# Patient Record
Sex: Female | Born: 1995 | Race: Black or African American | Hispanic: No | Marital: Single
Health system: Southern US, Community
[De-identification: ages and names within clinical notes are randomized; demographics above are authoritative.]

## PROBLEM LIST (undated history)

## (undated) ENCOUNTER — Inpatient Hospital Stay (HOSPITAL_COMMUNITY): Payer: Self-pay

## (undated) ENCOUNTER — Emergency Department (HOSPITAL_COMMUNITY): Discharge status: Absent without leave | Payer: Medicaid Other

## (undated) DIAGNOSIS — F329 Major depressive disorder, single episode, unspecified: Secondary | ICD-10-CM

## (undated) DIAGNOSIS — F32A Depression, unspecified: Secondary | ICD-10-CM

## (undated) DIAGNOSIS — D649 Anemia, unspecified: Secondary | ICD-10-CM

## (undated) DIAGNOSIS — Z789 Other specified health status: Secondary | ICD-10-CM

## (undated) HISTORY — PX: FRACTURE SURGERY: SHX138

---
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
## 2014-07-25 ENCOUNTER — Encounter (HOSPITAL_COMMUNITY): Payer: Self-pay | Admitting: *Deleted

## 2014-08-22 ENCOUNTER — Encounter (HOSPITAL_COMMUNITY): Payer: Self-pay | Admitting: *Deleted

## 2015-08-07 ENCOUNTER — Inpatient Hospital Stay (HOSPITAL_COMMUNITY)
Admission: AD | Admit: 2015-08-07 | Discharge: 2015-08-07 | Disposition: A | Discharge status: Home / Self Care | Payer: Self-pay | Source: Ambulatory Visit | Attending: Family Medicine | Admitting: Family Medicine

## 2015-08-07 ENCOUNTER — Encounter (HOSPITAL_COMMUNITY): Payer: Self-pay | Admitting: *Deleted

## 2015-08-07 DIAGNOSIS — Z5320 Procedure and treatment not carried out because of patient's decision for unspecified reasons: Secondary | ICD-10-CM | POA: Insufficient documentation

## 2015-08-07 LAB — URINALYSIS, ROUTINE W REFLEX MICROSCOPIC
Bilirubin Urine: NEGATIVE
GLUCOSE, UA: NEGATIVE mg/dL
Ketones, ur: 15 mg/dL — AB
NITRITE: NEGATIVE
PH: 6 (ref 5.0–8.0)
Protein, ur: NEGATIVE mg/dL
Specific Gravity, Urine: >1.03 — ABNORMAL HIGH (ref 1.005–1.030)
Urobilinogen, UA: 0.2 mg/dL (ref 0.0–1.0)

## 2015-08-07 LAB — URINE MICROSCOPIC-ADD ON

## 2015-08-07 LAB — POCT PREGNANCY, URINE: Preg Test, Ur: POSITIVE — AB

## 2015-08-07 NOTE — MAU Note (Signed)
Pos HPT yesterday, had been spotting & vomiting before taking test.  Spotting stopped yesterday, denies pain today.  Vomiting continues.

## 2015-08-07 NOTE — MAU Note (Signed)
Pt not in lobby, did not inform staff she was leaving. 

## 2015-08-07 NOTE — MAU Note (Signed)
Pt called, not in lobby 

## 2015-08-07 NOTE — MAU Note (Signed)
Pt not in lobby.  

## 2015-08-15 ENCOUNTER — Encounter (HOSPITAL_COMMUNITY): Payer: Self-pay | Admitting: Emergency Medicine

## 2015-08-15 ENCOUNTER — Emergency Department (HOSPITAL_COMMUNITY): Payer: Self-pay

## 2015-08-15 ENCOUNTER — Emergency Department (HOSPITAL_COMMUNITY)
Admission: EM | Admit: 2015-08-15 | Discharge: 2015-08-15 | Disposition: A | Discharge status: Home / Self Care | Payer: Self-pay | Attending: Emergency Medicine | Admitting: Emergency Medicine

## 2015-08-15 DIAGNOSIS — O9989 Other specified diseases and conditions complicating pregnancy, childbirth and the puerperium: Secondary | ICD-10-CM | POA: Insufficient documentation

## 2015-08-15 DIAGNOSIS — Z3A01 Less than 8 weeks gestation of pregnancy: Secondary | ICD-10-CM | POA: Insufficient documentation

## 2015-08-15 DIAGNOSIS — O210 Mild hyperemesis gravidarum: Secondary | ICD-10-CM | POA: Insufficient documentation

## 2015-08-15 DIAGNOSIS — R197 Diarrhea, unspecified: Secondary | ICD-10-CM | POA: Insufficient documentation

## 2015-08-15 DIAGNOSIS — O26899 Other specified pregnancy related conditions, unspecified trimester: Secondary | ICD-10-CM

## 2015-08-15 DIAGNOSIS — R109 Unspecified abdominal pain: Secondary | ICD-10-CM

## 2015-08-15 DIAGNOSIS — Z3490 Encounter for supervision of normal pregnancy, unspecified, unspecified trimester: Secondary | ICD-10-CM

## 2015-08-15 LAB — COMPREHENSIVE METABOLIC PANEL
ALT: 12 U/L — AB (ref 14–54)
AST: 20 U/L (ref 15–41)
Albumin: 4 g/dL (ref 3.5–5.0)
Alkaline Phosphatase: 56 U/L (ref 38–126)
Anion gap: 11 (ref 5–15)
BUN: <5 mg/dL — ABNORMAL LOW (ref 6–20)
CALCIUM: 9.8 mg/dL (ref 8.9–10.3)
CO2: 22 mmol/L (ref 22–32)
Chloride: 104 mmol/L (ref 101–111)
Creatinine, Ser: 0.57 mg/dL (ref 0.44–1.00)
GFR calc Af Amer: >60 mL/min (ref >60)
GLUCOSE: 111 mg/dL — AB (ref 65–99)
Potassium: 3.9 mmol/L (ref 3.5–5.1)
Sodium: 137 mmol/L (ref 135–145)
Total Bilirubin: 0.3 mg/dL (ref 0.3–1.2)
Total Protein: 7.5 g/dL (ref 6.5–8.1)

## 2015-08-15 LAB — CBC
HEMATOCRIT: 37.6 % (ref 36.0–46.0)
Hemoglobin: 12.7 g/dL (ref 12.0–15.0)
MCH: 29.3 pg (ref 26.0–34.0)
MCHC: 33.8 g/dL (ref 30.0–36.0)
MCV: 86.6 fL (ref 78.0–100.0)
Platelets: 413 10*3/uL — ABNORMAL HIGH (ref 150–400)
RBC: 4.34 MIL/uL (ref 3.87–5.11)
RDW: 13 % (ref 11.5–15.5)
WBC: 14.8 10*3/uL — ABNORMAL HIGH (ref 4.0–10.5)

## 2015-08-15 LAB — URINALYSIS, ROUTINE W REFLEX MICROSCOPIC
Bilirubin Urine: NEGATIVE
GLUCOSE, UA: NEGATIVE mg/dL
HGB URINE DIPSTICK: NEGATIVE
Ketones, ur: >80 mg/dL — AB
Leukocytes, UA: NEGATIVE
Nitrite: NEGATIVE
PH: 6 (ref 5.0–8.0)
Protein, ur: 30 mg/dL — AB
SPECIFIC GRAVITY, URINE: 1.031 — AB (ref 1.005–1.030)
Urobilinogen, UA: 0.2 mg/dL (ref 0.0–1.0)

## 2015-08-15 LAB — ABO/RH: ABO/RH(D): O POS

## 2015-08-15 LAB — I-STAT BETA HCG BLOOD, ED (MC, WL, AP ONLY): I-stat hCG, quantitative: >2000 m[IU]/mL — ABNORMAL HIGH (ref <5)

## 2015-08-15 LAB — URINE MICROSCOPIC-ADD ON

## 2015-08-15 LAB — LIPASE, BLOOD: Lipase: 22 U/L (ref 11–51)

## 2015-08-15 MED ORDER — SODIUM CHLORIDE 0.9 % IV BOLUS (SEPSIS)
1000.0000 mL | Freq: Once | INTRAVENOUS | Status: AC
  Administered 2015-08-15: 1000 mL via INTRAVENOUS

## 2015-08-15 MED ORDER — PROCHLORPERAZINE EDISYLATE 5 MG/ML IJ SOLN
10.0000 mg | Freq: Once | INTRAMUSCULAR | Status: AC
Start: 2015-08-15 — End: 2015-08-15
  Administered 2015-08-15: 10 mg via INTRAVENOUS
  Filled 2015-08-15: qty 2

## 2015-08-15 MED ORDER — PRENATAL PLUS 27-1 MG PO TABS: 1.0000 | ORAL_TABLET | Freq: Every day | ORAL | Status: DC

## 2015-08-15 MED ORDER — DIPHENHYDRAMINE HCL 50 MG/ML IJ SOLN
50.0000 mg | Freq: Once | INTRAMUSCULAR | Status: AC
  Administered 2015-08-15: 50 mg via INTRAVENOUS
  Filled 2015-08-15: qty 1

## 2015-08-15 NOTE — ED Notes (Signed)
Pt sts abd pain cramping with N/V/D starting today

## 2015-08-15 NOTE — ED Provider Notes (Signed)
CSN: [id number]     Arrival date & time 08/15/15  1507 History   First MD Initiated Contact with Patient 08/15/15 1620     Chief Complaint  Patient presents with  . Emesis  . Diarrhea   HPI  Patient process emergency department for 1 day of emesis and nausea with associated loose stool. Patient states she has been pregnant 2 other times with one preterm delivery that is currently living.  Patient states she has no other pertinent medical history no cancers this time no abdominal pain no back pain or flank pain no chest pain no shortness of breath patient denies fever and chills. Patient sees Dr. Clearance CootsHarper and Dr. Gaynell FaceMarshall for 2 prior pregnancies. Patient states she has tried by mouth fluids however this has been unable to stop emesis. Emesis is described as without blood or bile. And contains stomach contents. Patient had no elevated blood pressure or severe emesis episodes during prior pregnancies.  Past Medical History  Diagnosis Date  . No pertinent past medical history   . Medical history non-contributory    Past Surgical History  Procedure Laterality Date  . Fracture surgery     History reviewed. No pertinent family history. Social History  Substance Use Topics  . Smoking status: Never Smoker   . Smokeless tobacco: Never Used  . Alcohol Use: No   OB History    Gravida Para Term Preterm AB TAB SAB Ectopic Multiple Living   3 1  1      1      Review of Systems Endorses only nausea and emesis. Complete Review of Systems obtained and all other systems negative.   Allergies  Review of patient's allergies indicates no known allergies.  Home Medications   Prior to Admission medications   Medication Sig Start Date End Date Taking? Authorizing Provider  prenatal vitamin w/FE, FA (PRENATAL 1 + 1) 27-1 MG TABS tablet Take 1 tablet by mouth daily. 08/15/15   Jonette EvaBrad Eavan Gonterman, MD  promethazine (PHENERGAN) 12.5 MG tablet Take 1 tablet (12.5 mg total) by mouth every 6 (six) hours as needed  for nausea or vomiting. 09/19/13   Deirdre C Poe, CNM   BP 110/59 mmHg  Pulse 76  Temp(Src) 98.2 F (36.8 C) (Oral)  Resp 16  SpO2 98%  LMP 07/01/2015 Physical Exam  Constitutional: She is oriented to person, place, and time. She appears well-developed and well-nourished.  Non-toxic appearance. She does not appear ill. No distress.  HENT:  Head: Normocephalic and atraumatic.  Right Ear: External ear normal.  Left Ear: External ear normal.  Mouth/Throat: Oropharynx is clear and moist.  Eyes: Pupils are equal, round, and reactive to light. No scleral icterus.  Neck: Normal range of motion. Neck supple. No tracheal deviation present.  Cardiovascular: Normal heart sounds and intact distal pulses.   No murmur heard. Pulmonary/Chest: Effort normal and breath sounds normal. No stridor. No respiratory distress. She has no wheezes. She has no rales.  Abdominal: Soft. Bowel sounds are normal. She exhibits no distension. There is no tenderness. There is no rebound and no guarding.  Musculoskeletal: Normal range of motion.  Neurological: She is alert and oriented to person, place, and time. She has normal strength and normal reflexes. No cranial nerve deficit or sensory deficit.  Skin: Skin is warm and dry. No pallor.  Psychiatric: She has a normal mood and affect. Her behavior is normal.   ED Course  Procedures (including critical care time) Labs Review Labs Reviewed  COMPREHENSIVE METABOLIC  PANEL - Abnormal; Notable for the following:    Glucose, Bld 111 (*)    BUN <5 (*)    ALT 12 (*)    All other components within normal limits  CBC - Abnormal; Notable for the following:    WBC 14.8 (*)    Platelets 413 (*)    All other components within normal limits  URINALYSIS, ROUTINE W REFLEX MICROSCOPIC (NOT AT [facility]) - Abnormal; Notable for the following:    APPearance CLOUDY (*)    Specific Gravity, Urine 1.031 (*)    Ketones, ur >80 (*)    Protein, ur 30 (*)    All other components within  normal limits  I-STAT BETA HCG BLOOD, ED ([facility], [facility], [facility] ONLY) - Abnormal; Notable for the following:    I-stat hCG, quantitative >2000.0 (*)    All other components within normal limits  LIPASE, BLOOD  URINE MICROSCOPIC-ADD ON  ABO/RH    Imaging Review US Ob Comp Less 14 Wks  08/15/2015  CLINICAL DATA:  Abdominal cramping affecting pregnancy. Patient is 5 weeks and 3 days pregnant based on her last menstrual period. EXAM: OBSTETRIC <14 WK [place] AND TRANSVAGINAL OB US TECHNIQUE: Both transabdominal and transvaginal ultrasound examinations were performed for complete evaluation of the gestation as well as the maternal uterus, adnexal regions, and pelvic cul-de-sac. Transvaginal technique was performed to assess early pregnancy. COMPARISON:  None. FINDINGS: Intrauterine gestational sac: Visualized/normal in shape. Yolk sac:  Yes Embryo:  Yes Cardiac Activity: Yes Heart Rate: 144  bpm CRL:  9.9 mm  mm   7 w   0 d                  [place] EDC: 04/02/2016 Maternal uterus/adnexae: Small subchronic hemorrhage measuring 11 x 5 x 9 mm. No uterine masses. Cervix is unremarkable. Normal ovaries. Trace pelvic free fluid. IMPRESSION: 1. Single live anterior pregnancy with a measured gestational age of [redacted] weeks. 2. Small subchronic hemorrhage.  No other pregnancy complication. 3. Normal ovaries and adnexa. Electronically Signed   By: Amie Portland M.D.   On: 08/15/2015 18:50   US Ob Transvaginal  08/15/2015  CLINICAL DATA:  Abdominal cramping affecting pregnancy. Patient is 5 weeks and 3 days pregnant based on her last menstrual period. EXAM: OBSTETRIC <14 WK [place] AND TRANSVAGINAL OB US TECHNIQUE: Both transabdominal and transvaginal ultrasound examinations were performed for complete evaluation of the gestation as well as the maternal uterus, adnexal regions, and pelvic cul-de-sac. Transvaginal technique was performed to assess early pregnancy. COMPARISON:  None. FINDINGS: Intrauterine gestational sac: Visualized/normal in  shape. Yolk sac:  Yes Embryo:  Yes Cardiac Activity: Yes Heart Rate: 144  bpm CRL:  9.9 mm  mm   7 w   0 d                  [place] EDC: 04/02/2016 Maternal uterus/adnexae: Small subchronic hemorrhage measuring 11 x 5 x 9 mm. No uterine masses. Cervix is unremarkable. Normal ovaries. Trace pelvic free fluid. IMPRESSION: 1. Single live anterior pregnancy with a measured gestational age of [redacted] weeks. 2. Small subchronic hemorrhage.  No other pregnancy complication. 3. Normal ovaries and adnexa. Electronically Signed   By: Amie Portland M.D.   On: 08/15/2015 18:50   I have personally reviewed and evaluated these images and lab results as part of my medical decision-making.   EKG Interpretation None      MDM   Sandra Matthews is a 19 y.o. female  with H&P as above. ED clinical course as follows:  patient is afebrile and non-toxic appearing. VS HDS. No DM hx therefore DKA not likely leukocytosis likely stress response to repeated emesis.   Exam without guarding/rebound therefore do not suspect appendicitis or diverticulitis.  No vaginal d/c.  Unlikely PID or TOA given reassuring H&P. Emesis visualized which has no bloody or bilious components.  Urine pregnancy positive. Patient's last menstrual period was 07/01/2015.   Pelvic US performed for location of pregnancy and dating. It confirms intrauterine pregnancy therefore do not suspect ectopic given no stimulation therapy.  Shows single live pregnancy with a measured gestational age of [redacted] weeks. Small subchronic hemorrhage. No other pregnancy complication. Normal ovaries and adnexa.  UA without evidence of infection therefore do not suspect pyelonephritis.  Symptomatic treatment provided with IV compazine and benadryl which resulted in complete resolution of symptoms. Patient well appearing without further endorsements.  Clinical Impression:  1. Pregnancy   2. Cramping affecting pregnancy, antepartum    Disposition:  I explained the  differential diagnoses along with likely diagnosis and have given explicit precautions to return to the ER including any other new or worsening symptoms. The patient understands and I both nursing and I confirmed there are no further concerns or questions. Discharge instructions concerning symptomatic care and follow up have been given. Patient told she must seek immediate prenatal care and to avoid all toxins that could prove harmful to pregnancy especially alcohol or illicit drugs. Told to discuss all home medications with their prescribing providers. Given Rx for prenatal vitamins. The patient is STABLE and is discharged to home in good condition.  Patient care discussed with Dr. Denton Lank, who oversaw their evaluation & treatment & voiced agreement. House Officer: Jonette Eva, MD, Emergency Medicine.   Jonette Eva, MD 08/16/15 1610  Cathren Laine, MD 08/16/15 [phone]

## 2015-09-21 ENCOUNTER — Encounter: Payer: Self-pay | Admitting: Obstetrics

## 2015-09-22 ENCOUNTER — Other Ambulatory Visit: Payer: Self-pay | Admitting: Certified Nurse Midwife

## 2015-09-28 ENCOUNTER — Encounter (HOSPITAL_COMMUNITY): Payer: Self-pay

## 2015-09-28 ENCOUNTER — Inpatient Hospital Stay (HOSPITAL_COMMUNITY)
Admission: AD | Admit: 2015-09-28 | Discharge: 2015-09-28 | Disposition: A | Discharge status: Home / Self Care | Payer: Medicaid Other | Source: Ambulatory Visit | Attending: Obstetrics & Gynecology | Admitting: Obstetrics & Gynecology

## 2015-09-28 DIAGNOSIS — O219 Vomiting of pregnancy, unspecified: Secondary | ICD-10-CM

## 2015-09-28 DIAGNOSIS — Z3A12 12 weeks gestation of pregnancy: Secondary | ICD-10-CM | POA: Diagnosis not present

## 2015-09-28 DIAGNOSIS — O210 Mild hyperemesis gravidarum: Secondary | ICD-10-CM | POA: Insufficient documentation

## 2015-09-28 LAB — URINALYSIS, ROUTINE W REFLEX MICROSCOPIC
Glucose, UA: NEGATIVE mg/dL
Hgb urine dipstick: NEGATIVE
Ketones, ur: >80 mg/dL — AB
LEUKOCYTES UA: NEGATIVE
NITRITE: NEGATIVE
Protein, ur: 30 mg/dL — AB
pH: 6 (ref 5.0–8.0)

## 2015-09-28 LAB — URINE MICROSCOPIC-ADD ON

## 2015-09-28 MED ORDER — PROMETHAZINE HCL 25 MG PO TABS: 12.5000 mg | ORAL_TABLET | Freq: Four times a day (QID) | ORAL | Status: DC | PRN

## 2015-09-28 MED ORDER — LACTATED RINGERS IV BOLUS (SEPSIS)
1000.0000 mL | Freq: Once | INTRAVENOUS | Status: AC
  Administered 2015-09-28: 1000 mL via INTRAVENOUS

## 2015-09-28 MED ORDER — GLYCOPYRROLATE 1 MG PO TABS: 1.0000 mg | ORAL_TABLET | Freq: Three times a day (TID) | ORAL | Status: DC

## 2015-09-28 MED ORDER — PROMETHAZINE HCL 25 MG/ML IJ SOLN
25.0000 mg | Freq: Once | INTRAVENOUS | Status: AC
  Administered 2015-09-28: 25 mg via INTRAVENOUS
  Filled 2015-09-28: qty 1

## 2015-09-28 MED ORDER — GLYCOPYRROLATE 1 MG PO TABS
1.0000 mg | ORAL_TABLET | Freq: Once | ORAL | Status: AC
  Administered 2015-09-28: 1 mg via ORAL
  Filled 2015-09-28: qty 1

## 2015-09-28 NOTE — MAU Note (Signed)
Pt reports she has been vomiting for the last 4-5 days, unable to keep anything down. Also having lower abd pain off/on.

## 2015-09-28 NOTE — MAU Note (Signed)
PO fluids tolerated well, pt asking to be discharged. Discharge instructions given to patient-pt verbalizes understanding

## 2015-09-28 NOTE — MAU Note (Signed)
Pt states nausea much improved after IVF's with phenergan. Ginger ale given.

## 2015-09-28 NOTE — MAU Provider Note (Signed)
History     CSN: [id number]  Arrival date and time: 09/28/15 0125   First Provider Initiated Contact with Patient 09/28/15 0154      Chief Complaint  Patient presents with  . Emesis During Pregnancy   HPI Comments: Sandra Matthews is a 19 y.o. G2P0101 at 670w5d who presents today with nausea and vomiting. She states that for the last week she has been vomiting "all day every day". She states that she has had increased spitting. She has not tried any medications at this time.   Emesis  This is a new problem. The problem occurs more than 10 times per day. The problem has been unchanged. The emesis has an appearance of stomach contents. There has been no fever. Associated symptoms include diarrhea (2x day. Soft stools. ). Pertinent negatives include no abdominal pain, chills or fever. Risk factors: pregnancy  She has tried nothing for the symptoms.     Past Medical History  Diagnosis Date  . No pertinent past medical history   . Medical history non-contributory     Past Surgical History  Procedure Laterality Date  . Fracture surgery      History reviewed. No pertinent family history.  Social History  Substance Use Topics  . Smoking status: Never Smoker   . Smokeless tobacco: Never Used  . Alcohol Use: No    Allergies: No Known Allergies  Prescriptions prior to admission  Medication Sig Dispense Refill Last Dose  . prenatal vitamin w/FE, FA (PRENATAL 1 + 1) 27-1 MG TABS tablet Take 1 tablet by mouth daily. 30 each 0 Past Month at Unknown time  . promethazine (PHENERGAN) 12.5 MG tablet Take 1 tablet (12.5 mg total) by mouth every 6 (six) hours as needed for nausea or vomiting. 30 tablet 0     Review of Systems  Constitutional: Negative for fever and chills.  Gastrointestinal: Positive for nausea, vomiting and diarrhea (2x day. Soft stools. ). Negative for abdominal pain and constipation.  Genitourinary: Negative for dysuria, urgency and frequency.   Physical Exam    Blood pressure 126/72, pulse 98, temperature 98.8 F (37.1 C), temperature source Oral, resp. rate 16, height 5\' 1"  (1.549 m), weight 55.339 kg (122 lb), last menstrual period 07/01/2015, SpO2 97 %, unknown if currently breastfeeding.  Physical Exam  Nursing note and vitals reviewed. Constitutional: She is oriented to person, place, and time. She appears well-developed and well-nourished. No distress.  HENT:  Head: Normocephalic.  Cardiovascular: Normal rate.   Respiratory: Effort normal.  GI: Soft. There is no tenderness.  Neurological: She is oriented to person, place, and time.  Skin: Skin is warm.  Psychiatric: She has a normal mood and affect.   FHT: 154 with doppler    Results for orders placed or performed during the hospital encounter of 09/28/15 (from the past 24 hour(s))  Urinalysis, Routine w reflex microscopic (not at [facility])     Status: Abnormal   Collection Time: 09/28/15  1:25 AM  Result Value Ref Range   Color, Urine YELLOW YELLOW   APPearance CLEAR CLEAR   Specific Gravity, Urine >1.030 (H) 1.005 - 1.030   pH 6.0 5.0 - 8.0   Glucose, UA NEGATIVE NEGATIVE mg/dL   Hgb urine dipstick NEGATIVE NEGATIVE   Bilirubin Urine SMALL (A) NEGATIVE   Ketones, ur >80 (A) NEGATIVE mg/dL   Protein, ur 30 (A) NEGATIVE mg/dL   Nitrite NEGATIVE NEGATIVE   Leukocytes, UA NEGATIVE NEGATIVE  Urine microscopic-add on  Status: Abnormal   Collection Time: 09/28/15  1:25 AM  Result Value Ref Range   Squamous Epithelial / LPF 0-5 (A) NONE SEEN   WBC, UA 0-5 0 - 5 WBC/hpf   RBC / HPF 0-5 0 - 5 RBC/hpf   Bacteria, UA FEW (A) NONE SEEN   Urine-Other MUCOUS PRESENT     MAU Course  Procedures  MDM Patient had 1L of D5LR with phenergan, and 1L of LR. She has also had a dose or robinul. She reports feeling better, and is tolerating PO foods and fluids at this time.   Assessment and Plan   1. Nausea/vomiting in pregnancy   2. [redacted] weeks gestation of pregnancy    DC home Comfort  measures reviewed  1st Trimester precautions  RX: phenergan PRN #[id number] with 1 RF, Robinul TID #[id number]  Return to MAU as needed FU with OB as planned  Follow-up Information    Schedule an appointment as soon as possible for a visit with HARPER,CHARLES A, MD.   Specialty:  Obstetrics and Gynecology   Contact information:   [address] [place] [address] [phone]        Tawnya Crook 09/28/2015, 1:56 AM

## 2015-09-28 NOTE — Discharge Instructions (Signed)

## 2015-10-22 NOTE — L&D Delivery Note (Signed)
Delivery Note At 2:51 PM a viable female was delivered via Vaginal, Spontaneous Delivery (Presentation: LOA ; Occiput Anterior).  APGAR: 9, 9; weight  .   Placenta status: Intact, Spontaneous.  Cord: 3 vessels with the following complications: None.  Cord pH: none  Anesthesia: None  Episiotomy: None Lacerations: None Suture Repair: None Est. Blood Loss (mL): 350  Mom to postpartum floor.  Baby to Couplet care / Skin to Skin.  Ariela Mochizuki A 03/24/2016, 3:49 PM

## 2015-11-16 ENCOUNTER — Ambulatory Visit (INDEPENDENT_AMBULATORY_CARE_PROVIDER_SITE_OTHER): Payer: Medicaid Other | Admitting: Certified Nurse Midwife

## 2015-11-16 ENCOUNTER — Encounter: Payer: Self-pay | Admitting: Certified Nurse Midwife

## 2015-11-16 VITALS — BP 113/69 | HR 94 | Temp 98.5°F | Wt 128.0 lb

## 2015-11-16 DIAGNOSIS — O0992 Supervision of high risk pregnancy, unspecified, second trimester: Secondary | ICD-10-CM

## 2015-11-16 DIAGNOSIS — O09892 Supervision of other high risk pregnancies, second trimester: Secondary | ICD-10-CM

## 2015-11-16 DIAGNOSIS — O09212 Supervision of pregnancy with history of pre-term labor, second trimester: Secondary | ICD-10-CM | POA: Diagnosis not present

## 2015-11-16 DIAGNOSIS — O0932 Supervision of pregnancy with insufficient antenatal care, second trimester: Secondary | ICD-10-CM | POA: Diagnosis not present

## 2015-11-16 LAB — POCT URINALYSIS DIPSTICK
BILIRUBIN UA: NEGATIVE
GLUCOSE UA: NEGATIVE
Ketones, UA: NEGATIVE
Leukocytes, UA: NEGATIVE
NITRITE UA: NEGATIVE
PH UA: 6
RBC UA: NEGATIVE
SPEC GRAV UA: 1.02
Urobilinogen, UA: NEGATIVE

## 2015-11-16 MED ORDER — ONDANSETRON HCL 4 MG PO TABS: 4.0000 mg | ORAL_TABLET | Freq: Every day | ORAL | Status: DC | PRN

## 2015-11-16 MED ORDER — VITAFOL GUMMIES 3.33-0.333-34.8 MG PO CHEW: 3.0000 | CHEWABLE_TABLET | Freq: Every day | ORAL | Status: DC

## 2015-11-16 MED ORDER — BUTALBITAL-APAP-CAFFEINE 50-325-40 MG PO TABS: 1.0000 | ORAL_TABLET | Freq: Four times a day (QID) | ORAL | Status: DC | PRN

## 2015-11-16 MED ORDER — DIPHENHYDRAMINE HCL 25 MG PO TABS: 50.0000 mg | ORAL_TABLET | Freq: Every day | ORAL | Status: DC

## 2015-11-16 NOTE — Progress Notes (Signed)
Subjective:    Sandra Matthews is being seen today for her first obstetrical visit.  This is not a planned pregnancy. She is at [redacted]w[redacted]d gestation. Her obstetrical history is significant for preterm delivery with around a 1 month admission to NICU. Relationship with FOB: significant other, living together. Patient does intend to breast feed. Pregnancy history fully reviewed.  Smokes mariajuana about two times a week.  Reports daily HA, has not tried Tylenol.    The information documented in the HPI was reviewed and verified.  Menstrual History: OB History    Gravida Para Term Preterm AB TAB SAB Ectopic Multiple Living        Menarche age: 38-28 years of age.   Patient's last menstrual period was 07/01/2015.    Past Medical History  Diagnosis Date  . No pertinent past medical history   . Medical history non-contributory     Past Surgical History  Procedure Laterality Date  . Fracture surgery       (Not in a hospital admission) No Known Allergies  Social History  Substance Use Topics  . Smoking status: Never Smoker   . Smokeless tobacco: Never Used  . Alcohol Use: No    Family History  Problem Relation Age of Onset  . Bipolar disorder Father   . Depression Father   . Anxiety disorder Father      Review of Systems Constitutional: negative for weight loss Gastrointestinal: + for nausea & vomiting Genitourinary:negative for genital lesions and vaginal discharge and dysuria Musculoskeletal:negative for back pain Behavioral/Psych: negative for abusive relationship, depression, illegal drug usage and tobacco use    Objective:    BP 113/69 mmHg  Pulse 94  Temp(Src) 98.5 F (36.9 C)  Wt 128 lb (58.06 kg)  LMP 07/01/2015 General Appearance:    Alert, cooperative, no distress, appears stated age  Head:    Normocephalic, without obvious abnormality, atraumatic  Eyes:    PERRL, conjunctiva/corneas clear, EOM's intact, fundi    benign, both eyes   Ears:    Normal TM's and external ear canals, both ears  Nose:   Nares normal, septum midline, mucosa normal, no drainage    or sinus tenderness  Throat:   Lips, mucosa, and tongue normal; teeth and gums normal  Neck:   Supple, symmetrical, trachea midline, no adenopathy;    thyroid:  no enlargement/tenderness/nodules; no carotid   bruit or JVD  Back:     Symmetric, no curvature, ROM normal, no CVA tenderness  Lungs:     Clear to auscultation bilaterally, respirations unlabored  Chest Wall:    No tenderness or deformity   Heart:    Regular rate and rhythm, S1 and S2 normal, no murmur, rub   or gallop  Breast Exam:    No tenderness, masses, or nipple abnormality  Abdomen:     Soft, non-tender, bowel sounds active all four quadrants,    no masses, no organomegaly  Genitalia:    Normal female without lesion, discharge or tenderness  Extremities:   Extremities normal, atraumatic, no cyanosis or edema  Pulses:   2+ and symmetric all extremities  Skin:   Skin color, texture, turgor normal, no rashes or lesions  Lymph nodes:   Cervical, supraclavicular, and axillary nodes normal  Neurologic:   CNII-XII intact, normal strength, sensation and reflexes    throughout     Cervix:  Short, thin, about 0.5cm dilated & posterior   FHR:  145    Lab Review Urine pregnancy test Labs reviewed yes Radiologic studies reviewed yes Assessment:    Pregnancy at [redacted]w[redacted]d weeks   H/O PTD  weeks  Late to prenatal care  HA in pregnancy  Plan:      Prenatal vitamins.  Counseling provided regarding continued use of seat belts, cessation of alcohol consumption, smoking or use of illicit drugs; infection precautions i.e., influenza/TDAP immunizations, toxoplasmosis,CMV, parvovirus, listeria and varicella; workplace safety, exercise during pregnancy; routine dental care, safe medications, sexual activity, hot tubs, saunas, pools, travel, caffeine use, fish and methlymercury, potential toxins, hair treatments,  varicose veins Weight gain recommendations per IOM guidelines reviewed: underweight/BMI< 18.5--> gain 28 - 40 lbs; normal weight/BMI 18.5 - 24.9--> gain 25 - 35 lbs; overweight/BMI 25 - 29.9--> gain 15 - 25 lbs; obese/BMI >30->gain  11 - 20 lbs Problem list reviewed and updated. FIRST/CF mutation testing/NIPT/QUAD SCREEN/fragile X/Ashkenazi Jewish population testing/Spinal muscular atrophy discussed: requested. Role of ultrasound in pregnancy discussed; fetal survey: requested. Amniocentesis discussed: not indicated. VBAC calculator score: VBAC consent form provided Meds ordered this encounter  Medications  . ondansetron (ZOFRAN) 4 MG tablet    Sig: Take 1 tablet (4 mg total) by mouth daily as needed.    Dispense:  30 tablet    Refill:  1  . Prenatal Vit-Fe Phos-FA-Omega (VITAFOL GUMMIES) 3.33-0.333-34.8 MG CHEW    Sig: Chew 3 tablets by mouth daily.    Dispense:  90 tablet    Refill:  12  . diphenhydrAMINE (BENADRYL) 25 MG tablet    Sig: Take 2 tablets (50 mg total) by mouth at bedtime.    Dispense:  90 tablet    Refill:  4  . butalbital-acetaminophen-caffeine (FIORICET) 50-325-40 MG tablet    Sig: Take 1-2 tablets by mouth every 6 (six) hours as needed for headache.    Dispense:  45 tablet    Refill:  4   Orders Placed This Encounter  Procedures  . Culture, OB Urine  . SureSwab, Vaginosis/Vaginitis Plus  . [place] MFM OB DETAIL +14 WK    Standing Status: Future     Number of Occurrences:      Standing Expiration Date: 01/13/2017    Order Specific Question:  Reason for Exam (SYMPTOM  OR DIAGNOSIS REQUIRED)    Answer:  fetal anatomy scan    Order Specific Question:  Preferred imaging location?    Answer:  [facility]  . Obstetric panel  . HIV antibody  . Hemoglobinopathy evaluation  . Varicella zoster antibody, IgG  . VITAMIN D 25 Hydroxy (Vit-D Deficiency, Fractures)  . AFP, Quad Screen    Order Specific Question:  Repeat Sample    Answer:  No    Order Specific Question:   Maternal Race    Answer:  african american    Order Specific Question:  EDD    Answer:  04/06/2016    Order Specific Question:  Brief History NTD    Answer:  no    Order Specific Question:  Pregnancy Donor Egg (Y/N)    Answer:  No    Order Specific Question:  Gest Age at U/S (Wk.Dy)    Answer:  7.0    Order Specific Question:  LMP:    Answer:  07/01/2015    Order Specific Question:  Number of Fetuses    Answer:  1    Order Specific Question:  Ultrasound Date    Answer:  08/15/2015    Order Specific Question:  Hx of OSB/NTD?  Answer:  No    Order Specific Question:  History of Down Syndrome?    Answer:  No    Order Specific Question:  Maternal IDDM (insulin-dependent diabetes mellitus)    Answer:  No    Order Specific Question:  Maternal Weight (lbs)    Answer:  128  . AMB referral to maternal fetal medicine    Referral Priority:  Routine    Referral Type:  Consultation    Referral Reason:  Specialty Services Required    Number of Visits Requested:  1  . POCT urinalysis dipstick    Follow up in 2 weeks. 50% of 30 min visit spent on counseling and coordination of care.

## 2015-11-17 LAB — OBSTETRIC PANEL
ANTIBODY SCREEN: NEGATIVE
BASOS ABS: 0 10*3/uL (ref 0.0–0.1)
BASOS PCT: 0 % (ref 0–1)
EOS PCT: 1 % (ref 0–5)
Eosinophils Absolute: 0.2 10*3/uL (ref 0.0–0.7)
HEMATOCRIT: 34.8 % — AB (ref 36.0–46.0)
HEP B S AG: NEGATIVE
Hemoglobin: 11.4 g/dL — ABNORMAL LOW (ref 12.0–15.0)
Lymphocytes Relative: 18 % (ref 12–46)
Lymphs Abs: 2.8 10*3/uL (ref 0.7–4.0)
MCH: 29.9 pg (ref 26.0–34.0)
MCHC: 32.8 g/dL (ref 30.0–36.0)
MCV: 91.3 fL (ref 78.0–100.0)
MONO ABS: 1.3 10*3/uL — AB (ref 0.1–1.0)
MPV: 9.7 fL (ref 8.6–12.4)
Monocytes Relative: 8 % (ref 3–12)
Neutro Abs: 11.5 10*3/uL — ABNORMAL HIGH (ref 1.7–7.7)
Neutrophils Relative %: 73 % (ref 43–77)
Platelets: 418 10*3/uL — ABNORMAL HIGH (ref 150–400)
RBC: 3.81 MIL/uL — AB (ref 3.87–5.11)
RDW: 13.6 % (ref 11.5–15.5)
RH TYPE: POSITIVE
Rubella: 3.06 Index — ABNORMAL HIGH (ref <0.90)
WBC: 15.8 10*3/uL — AB (ref 4.0–10.5)

## 2015-11-17 LAB — VITAMIN D 25 HYDROXY (VIT D DEFICIENCY, FRACTURES): Vit D, 25-Hydroxy: 16 ng/mL — ABNORMAL LOW (ref 30–100)

## 2015-11-17 LAB — HIV ANTIBODY (ROUTINE TESTING W REFLEX): HIV 1&2 Ab, 4th Generation: NONREACTIVE

## 2015-11-17 LAB — VARICELLA ZOSTER ANTIBODY, IGG: VARICELLA IGG: 2021 {index} — AB (ref <135.00)

## 2015-11-18 DIAGNOSIS — O09892 Supervision of other high risk pregnancies, second trimester: Secondary | ICD-10-CM | POA: Insufficient documentation

## 2015-11-18 DIAGNOSIS — O09212 Supervision of pregnancy with history of pre-term labor, second trimester: Secondary | ICD-10-CM

## 2015-11-18 DIAGNOSIS — O0930 Supervision of pregnancy with insufficient antenatal care, unspecified trimester: Secondary | ICD-10-CM | POA: Insufficient documentation

## 2015-11-18 LAB — CULTURE, OB URINE
COLONY COUNT: NO GROWTH
ORGANISM ID, BACTERIA: NO GROWTH

## 2015-11-20 LAB — AFP, QUAD SCREEN
AFP: 87.5 ng/mL
Age Alone: 1:1170 {titer}
Curr Gest Age: 20.2 wks.days
Down Syndrome Scr Risk Est: 1:12600 {titer}
HCG, Total: 16.4 IU/mL
INH: 358 pg/mL
Interpretation-AFP: NEGATIVE
MOM FOR HCG: 0.67
MOM FOR INH: 1.86
MoM for AFP: 1.21
OPEN SPINA BIFIDA: NEGATIVE
Osb Risk: 1:13300 {titer}
TRI 18 SCR RISK EST: NEGATIVE
Trisomy 18 (Edward) Syndrome Interp.: 1:40600 {titer}
uE3 Mom: 0.91
uE3 Value: 1.81 ng/mL

## 2015-11-20 LAB — HEMOGLOBINOPATHY EVALUATION
HEMOGLOBIN OTHER: 0 %
HGB A2 QUANT: 2.8 % (ref 2.2–3.2)
HGB F QUANT: 0.7 % (ref 0.0–2.0)
Hgb A: 96.5 % — ABNORMAL LOW (ref 96.8–97.8)
Hgb S Quant: 0 %

## 2015-11-21 ENCOUNTER — Other Ambulatory Visit: Payer: Self-pay | Admitting: Certified Nurse Midwife

## 2015-11-21 DIAGNOSIS — B9689 Other specified bacterial agents as the cause of diseases classified elsewhere: Secondary | ICD-10-CM

## 2015-11-21 DIAGNOSIS — O23592 Infection of other part of genital tract in pregnancy, second trimester: Secondary | ICD-10-CM

## 2015-11-21 DIAGNOSIS — O98812 Other maternal infectious and parasitic diseases complicating pregnancy, second trimester: Principal | ICD-10-CM

## 2015-11-21 DIAGNOSIS — A749 Chlamydial infection, unspecified: Secondary | ICD-10-CM

## 2015-11-21 DIAGNOSIS — N760 Acute vaginitis: Secondary | ICD-10-CM

## 2015-11-21 LAB — SURESWAB, VAGINOSIS/VAGINITIS PLUS
Atopobium vaginae: 6.6 Log (cells/mL)
C. ALBICANS, DNA: DETECTED — AB
C. GLABRATA, DNA: NOT DETECTED
C. PARAPSILOSIS, DNA: NOT DETECTED
C. TRACHOMATIS RNA, TMA: DETECTED — AB
C. TROPICALIS, DNA: NOT DETECTED
LACTOBACILLUS SPECIES: NOT DETECTED Log (cells/mL)
MEGASPHAERA SPECIES: >8 Log (cells/mL)
N. gonorrhoeae RNA, TMA: NOT DETECTED
T. VAGINALIS RNA, QL TMA: NOT DETECTED

## 2015-11-21 MED ORDER — TERCONAZOLE 0.4 % VA CREA: 1.0000 | TOPICAL_CREAM | Freq: Every day | VAGINAL | Status: DC

## 2015-11-21 MED ORDER — AZITHROMYCIN 250 MG PO TABS: ORAL_TABLET | ORAL | Status: DC

## 2015-11-21 MED ORDER — FLUCONAZOLE 100 MG PO TABS: 100.0000 mg | ORAL_TABLET | Freq: Once | ORAL | Status: DC

## 2015-11-21 MED ORDER — TINIDAZOLE 500 MG PO TABS: 2.0000 g | ORAL_TABLET | Freq: Every day | ORAL | Status: AC

## 2015-11-28 ENCOUNTER — Other Ambulatory Visit: Payer: Self-pay | Admitting: Certified Nurse Midwife

## 2015-11-28 ENCOUNTER — Ambulatory Visit (INDEPENDENT_AMBULATORY_CARE_PROVIDER_SITE_OTHER): Payer: Medicaid Other | Admitting: Certified Nurse Midwife

## 2015-11-28 ENCOUNTER — Ambulatory Visit (HOSPITAL_COMMUNITY)
Admission: RE | Admit: 2015-11-28 | Discharge: 2015-11-28 | Disposition: A | Discharge status: Home / Self Care | Payer: Medicaid Other | Source: Ambulatory Visit | Attending: Certified Nurse Midwife | Admitting: Certified Nurse Midwife

## 2015-11-28 VITALS — BP 111/69 | HR 103 | Temp 98.5°F | Wt 131.8 lb

## 2015-11-28 DIAGNOSIS — O09212 Supervision of pregnancy with history of pre-term labor, second trimester: Secondary | ICD-10-CM

## 2015-11-28 DIAGNOSIS — O0992 Supervision of high risk pregnancy, unspecified, second trimester: Secondary | ICD-10-CM

## 2015-11-28 DIAGNOSIS — Z3A21 21 weeks gestation of pregnancy: Secondary | ICD-10-CM

## 2015-11-28 DIAGNOSIS — O09892 Supervision of other high risk pregnancies, second trimester: Secondary | ICD-10-CM

## 2015-11-28 DIAGNOSIS — Z3689 Encounter for other specified antenatal screening: Secondary | ICD-10-CM

## 2015-11-28 DIAGNOSIS — Z36 Encounter for antenatal screening of mother: Secondary | ICD-10-CM | POA: Diagnosis not present

## 2015-11-28 LAB — POCT URINALYSIS DIPSTICK
Bilirubin, UA: NEGATIVE
Blood, UA: NEGATIVE
GLUCOSE UA: NEGATIVE
Ketones, UA: NEGATIVE
Leukocytes, UA: NEGATIVE
NITRITE UA: NEGATIVE
Protein, UA: NEGATIVE
Spec Grav, UA: 1.01
UROBILINOGEN UA: NEGATIVE
pH, UA: 6

## 2015-11-28 MED ORDER — PROGESTERONE MICRONIZED 200 MG PO CAPS: ORAL_CAPSULE | ORAL | Status: DC

## 2015-11-28 NOTE — Progress Notes (Signed)
Subjective:    Sandra Matthews is a 20 y.o. female being seen today for her obstetrical visit. She is at [redacted]w[redacted]d gestation. Patient reports: no bleeding, no contractions, no cramping and no leaking . Fetal movement: normal.  Discussed ultrasound results, patient encouraged nothing vaginally besides medications, patient verbalized understanding.  Discussed vaginal progesterone.    Problem List Items Addressed This Visit    None    Visit Diagnoses    Supervision of high risk pregnancy, antepartum, second trimester    -  Primary    Relevant Orders    POCT urinalysis dipstick (Completed)      Patient Active Problem List   Diagnosis Date Noted  . History of preterm delivery, currently pregnant in second trimester 11/18/2015  . Late prenatal care 11/18/2015   Objective:    BP 111/69 mmHg  Pulse 103  Temp(Src) 98.5 F (36.9 C)  Wt 131 lb 12.8 oz (59.784 kg)  LMP 07/01/2015 FHT: 150 BPM  Uterine Size: size equals dates     Assessment:    Pregnancy @ [redacted]w[redacted]d    H/O preterm labor/delivery  Late prenatal care Plan:    OBGCT: discussed. Signs and symptoms of preterm labor: discussed.  Labs, problem list reviewed and updated 2 hr GTT planned Follow up in 2 weeks.

## 2015-11-28 NOTE — Progress Notes (Signed)
MATERNAL FETAL MEDICINE CONSULT  Patient Name: Sandra Matthews Medical Record Number:  [id number] Date of Birth: [birth year] Requesting Physician Name:  Roe Coombs, CNM Date of Service: 11/28/2015  Chief Complaint History of preterm delivery at 30 weeks  History of Present Illness Sandra Matthews was seen today secondary to history of preterm delivery at the request of Roe Coombs, CNM.  The patient is a 20 y.o. [id number],AO [redacted]w[redacted]d with an EDD of 04/06/2016, by Last Menstrual Period dating method.  Sandra Matthews had a spontaneous preterm delivery at 30 weeks in her last pregnancy.  She has had not other problems this pregnancy, and has no acute complaints.  Review of Systems Pertinent items are noted in HPI.  Patient History OB History  Gravida Para Term Preterm AB SAB TAB Ectopic Multiple Living     # Outcome Date GA Lbr Len/2nd Weight Sex Delivery Anes PTL Lv  2 Current           1 Preterm 03/09/11 [redacted]w[redacted]d  4 lb 2 oz (1.871 kg) M Vag-Spont None Y Y      Past Medical History  Diagnosis Date  . No pertinent past medical history   . Medical history non-contributory     Past Surgical History  Procedure Laterality Date  . Fracture surgery      Social History   Social History  . Marital Status: Single    Spouse Name: N/A  . Number of Children: N/A  . Years of Education: N/A   Social History Main Topics  . Smoking status: Never Smoker   . Smokeless tobacco: Never Used  . Alcohol Use: No  . Drug Use: 2.00 per week    Special: Marijuana     Comment: Occassionally   . Sexual Activity: Yes    Birth Control/ Protection: None   Other Topics Concern  . Not on file   Social History Narrative    Family History  Problem Relation Age of Onset  . Bipolar disorder Father   . Depression Father   . Anxiety disorder Father    In addition, the patient has no family history of mental retardation, birth defects, or genetic  diseases.  Physical Examination There were no vitals filed for this visit. General appearance - alert, well appearing, and in no distress  Transvaginal ultrasound:  Cervical length 3.5 cm at rest, 2.5 cm with fundal pressure  Assessment and Recommendations 1.  Prior preterm birth.  Although her cervical length is technically normal at 2.5 cm, the dynamic change observed and her relatively early (30 week) delivery in her last pregnancy does raise concern for a recurrent preterm birth.  Given this risk I recommend  Sandra Matthews start daily vaginal progesterone supplementation with micronized progesterone (Prometrium) 200 mg per vagina qhs.  In addition, she should have serial transvaginal cervical length measurements starting weekly.  If her cervical length should fall below 2.5 cm prior to 23 weeks of gestation cervical cerclage placement should be considered.  Should cervical shortening be found after viability, hospitalization could be considered especially if very remote from term and the patient lives far from a tertiary care center to ensure delivery of a severely preterm infant occurs at an appropriately equipped facility.  However, strict bedrest has not been proven to reduce risk of preterm delivery or prolong gestations and thus should be avoided.  However, activity restriction would be reasonable again especially if cervical shortening is  seen very remote from term.  I spent 30 minutes with Sandra Matthews today of which 50% was face-to-face counseling.  Thank you for referring Sandra Matthews to the [facility].  Please do not hesitate to contact us with questions.   Rema Fendt, MD

## 2015-11-28 NOTE — Progress Notes (Signed)
Pt has questions/concerns about u/s from MFM today.  ? Cervical length, what does that mean.

## 2015-11-29 ENCOUNTER — Ambulatory Visit (INDEPENDENT_AMBULATORY_CARE_PROVIDER_SITE_OTHER): Payer: Medicaid Other | Admitting: *Deleted

## 2015-11-29 VITALS — BP 114/69 | HR 97 | Wt 133.0 lb

## 2015-11-29 DIAGNOSIS — O09212 Supervision of pregnancy with history of pre-term labor, second trimester: Secondary | ICD-10-CM | POA: Diagnosis not present

## 2015-11-29 DIAGNOSIS — O0992 Supervision of high risk pregnancy, unspecified, second trimester: Secondary | ICD-10-CM

## 2015-11-29 MED ORDER — HYDROXYPROGESTERONE CAPROATE 250 MG/ML IM OIL
250.0000 mg | TOPICAL_OIL | INTRAMUSCULAR | Status: AC
  Administered 2015-11-29 – 2016-02-08 (×5): 250 mg via INTRAMUSCULAR

## 2015-11-29 NOTE — Progress Notes (Signed)
Pt is in office today for start of 17p injections.  Injection policy was explained to pt.  Pt was advised that she may schedule weekly appt in advance if she would like.  Pt made aware that she will be seen weekly for injections.  Pt states understanding. Injection was given, pt tolerated well.  Administrations This Visit    hydroxyprogesterone caproate (MAKENA) 250 mg/mL injection 250 mg    Admin Date Action Dose Route Administered By         11/29/2015 Given 250 mg Intramuscular Lanney Gins, CMA

## 2015-11-30 ENCOUNTER — Encounter: Payer: Medicaid Other | Admitting: Certified Nurse Midwife

## 2015-12-05 ENCOUNTER — Ambulatory Visit (HOSPITAL_COMMUNITY)
Admission: RE | Admit: 2015-12-05 | Discharge: 2015-12-05 | Disposition: A | Discharge status: Home / Self Care | Payer: Medicaid Other | Source: Ambulatory Visit | Attending: Certified Nurse Midwife | Admitting: Certified Nurse Midwife

## 2015-12-05 ENCOUNTER — Encounter (HOSPITAL_COMMUNITY): Payer: Self-pay

## 2015-12-05 DIAGNOSIS — O09212 Supervision of pregnancy with history of pre-term labor, second trimester: Secondary | ICD-10-CM | POA: Diagnosis present

## 2015-12-05 DIAGNOSIS — Z3A22 22 weeks gestation of pregnancy: Secondary | ICD-10-CM | POA: Diagnosis not present

## 2015-12-05 DIAGNOSIS — O09892 Supervision of other high risk pregnancies, second trimester: Secondary | ICD-10-CM

## 2015-12-06 ENCOUNTER — Ambulatory Visit (INDEPENDENT_AMBULATORY_CARE_PROVIDER_SITE_OTHER): Payer: Medicaid Other | Admitting: Obstetrics

## 2015-12-06 ENCOUNTER — Ambulatory Visit: Payer: Medicaid Other

## 2015-12-06 VITALS — BP 113/71 | HR 111 | Wt 134.0 lb

## 2015-12-06 DIAGNOSIS — O3432 Maternal care for cervical incompetence, second trimester: Secondary | ICD-10-CM

## 2015-12-06 DIAGNOSIS — Z3482 Encounter for supervision of other normal pregnancy, second trimester: Secondary | ICD-10-CM

## 2015-12-06 DIAGNOSIS — O0992 Supervision of high risk pregnancy, unspecified, second trimester: Secondary | ICD-10-CM

## 2015-12-06 NOTE — Progress Notes (Signed)
Pt advised to discuss last MFM/ultrasound with Dr Clearance Coots.

## 2015-12-06 NOTE — Progress Notes (Signed)
Subjective:    Sandra Matthews is a 20 y.o. female being seen today for her obstetrical visit. She is at [redacted]w[redacted]d gestation. Patient reports: no complaints . Fetal movement: normal.  Problem List Items Addressed This Visit    None    Visit Diagnoses    Incompetent cervix in pregnancy, antepartum, second trimester    -  Primary    Supervision of high risk pregnancy in second trimester          Patient Active Problem List   Diagnosis Date Noted  . History of preterm delivery, currently pregnant in second trimester 11/18/2015  . Late prenatal care 11/18/2015   Objective:    BP 113/71 mmHg  Pulse 111  Wt 134 lb (60.782 kg)  LMP 07/01/2015 FHT: 150 BPM  Uterine Size: size equals dates     Assessment:    Pregnancy @ [redacted]w[redacted]d     Preterm cervical shortening on serial ultrasounds over past few weeks, from ~2.8cm to ~2.1cm.  Discussed management with MFM  Plan:   Cerclage recommended by MFM.  Discussed with patient, and she agrees.  All questions answered.   Signs and symptoms of preterm labor: discussed.  Labs, problem list reviewed and updated 2 hr GTT planned Follow up in 2 weeks.

## 2015-12-06 NOTE — Progress Notes (Signed)
Pt in office for 17p injection. Pt toletated injection well.  Pt also was seen by Dr Clearance Coots today to discuss MFM results.  BP 113/71 mmHg  Pulse 111  Wt 134 lb (60.782 kg)  LMP 07/01/2015  Administrations This Visit    hydroxyprogesterone caproate (MAKENA) 250 mg/mL injection 250 mg    Admin Date Action Dose Route Administered By         12/06/2015 Given 250 mg Intramuscular Lanney Gins, CMA

## 2015-12-07 ENCOUNTER — Encounter: Payer: Self-pay | Admitting: Obstetrics

## 2015-12-07 ENCOUNTER — Other Ambulatory Visit: Payer: Self-pay | Admitting: Obstetrics

## 2015-12-11 ENCOUNTER — Encounter (HOSPITAL_COMMUNITY): Payer: Self-pay | Admitting: Anesthesiology

## 2015-12-11 ENCOUNTER — Ambulatory Visit (HOSPITAL_COMMUNITY): Admission: RE | Admit: 2015-12-11 | Payer: Medicaid Other | Source: Ambulatory Visit | Admitting: Obstetrics

## 2015-12-11 SURGERY — CERCLAGE, CERVIX, VAGINAL APPROACH
Anesthesia: Choice

## 2015-12-12 ENCOUNTER — Encounter: Payer: Medicaid Other | Admitting: Certified Nurse Midwife

## 2015-12-19 ENCOUNTER — Ambulatory Visit (HOSPITAL_COMMUNITY): Admission: RE | Admit: 2015-12-19 | Payer: Medicaid Other | Source: Ambulatory Visit

## 2015-12-19 ENCOUNTER — Encounter: Payer: Medicaid Other | Admitting: Certified Nurse Midwife

## 2015-12-20 ENCOUNTER — Encounter: Payer: Medicaid Other | Admitting: Certified Nurse Midwife

## 2015-12-27 ENCOUNTER — Ambulatory Visit (INDEPENDENT_AMBULATORY_CARE_PROVIDER_SITE_OTHER): Payer: Medicaid Other | Admitting: Certified Nurse Midwife

## 2015-12-27 VITALS — BP 114/74 | HR 104 | Temp 98.5°F | Wt 139.0 lb

## 2015-12-27 DIAGNOSIS — O3432 Maternal care for cervical incompetence, second trimester: Secondary | ICD-10-CM

## 2015-12-27 DIAGNOSIS — O0992 Supervision of high risk pregnancy, unspecified, second trimester: Secondary | ICD-10-CM

## 2015-12-27 LAB — POCT URINALYSIS DIPSTICK
Bilirubin, UA: NEGATIVE
Blood, UA: NEGATIVE
Glucose, UA: NEGATIVE
Ketones, UA: NEGATIVE
LEUKOCYTES UA: NEGATIVE
NITRITE UA: NEGATIVE
PH UA: 6
Spec Grav, UA: 1.015
UROBILINOGEN UA: NEGATIVE

## 2015-12-27 LAB — OB RESULTS CONSOLE GC/CHLAMYDIA
Chlamydia: NEGATIVE
Gonorrhea: NEGATIVE

## 2015-12-28 ENCOUNTER — Ambulatory Visit (HOSPITAL_COMMUNITY): Payer: Medicaid Other

## 2015-12-28 LAB — GC/CHLAMYDIA PROBE AMP
CT PROBE, AMP APTIMA: NOT DETECTED
GC Probe RNA: NOT DETECTED

## 2015-12-28 NOTE — Progress Notes (Signed)
Subjective:    Sandra Matthews is a 20 y.o. female being seen today for her obstetrical visit. She is at 4259w5d gestation. Patient reports: no complaints . Fetal movement: normal.  Problem List Items Addressed This Visit    None    Visit Diagnoses    Supervision of high risk pregnancy in second trimester    -  Primary    Relevant Orders    POCT urinalysis dipstick (Completed)    GC/Chlamydia Probe Amp (Completed)      Patient Active Problem List   Diagnosis Date Noted  . History of preterm delivery, currently pregnant in second trimester 11/18/2015  . Late prenatal care 11/18/2015   Objective:    BP 114/74 mmHg  Pulse 104  Temp(Src) 98.5 F (36.9 C)  Wt 139 lb (63.05 kg)  LMP 07/01/2015 FHT: 155 BPM  Uterine Size: size equals dates     Assessment:    Pregnancy @ 3559w5d    Supervision of high risk pregnancy: on 17-P injections  Plan:    OBGCT: discussed and ordered for next visit. Signs and symptoms of preterm labor: discussed.  Labs, problem list reviewed and updated 2 hr GTT planned Follow up in 3 weeks.

## 2015-12-29 ENCOUNTER — Ambulatory Visit (HOSPITAL_COMMUNITY): Payer: Medicaid Other | Attending: Certified Nurse Midwife

## 2016-01-02 ENCOUNTER — Inpatient Hospital Stay (HOSPITAL_COMMUNITY)
Admission: AD | Admit: 2016-01-02 | Discharge: 2016-01-02 | Disposition: A | Discharge status: Home / Self Care | Payer: Medicaid Other | Source: Ambulatory Visit | Attending: Obstetrics | Admitting: Obstetrics

## 2016-01-02 ENCOUNTER — Encounter (HOSPITAL_COMMUNITY): Payer: Self-pay | Admitting: *Deleted

## 2016-01-02 DIAGNOSIS — O99612 Diseases of the digestive system complicating pregnancy, second trimester: Secondary | ICD-10-CM | POA: Insufficient documentation

## 2016-01-02 DIAGNOSIS — O9989 Other specified diseases and conditions complicating pregnancy, childbirth and the puerperium: Secondary | ICD-10-CM | POA: Diagnosis not present

## 2016-01-02 DIAGNOSIS — Z3A26 26 weeks gestation of pregnancy: Secondary | ICD-10-CM | POA: Diagnosis not present

## 2016-01-02 DIAGNOSIS — O212 Late vomiting of pregnancy: Secondary | ICD-10-CM | POA: Diagnosis present

## 2016-01-02 DIAGNOSIS — A084 Viral intestinal infection, unspecified: Secondary | ICD-10-CM | POA: Insufficient documentation

## 2016-01-02 LAB — URINE MICROSCOPIC-ADD ON: RBC / HPF: NONE SEEN RBC/hpf (ref 0–5)

## 2016-01-02 LAB — URINALYSIS, ROUTINE W REFLEX MICROSCOPIC
BILIRUBIN URINE: NEGATIVE
GLUCOSE, UA: NEGATIVE mg/dL
HGB URINE DIPSTICK: NEGATIVE
Leukocytes, UA: NEGATIVE
NITRITE: NEGATIVE
PH: 6 (ref 5.0–8.0)
Protein, ur: 30 mg/dL — AB

## 2016-01-02 MED ORDER — SODIUM CHLORIDE 0.9 % IV BOLUS (SEPSIS)
2000.0000 mL | Freq: Once | INTRAVENOUS | Status: AC
  Administered 2016-01-02: 1000 mL via INTRAVENOUS

## 2016-01-02 MED ORDER — PROMETHAZINE HCL 25 MG PO TABS: 25.0000 mg | ORAL_TABLET | Freq: Four times a day (QID) | ORAL | Status: DC | PRN

## 2016-01-02 MED ORDER — PROMETHAZINE HCL 25 MG/ML IJ SOLN
12.5000 mg | Freq: Four times a day (QID) | INTRAMUSCULAR | Status: DC | PRN
  Administered 2016-01-02: 25 mg via INTRAVENOUS
  Filled 2016-01-02: qty 1

## 2016-01-02 NOTE — Discharge Instructions (Signed)

## 2016-01-02 NOTE — MAU Provider Note (Signed)
History    CSN: [id number] Arrival date and time: 01/02/16 1002 First Provider Initiated Contact with Patient 01/02/16 1050     Chief Complaint  Patient presents with  . Emesis  . Diarrhea   HPI Patient is 20 y.o. [id number] [redacted]w[redacted]d here with complaints of emesis and diarrhea..Pregnancy complicated by h/o PTD and currently on 17 P.  Reports diarrhea starting yesterday, approximately 6 times in the last 24 hours. Reports Vomiting starting at 2 am this AM, 10 times since that time. Reports minimal urine output. Denies fevers. Reports mild chills since yesterday. Reports abdominal cramping, no contractions. Denies known sick contracts. Denies cough. Denies dysuria, polyuria, flank pain.  +FM, denies LOF, VB, contractions, vaginal discharge.   OB History    Gravida Para Term Preterm AB TAB SAB Ectopic Multiple Living        Past Medical History  Diagnosis Date  . No pertinent past medical history   . Preterm labor     Past Surgical History  Procedure Laterality Date  . Fracture surgery      Family History  Problem Relation Age of Onset  . Bipolar disorder Father   . Depression Father   . Anxiety disorder Father     Social History  Substance Use Topics  . Smoking status: Never Smoker   . Smokeless tobacco: Never Used  . Alcohol Use: No    Allergies: No Known Allergies  Facility-administered medications prior to admission  Medication Dose Route Frequency Provider Last Rate Last Dose  . hydroxyprogesterone caproate (MAKENA) 250 mg/mL injection 250 mg  250 mg Intramuscular Weekly Rachelle A Denney, CNM   250 mg at 12/27/15 1730   Prescriptions prior to admission  Medication Sig Dispense Refill Last Dose  . ondansetron (ZOFRAN) 4 MG tablet Take 1 tablet (4 mg total) by mouth daily as needed. (Patient taking differently: Take 4 mg by mouth daily as needed for nausea or vomiting. ) 30 tablet 1 01/01/2016 at Unknown time  . Prenatal Vit-Fe Phos-FA-Omega  (VITAFOL GUMMIES) 3.33-0.333-34.8 MG CHEW Chew 3 tablets by mouth daily. 90 tablet 12 01/01/2016 at Unknown time  . butalbital-acetaminophen-caffeine (FIORICET) 50-325-40 MG tablet Take 1-2 tablets by mouth every 6 (six) hours as needed for headache. 45 tablet 4 prn  . diphenhydrAMINE (BENADRYL) 25 MG tablet Take 2 tablets (50 mg total) by mouth at bedtime. (Patient not taking: Reported on 01/02/2016) 90 tablet 4 Not Taking at Unknown time  . glycopyrrolate (ROBINUL) 1 MG tablet Take 1 tablet (1 mg total) by mouth 3 (three) times daily. (Patient not taking: Reported on 11/16/2015) 90 tablet 1 Not Taking  . progesterone (PROMETRIUM) 200 MG capsule Place 1 tablet vaginally daily. (Patient not taking: Reported on 12/27/2015) 30 capsule 9 Completed Course at Unknown time    Review of Systems  Constitutional: Negative for fever and chills.  Eyes: Negative for blurred vision and double vision.  Respiratory: Negative for cough and shortness of breath.   Cardiovascular: Negative for chest pain and orthopnea.  Gastrointestinal: Negative for nausea and vomiting.  Genitourinary: Negative for dysuria, frequency and flank pain.  Musculoskeletal: Negative for myalgias.  Skin: Negative for rash.  Neurological: Negative for dizziness, tingling, weakness and headaches.  Endo/Heme/Allergies: Does not bruise/bleed easily.  Psychiatric/Behavioral: Negative for depression and suicidal ideas. The patient is not nervous/anxious.    Physical Exam   Blood pressure 113/62, pulse 86, temperature 98.1 F (36.7 C), temperature source Oral, resp. rate  18, height 5\' 2"  (1.575 m), weight 135 lb 12.8 oz (61.598 kg), last menstrual period 07/01/2015, SpO2 100 %, unknown if currently breastfeeding.  Physical Exam  Nursing note and vitals reviewed. Constitutional: She is oriented to person, place, and time. She appears well-developed and well-nourished. No distress.  Pregnant female  HENT:  Head: Normocephalic and atraumatic.   Eyes: Conjunctivae are normal. No scleral icterus.  Neck: Normal range of motion. Neck supple.  Cardiovascular: Normal rate and intact distal pulses.   Respiratory: Effort normal. She exhibits no tenderness.  GI: Soft. There is no tenderness. There is no rebound and no guarding.  Gravid  Genitourinary: Vagina normal.  Musculoskeletal: Normal range of motion. She exhibits no edema.  Neurological: She is alert and oriented to person, place, and time.  Skin: Skin is warm and dry. No rash noted.  Slow capillary refill  Psychiatric: She has a normal mood and affect.    MAU Course  Procedures  MDM- dehydration evident on UA and exam.   NST 135/mod/+accels, no decels Toco quiet  Results for orders placed or performed during the hospital encounter of 01/02/16 (from the past 48 hour(s))  Urinalysis, Routine w reflex microscopic ([facility])     Status: Abnormal   Collection Time: 01/02/16 10:15 AM  Result Value Ref Range   Color, Urine YELLOW YELLOW   APPearance CLEAR CLEAR   Specific Gravity, Urine >1.030 (H) 1.005 - 1.030   pH 6.0 5.0 - 8.0   Glucose, UA NEGATIVE NEGATIVE mg/dL   Hgb urine dipstick NEGATIVE NEGATIVE   Bilirubin Urine NEGATIVE NEGATIVE   Ketones, ur >80 (A) NEGATIVE mg/dL   Protein, ur 30 (A) NEGATIVE mg/dL   Nitrite NEGATIVE NEGATIVE   Leukocytes, UA NEGATIVE NEGATIVE  Urine microscopic-add on     Status: Abnormal   Collection Time: 01/02/16 10:15 AM  Result Value Ref Range   Squamous Epithelial / LPF 0-5 (A) NONE SEEN   WBC, UA 0-5 0 - 5 WBC/hpf   RBC / HPF NONE SEEN 0 - 5 RBC/hpf   Bacteria, UA RARE (A) NONE SEEN     Assessment and Plan  Sandra Matthews 19 y.o. is a 20 y.o. G2P0101 at 2865w3d presenting with likely viral gastroenteritis  Viral gastro - 2 L IVF, phenergan - reviewed hand washing - discussed return to precautions and importance of hydration - home with rx for phenergan  Federico FlakeKimberly Niles Helix Lafontaine 01/02/2016, 10:50 AM

## 2016-01-02 NOTE — MAU Note (Signed)
Pt C/O vomiting since 0200, diarrhea for the past 2 days, has pain & "funny feeling" in abdomen.  Has chest pain during & after vomiting.  Denies bleeding or LOF.  4-5 diarrhea stools in last 24 hours.

## 2016-01-03 ENCOUNTER — Encounter (HOSPITAL_COMMUNITY): Payer: Self-pay | Admitting: *Deleted

## 2016-01-03 ENCOUNTER — Inpatient Hospital Stay (HOSPITAL_COMMUNITY)
Admission: AD | Admit: 2016-01-03 | Discharge: 2016-01-03 | Disposition: A | Discharge status: Home / Self Care | Payer: Medicaid Other | Source: Ambulatory Visit | Attending: Obstetrics | Admitting: Obstetrics

## 2016-01-03 DIAGNOSIS — K529 Noninfective gastroenteritis and colitis, unspecified: Secondary | ICD-10-CM | POA: Insufficient documentation

## 2016-01-03 DIAGNOSIS — O99612 Diseases of the digestive system complicating pregnancy, second trimester: Secondary | ICD-10-CM | POA: Diagnosis not present

## 2016-01-03 DIAGNOSIS — O212 Late vomiting of pregnancy: Secondary | ICD-10-CM | POA: Diagnosis present

## 2016-01-03 DIAGNOSIS — E860 Dehydration: Secondary | ICD-10-CM | POA: Diagnosis not present

## 2016-01-03 DIAGNOSIS — Z3A26 26 weeks gestation of pregnancy: Secondary | ICD-10-CM | POA: Diagnosis not present

## 2016-01-03 LAB — URINALYSIS, ROUTINE W REFLEX MICROSCOPIC
Glucose, UA: NEGATIVE mg/dL
Hgb urine dipstick: NEGATIVE
Ketones, ur: >80 mg/dL — AB
LEUKOCYTES UA: NEGATIVE
NITRITE: NEGATIVE
PH: 6 (ref 5.0–8.0)
Protein, ur: 30 mg/dL — AB

## 2016-01-03 LAB — URINE MICROSCOPIC-ADD ON: RBC / HPF: NONE SEEN RBC/hpf (ref 0–5)

## 2016-01-03 MED ORDER — ONDANSETRON HCL 4 MG/2ML IJ SOLN
4.0000 mg | Freq: Once | INTRAMUSCULAR | Status: AC
  Administered 2016-01-03: 4 mg via INTRAVENOUS
  Filled 2016-01-03: qty 2

## 2016-01-03 MED ORDER — LACTATED RINGERS IV SOLN
INTRAVENOUS | Status: DC
  Administered 2016-01-03: 07:00:00 via INTRAVENOUS

## 2016-01-03 MED ORDER — DEXTROSE 5 % IN LACTATED RINGERS IV BOLUS
1000.0000 mL | Freq: Once | INTRAVENOUS | Status: AC
  Administered 2016-01-03: 1000 mL via INTRAVENOUS

## 2016-01-03 MED ORDER — ONDANSETRON 8 MG PO TBDP: 8.0000 mg | ORAL_TABLET | Freq: Three times a day (TID) | ORAL | Status: DC | PRN

## 2016-01-03 NOTE — Discharge Instructions (Signed)
Food Choices to Help Relieve Diarrhea, Adult °When you have diarrhea, the foods you eat and your eating habits are very important. Choosing the right foods and drinks can help relieve diarrhea. Also, because diarrhea can last up to 7 days, you need to replace lost fluids and electrolytes (such as sodium, potassium, and chloride) in order to help prevent dehydration.  °WHAT GENERAL GUIDELINES DO I NEED TO FOLLOW? °· Slowly drink 1 cup (8 oz) of fluid for each episode of diarrhea. If you are getting enough fluid, your urine will be clear or pale yellow. °· Eat starchy foods. Some good choices include white rice, white toast, pasta, low-fiber cereal, baked potatoes (without the skin), saltine crackers, and bagels. °· Avoid large servings of any cooked vegetables. °· Limit fruit to two servings per day. A serving is ½ cup or 1 small piece. °· Choose foods with less than 2 g of fiber per serving. °· Limit fats to less than 8 tsp (38 g) per day. °· Avoid fried foods. °· Eat foods that have probiotics in them. Probiotics can be found in certain dairy products. °· Avoid foods and beverages that may increase the speed at which food moves through the stomach and intestines (gastrointestinal tract). Things to avoid include: °· High-fiber foods, such as dried fruit, raw fruits and vegetables, nuts, seeds, and whole grain foods. °· Spicy foods and high-fat foods. °· Foods and beverages sweetened with high-fructose corn syrup, honey, or sugar alcohols such as xylitol, sorbitol, and mannitol. °WHAT FOODS ARE RECOMMENDED? °Grains °White rice. White, French, or pita breads (fresh or toasted), including plain rolls, buns, or bagels. White pasta. Saltine, soda, or graham crackers. Pretzels. Low-fiber cereal. Cooked cereals made with water (such as cornmeal, farina, or cream cereals). Plain muffins. Matzo. Melba toast. Zwieback.  °Vegetables °Potatoes (without the skin). Strained tomato and vegetable juices. Most well-cooked and canned  vegetables without seeds. Tender lettuce. °Fruits °Cooked or canned applesauce, apricots, cherries, fruit cocktail, grapefruit, peaches, pears, or plums. Fresh bananas, apples without skin, cherries, grapes, cantaloupe, grapefruit, peaches, oranges, or plums.  °Meat and Other Protein Products °Baked or boiled chicken. Eggs. Tofu. Fish. Seafood. Smooth peanut butter. Ground or well-cooked tender beef, ham, veal, lamb, pork, or poultry.  °Dairy °Plain yogurt, kefir, and unsweetened liquid yogurt. Lactose-free milk, buttermilk, or soy milk. Plain hard cheese. °Beverages °Sport drinks. Clear broths. Diluted fruit juices (except prune). Regular, caffeine-free sodas such as ginger ale. Water. Decaffeinated teas. Oral rehydration solutions. Sugar-free beverages not sweetened with sugar alcohols. °Other °Bouillon, broth, or soups made from recommended foods.  °The items listed above may not be a complete list of recommended foods or beverages. Contact your dietitian for more options. °WHAT FOODS ARE NOT RECOMMENDED? °Grains °Whole grain, whole wheat, bran, or rye breads, rolls, pastas, crackers, and cereals. Wild or brown rice. Cereals that contain more than 2 g of fiber per serving. Corn tortillas or taco shells. Cooked or dry oatmeal. Granola. Popcorn. °Vegetables °Raw vegetables. Cabbage, broccoli, Brussels sprouts, artichokes, baked beans, beet greens, corn, kale, legumes, peas, sweet potatoes, and yams. Potato skins. Cooked spinach and cabbage. °Fruits °Dried fruit, including raisins and dates. Raw fruits. Stewed or dried prunes. Fresh apples with skin, apricots, mangoes, pears, raspberries, and strawberries.  °Meat and Other Protein Products °Chunky peanut butter. Nuts and seeds. Beans and lentils. Bacon.  °Dairy °High-fat cheeses. Milk, chocolate milk, and beverages made with milk, such as milk shakes. Cream. Ice cream. °Sweets and Desserts °Sweet rolls, doughnuts, and sweet breads.   Pancakes and waffles. °Fats and  Oils °Butter. Cream sauces. Margarine. Salad oils. Plain salad dressings. Olives. Avocados.  °Beverages °Caffeinated beverages (such as coffee, tea, soda, or energy drinks). Alcoholic beverages. Fruit juices with pulp. Prune juice. Soft drinks sweetened with high-fructose corn syrup or sugar alcohols. °Other °Coconut. Hot sauce. Chili powder. Mayonnaise. Gravy. Cream-based or milk-based soups.  °The items listed above may not be a complete list of foods and beverages to avoid. Contact your dietitian for more information. °WHAT SHOULD I DO IF I BECOME DEHYDRATED? °Diarrhea can sometimes lead to dehydration. Signs of dehydration include dark urine and dry mouth and skin. If you think you are dehydrated, you should rehydrate with an oral rehydration solution. These solutions can be purchased at pharmacies, retail stores, or online.  °Drink ½-1 cup (120-240 mL) of oral rehydration solution each time you have an episode of diarrhea. If drinking this amount makes your diarrhea worse, try drinking smaller amounts more often. For example, drink 1-3 tsp (5-15 mL) every 5-10 minutes.  °A general rule for staying hydrated is to drink 1½-2 L of fluid per day. Talk to your health care provider about the specific amount you should be drinking each day. Drink enough fluids to keep your urine clear or pale yellow. °  °This information is not intended to replace advice given to you by your health care provider. Make sure you discuss any questions you have with your health care provider. °  °Document Released: 12/28/2003 Document Revised: 10/28/2014 Document Reviewed: 08/30/2013 °Elsevier Interactive Patient Education ©2016 [employer] ° °Viral Gastroenteritis °Viral gastroenteritis is also known as stomach flu. This condition affects the stomach and intestinal tract. It can cause sudden diarrhea and vomiting. The illness typically lasts 3 to 8 days. Most people develop an immune response that eventually gets rid of the virus.  While this natural response develops, the virus can make you quite ill. °CAUSES  °Many different viruses can cause gastroenteritis, such as rotavirus or noroviruses. You can catch one of these viruses by consuming contaminated food or water. You may also catch a virus by sharing utensils or other personal items with an infected person or by touching a contaminated surface. °SYMPTOMS  °The most common symptoms are diarrhea and vomiting. These problems can cause a severe loss of body fluids (dehydration) and a body salt (electrolyte) imbalance. Other symptoms may include: °· Fever. °· Headache. °· Fatigue. °· Abdominal pain. °DIAGNOSIS  °Your caregiver can usually diagnose viral gastroenteritis based on your symptoms and a physical exam. A stool sample may also be taken to test for the presence of viruses or other infections. °TREATMENT  °This illness typically goes away on its own. Treatments are aimed at rehydration. The most serious cases of viral gastroenteritis involve vomiting so severely that you are not able to keep fluids down. In these cases, fluids must be given through an intravenous line (IV). °HOME CARE INSTRUCTIONS  °· Drink enough fluids to keep your urine clear or pale yellow. Drink small amounts of fluids frequently and increase the amounts as tolerated. °· Ask your caregiver for specific rehydration instructions. °· Avoid: °¨ Foods high in sugar. °¨ Alcohol. °¨ Carbonated drinks. °¨ Tobacco. °¨ Juice. °¨ Caffeine drinks. °¨ Extremely hot or cold fluids. °¨ Fatty, greasy foods. °¨ Too much intake of anything at one time. °¨ Dairy products until 24 to 48 hours after diarrhea stops. °· You may consume probiotics. Probiotics are active cultures of beneficial bacteria. They may lessen the amount and   number of diarrheal stools in adults. Probiotics can be found in yogurt with active cultures and in supplements. °· Wash your hands well to avoid spreading the virus. °· Only take over-the-counter or  prescription medicines for pain, discomfort, or fever as directed by your caregiver. Do not give aspirin to children. Antidiarrheal medicines are not recommended. °· Ask your caregiver if you should continue to take your regular prescribed and over-the-counter medicines. °· Keep all follow-up appointments as directed by your caregiver. °SEEK IMMEDIATE MEDICAL CARE IF:  °· You are unable to keep fluids down. °· You do not urinate at least once every 6 to 8 hours. °· You develop shortness of breath. °· You notice blood in your stool or vomit. This may look like coffee grounds. °· You have abdominal pain that increases or is concentrated in one small area (localized). °· You have persistent vomiting or diarrhea. °· You have a fever. °· The patient is a child younger than 3 months, and he or she has a fever. °· The patient is a child older than 3 months, and he or she has a fever and persistent symptoms. °· The patient is a child older than 3 months, and he or she has a fever and symptoms suddenly get worse. °· The patient is a baby, and he or she has no tears when crying. °MAKE SURE YOU:  °· Understand these instructions. °· Will watch your condition. °· Will get help right away if you are not doing well or get worse. °  °This information is not intended to replace advice given to you by your health care provider. Make sure you discuss any questions you have with your health care provider. °  °Document Released: 10/07/2005 Document Revised: 12/30/2011 Document Reviewed: 07/24/2011 °Elsevier Interactive Patient Education ©2016 Elsevier Inc. ° °

## 2016-01-03 NOTE — MAU Provider Note (Signed)
History     CSN: [id number]  Arrival date and time: 01/03/16 [id number]   First Provider Initiated Contact with Patient 01/03/16 0750      Chief Complaint  Patient presents with  . Emesis   HPI  Ms. Sandra Matthews is a 20 y.o. G2P0101 at 265w4d who presents to MAU today with complaint of N/V/D since yesterday. The patient was seen in MAU yesterday when the symptoms started. She was given IV fluids and Phenergan yesterday. She feels the Phenergan helps somewhat, but she has still had ~ 6 episodes of emesis and 3 episodes of diarrhea today. She denies fever, abdominal pain, bleeding, LOF, contractions or sick contacts.   OB History    Gravida Para Term Preterm AB TAB SAB Ectopic Multiple Living   2 1 0 1 0 0 0 0 0 1       Past Medical History  Diagnosis Date  . No pertinent past medical history   . Preterm labor     Past Surgical History  Procedure Laterality Date  . Fracture surgery      Family History  Problem Relation Age of Onset  . Bipolar disorder Father   . Depression Father   . Anxiety disorder Father     Social History  Substance Use Topics  . Smoking status: Never Smoker   . Smokeless tobacco: Never Used  . Alcohol Use: No    Allergies: No Known Allergies  Facility-administered medications prior to admission  Medication Dose Route Frequency Provider Last Rate Last Dose  . hydroxyprogesterone caproate (MAKENA) 250 mg/mL injection 250 mg  250 mg Intramuscular Weekly Rachelle A Denney, CNM   250 mg at 12/27/15 1730   Prescriptions prior to admission  Medication Sig Dispense Refill Last Dose  . progesterone (PROMETRIUM) 200 MG capsule Place 1 tablet vaginally daily. 30 capsule 9 Past Week at Unknown time  . promethazine (PHENERGAN) 25 MG tablet Take 1 tablet (25 mg total) by mouth every 6 (six) hours as needed for nausea or vomiting. 30 tablet 2 01/02/2016 at 2130  . butalbital-acetaminophen-caffeine (FIORICET) 50-325-40 MG tablet Take 1-2 tablets by mouth  every 6 (six) hours as needed for headache. 45 tablet 4 prn  . diphenhydrAMINE (BENADRYL) 25 MG tablet Take 2 tablets (50 mg total) by mouth at bedtime. (Patient not taking: Reported on 01/02/2016) 90 tablet 4 Not Taking at Unknown time  . glycopyrrolate (ROBINUL) 1 MG tablet Take 1 tablet (1 mg total) by mouth 3 (three) times daily. (Patient not taking: Reported on 11/16/2015) 90 tablet 1 Not Taking  . ondansetron (ZOFRAN) 4 MG tablet Take 1 tablet (4 mg total) by mouth daily as needed. (Patient taking differently: Take 4 mg by mouth daily as needed for nausea or vomiting. ) 30 tablet 1 01/01/2016 at Unknown time  . Prenatal Vit-Fe Phos-FA-Omega (VITAFOL GUMMIES) 3.33-0.333-34.8 MG CHEW Chew 3 tablets by mouth daily. 90 tablet 12 01/01/2016 at Unknown time    Review of Systems  Constitutional: Negative for fever and malaise/fatigue.  Gastrointestinal: Positive for nausea, vomiting and diarrhea. Negative for abdominal pain and constipation.  Genitourinary: Negative for dysuria, urgency and frequency.       Neg -vaginal bleeding, discharge, LOF   Physical Exam   Blood pressure 122/66, pulse 109, temperature 98.5 F (36.9 C), temperature source Oral, resp. rate 20, height 5\' 2"  (1.575 m), weight 138 lb (62.596 kg), last menstrual period 07/01/2015, unknown if currently breastfeeding.  Physical Exam  Nursing note and vitals reviewed. Constitutional: She  is oriented to person, place, and time. She appears well-developed and well-nourished. No distress.  HENT:  Head: Normocephalic and atraumatic.  Cardiovascular: Normal rate.   Respiratory: Effort normal.  GI: Soft. She exhibits no distension and no mass. There is no tenderness. There is no rebound and no guarding.  Neurological: She is alert and oriented to person, place, and time.  Skin: Skin is warm and dry. No erythema.  Psychiatric: She has a normal mood and affect.    Results for orders placed or performed during the hospital encounter of  01/03/16 (from the past 24 hour(s))  Urinalysis, Routine w reflex microscopic (not at [facility])     Status: Abnormal   Collection Time: 01/03/16  6:30 AM  Result Value Ref Range   Color, Urine YELLOW YELLOW   APPearance HAZY (A) CLEAR   Specific Gravity, Urine >1.030 (H) 1.005 - 1.030   pH 6.0 5.0 - 8.0   Glucose, UA NEGATIVE NEGATIVE mg/dL   Hgb urine dipstick NEGATIVE NEGATIVE   Bilirubin Urine SMALL (A) NEGATIVE   Ketones, ur >80 (A) NEGATIVE mg/dL   Protein, ur 30 (A) NEGATIVE mg/dL   Nitrite NEGATIVE NEGATIVE   Leukocytes, UA NEGATIVE NEGATIVE  Urine microscopic-add on     Status: Abnormal   Collection Time: 01/03/16  6:30 AM  Result Value Ref Range   Squamous Epithelial / LPF 6-30 (A) NONE SEEN   WBC, UA 0-5 0 - 5 WBC/hpf   RBC / HPF NONE SEEN 0 - 5 RBC/hpf   Bacteria, UA FEW (A) NONE SEEN   Fetal Monitoring: Baseline: 140 bpm Variability: moderate Accelerations: 10 x 10 Decelerations: none Contractions: none  MAU Course  Procedures None  MDM UA today shows dehydration IV LR with 4 mg Zofran given - patient reports significant improvement in nausea with Zofran. Patient offered a second liter of IV fluids due to significant hydration. She may have to leave to take her kids to school.  0800 - care turned over to Judeth Horn, NP. Patient receiving IV fluids.   Sandra Lowenstein, PA-C  01/03/2016, 7:58 AM  Assessment and Plan    Patient reports improvement in symptoms & is able to tolerate PO fluids.  Ready for discharge.   A:  1. Gastroenteritis     P: Discharge home Rx zofran 8 mg ODT Discussed reasons to return to MAU Advance diet as tolerated with bland foods  Judeth Horn, NP

## 2016-01-03 NOTE — MAU Note (Signed)
PT SAYS        SHE WAS HERE YESTERDAY  FOR  V/D -  GAVE  MED - PHENERGAN  .   VOMITED  0600-   .   ALSO DIARRHEA-   X 3  DAYS     FEELS   SOME  CRAMPS     .

## 2016-01-15 ENCOUNTER — Other Ambulatory Visit: Payer: Self-pay | Admitting: Certified Nurse Midwife

## 2016-01-17 ENCOUNTER — Other Ambulatory Visit: Payer: Self-pay

## 2016-01-17 ENCOUNTER — Encounter: Payer: Self-pay | Admitting: Certified Nurse Midwife

## 2016-01-22 ENCOUNTER — Telehealth: Payer: Self-pay | Admitting: *Deleted

## 2016-01-22 ENCOUNTER — Other Ambulatory Visit: Payer: Medicaid Other

## 2016-01-22 NOTE — ED Notes (Signed)
Pt being sent by Alwyn RenHopper MD c/o anorexia and SI x "several days."  Pt is [redacted] weeks pregnant.  Hopper MD reports Pt is "cleared regarding her pregnancy."

## 2016-01-22 NOTE — Telephone Encounter (Signed)
Patient called in Emergent call- she states she is very stressed and wants to be admitted to the hospital for the rest of her pregnancy. She is not eating and she feels she is a danger to herself and her baby. Talked to patient about admission to the hospital. [facility] asked patient if she could get to the hospital and she said she could explained that they would assess her mentally and then decide the care she needs- she is happy with that and states she will go to the hospital as soon as she gets child care. Told patient I would have Dr Clearance Cootsharper call over and let them know she is coming. Attempted to call patient back and got VM- LM that they were expecting her.

## 2016-01-23 ENCOUNTER — Other Ambulatory Visit: Payer: Medicaid Other

## 2016-01-23 ENCOUNTER — Encounter: Payer: Medicaid Other | Admitting: Certified Nurse Midwife

## 2016-01-24 ENCOUNTER — Telehealth: Payer: Self-pay | Admitting: *Deleted

## 2016-01-24 NOTE — Telephone Encounter (Signed)
Attempted to call patient since she did not go to the hospital or come to her appointment yesterday. Got no answer on either contact numbers. Left messages for patient to call the office. Called Arleen at [employer] to see if she can reach out to patient to check on her.

## 2016-01-25 ENCOUNTER — Encounter: Payer: Medicaid Other | Admitting: Certified Nurse Midwife

## 2016-01-26 ENCOUNTER — Ambulatory Visit (INDEPENDENT_AMBULATORY_CARE_PROVIDER_SITE_OTHER): Payer: Medicaid Other | Admitting: Certified Nurse Midwife

## 2016-01-26 VITALS — BP 113/76 | HR 101 | Temp 98.3°F | Wt 136.0 lb

## 2016-01-26 DIAGNOSIS — Z3493 Encounter for supervision of normal pregnancy, unspecified, third trimester: Secondary | ICD-10-CM

## 2016-01-26 DIAGNOSIS — O3433 Maternal care for cervical incompetence, third trimester: Secondary | ICD-10-CM

## 2016-01-26 DIAGNOSIS — O0993 Supervision of high risk pregnancy, unspecified, third trimester: Secondary | ICD-10-CM

## 2016-01-26 DIAGNOSIS — F32A Depression, unspecified: Secondary | ICD-10-CM

## 2016-01-26 DIAGNOSIS — F329 Major depressive disorder, single episode, unspecified: Secondary | ICD-10-CM

## 2016-01-26 LAB — POCT URINALYSIS DIPSTICK
Bilirubin, UA: NEGATIVE
Blood, UA: NEGATIVE
Glucose, UA: NEGATIVE
Leukocytes, UA: NEGATIVE
Nitrite, UA: NEGATIVE
PH UA: 7
SPEC GRAV UA: 1.01
Urobilinogen, UA: NEGATIVE

## 2016-01-26 MED ORDER — VITAFOL GUMMIES 3.33-0.333-34.8 MG PO CHEW: 3.0000 | CHEWABLE_TABLET | Freq: Every day | ORAL | Status: DC

## 2016-01-26 MED ORDER — SERTRALINE HCL 50 MG PO TABS: 50.0000 mg | ORAL_TABLET | Freq: Every day | ORAL | Status: DC

## 2016-01-26 NOTE — Progress Notes (Signed)
Patient states she was having some cramping last night- none today.

## 2016-01-26 NOTE — Progress Notes (Signed)
Subjective:    Sandra Matthews is a [age] y.o. female being seen today for her obstetrical visit. She is at 6334w6d gestation. Patient reports no bleeding, no contractions, no cramping, no leaking and depression, denies any suicidal or homicidal ideations, encouraged patient to keep prenatal appointments. Fetal movement: normal.  Problem List Items Addressed This Visit    None    Visit Diagnoses    Prenatal care, third trimester    -  Primary    Relevant Orders    POCT urinalysis dipstick (Completed)    High-risk pregnancy in third trimester        Relevant Medications    Prenatal Vit-Fe Phos-FA-Omega (VITAFOL GUMMIES) 3.33-0.333-34.8 MG CHEW    Depression        Relevant Medications    sertraline (ZOLOFT) 50 MG tablet      Patient Active Problem List   Diagnosis Date Noted  . History of preterm delivery, currently pregnant in second trimester 11/18/2015  . Late prenatal care 11/18/2015   Objective:    BP 113/76 mmHg  Pulse 101  Temp(Src) 98.3 F (36.8 C)  Wt 136 lb (61.689 kg)  LMP 07/01/2015 FHT:  145 BPM  Uterine Size: size equals dates  Presentation: cephalic     Assessment:    Pregnancy @ 134w6d weeks   Limited prenatal care in pregnancy  Depression  Plan:     labs reviewed, problem list updated Consent signed. GBS planning TDAP offered  Rhogam given for RH negative Pediatrician: discussed. Infant feeding: plans to breastfeed. Maternity leave: discussed. Cigarette smoking: never smoked. Orders Placed This Encounter  Procedures  . POCT urinalysis dipstick   Meds ordered this encounter  Medications  . sertraline (ZOLOFT) 50 MG tablet    Sig: Take 1 tablet (50 mg total) by mouth daily.    Dispense:  30 tablet    Refill:  2  . Prenatal Vit-Fe Phos-FA-Omega (VITAFOL GUMMIES) 3.33-0.333-34.8 MG CHEW    Sig: Chew 3 tablets by mouth daily.    Dispense:  90 tablet    Refill:  12   Follow up in 1 Week with OGTT.

## 2016-01-31 ENCOUNTER — Other Ambulatory Visit: Payer: Medicaid Other

## 2016-01-31 ENCOUNTER — Encounter: Payer: Medicaid Other | Admitting: Certified Nurse Midwife

## 2016-02-08 ENCOUNTER — Other Ambulatory Visit: Payer: Medicaid Other

## 2016-02-08 ENCOUNTER — Ambulatory Visit (INDEPENDENT_AMBULATORY_CARE_PROVIDER_SITE_OTHER): Payer: Medicaid Other | Admitting: Certified Nurse Midwife

## 2016-02-08 VITALS — BP 107/63 | HR 87 | Temp 98.3°F | Wt 133.0 lb

## 2016-02-08 DIAGNOSIS — O0993 Supervision of high risk pregnancy, unspecified, third trimester: Secondary | ICD-10-CM

## 2016-02-08 DIAGNOSIS — O09213 Supervision of pregnancy with history of pre-term labor, third trimester: Secondary | ICD-10-CM | POA: Diagnosis not present

## 2016-02-08 LAB — POCT URINALYSIS DIPSTICK
BILIRUBIN UA: NEGATIVE
Blood, UA: NEGATIVE
GLUCOSE UA: NEGATIVE
Leukocytes, UA: NEGATIVE
NITRITE UA: NEGATIVE
Protein, UA: NEGATIVE
Spec Grav, UA: 1.02
UROBILINOGEN UA: NEGATIVE
pH, UA: 6

## 2016-02-08 NOTE — Progress Notes (Signed)
Patient has no concerns 

## 2016-02-08 NOTE — Addendum Note (Signed)
Addended by: Marya LandryFOSTER, Tywon Niday D on: 02/08/2016 10:48 AM   Modules accepted: Orders

## 2016-02-08 NOTE — Progress Notes (Signed)
Subjective:    Sandra Matthews is a 20 y.o. female being seen today for her obstetrical visit. She is at 4367w5d gestation. Patient reports no complaints. Fetal movement: normal.  Problem List Items Addressed This Visit    None    Visit Diagnoses    Supervision of high risk pregnancy in third trimester    -  Primary    Relevant Orders    US MFM OB FOLLOW UP      Patient Active Problem List   Diagnosis Date Noted  . History of preterm delivery, currently pregnant in second trimester 11/18/2015  . Late prenatal care 11/18/2015   Objective:    BP 107/63 mmHg  Pulse 87  Temp(Src) 98.3 F (36.8 C)  Wt 133 lb (60.328 kg)  LMP 07/01/2015 FHT:  150 BPM  Uterine Size: size equals dates  Presentation: cephalic   Vomiting after 2nd hour of OGTT, blood drawn.    Assessment:    Pregnancy @ 5367w5d weeks   H/O PTD  Depression: on zoloft  On 17-P injections  N&V with OGTT: completed 1st hour  Plan:     labs reviewed, problem list updated Consent signed. GBS planning TDAP offered  Rhogam given for RH negative Pediatrician: discussed. Infant feeding: plans to breastfeed. Maternity leave: N/A. Cigarette smoking: quit at start of pregnancy. Orders Placed This Encounter  Procedures  . US MFM OB FOLLOW UP    Standing Status: Future     Number of Occurrences:      Standing Expiration Date: 04/09/2017    Order Specific Question:  Reason for Exam (SYMPTOM  OR DIAGNOSIS REQUIRED)    Answer:  cervical shortening, hx of PTD    Order Specific Question:  Preferred imaging location?    Answer:  [facility]   No orders of the defined types were placed in this encounter.   Follow up in 2 Weeks.

## 2016-02-12 ENCOUNTER — Encounter: Payer: Self-pay | Admitting: *Deleted

## 2016-02-15 ENCOUNTER — Ambulatory Visit (HOSPITAL_COMMUNITY): Admission: RE | Admit: 2016-02-15 | Payer: Medicaid Other | Source: Ambulatory Visit

## 2016-02-15 LAB — HEMOGLOBIN A1C
ESTIMATED AVERAGE GLUCOSE: 114 mg/dL
HEMOGLOBIN A1C: 5.6 % (ref 4.8–5.6)

## 2016-02-15 LAB — HIV ANTIBODY (ROUTINE TESTING W REFLEX): HIV SCREEN 4TH GENERATION: NONREACTIVE

## 2016-02-15 LAB — CBC
Hematocrit: 31.4 % — ABNORMAL LOW (ref 34.0–46.6)
Hemoglobin: 10.4 g/dL — ABNORMAL LOW (ref 11.1–15.9)
MCH: 29.6 pg (ref 26.6–33.0)
MCHC: 33.1 g/dL (ref 31.5–35.7)
MCV: 90 fL (ref 79–97)
PLATELETS: 433 10*3/uL — AB (ref 150–379)
RBC: 3.51 x10E6/uL — ABNORMAL LOW (ref 3.77–5.28)
RDW: 14 % (ref 12.3–15.4)
WBC: 11.5 10*3/uL — AB (ref 3.4–10.8)

## 2016-02-15 LAB — GLUCOSE TOLERANCE, 2 HOURS W/ 1HR
GLUCOSE, 1 HOUR: 141 mg/dL (ref 65–179)
Glucose, Fasting: 82 mg/dL (ref 65–91)

## 2016-02-15 LAB — RPR: RPR: NONREACTIVE

## 2016-02-22 ENCOUNTER — Encounter: Payer: Medicaid Other | Admitting: Certified Nurse Midwife

## 2016-02-23 ENCOUNTER — Encounter: Payer: Medicaid Other | Admitting: Certified Nurse Midwife

## 2016-03-19 ENCOUNTER — Ambulatory Visit (INDEPENDENT_AMBULATORY_CARE_PROVIDER_SITE_OTHER): Payer: Medicaid Other | Admitting: Certified Nurse Midwife

## 2016-03-19 VITALS — BP 102/60 | HR 95 | Temp 98.2°F | Wt 140.0 lb

## 2016-03-19 DIAGNOSIS — O99613 Diseases of the digestive system complicating pregnancy, third trimester: Secondary | ICD-10-CM

## 2016-03-19 DIAGNOSIS — Z3493 Encounter for supervision of normal pregnancy, unspecified, third trimester: Secondary | ICD-10-CM

## 2016-03-19 DIAGNOSIS — K219 Gastro-esophageal reflux disease without esophagitis: Secondary | ICD-10-CM

## 2016-03-19 LAB — POCT URINALYSIS DIPSTICK
Bilirubin, UA: NEGATIVE
GLUCOSE UA: NEGATIVE
Ketones, UA: NEGATIVE
Nitrite, UA: NEGATIVE
RBC UA: NEGATIVE
Spec Grav, UA: 1.015
UROBILINOGEN UA: NEGATIVE
pH, UA: 7

## 2016-03-19 LAB — OB RESULTS CONSOLE GBS: GBS: POSITIVE

## 2016-03-19 MED ORDER — OMEPRAZOLE 20 MG PO CPDR: 20.0000 mg | DELAYED_RELEASE_CAPSULE | Freq: Two times a day (BID) | ORAL | Status: DC

## 2016-03-19 NOTE — Progress Notes (Signed)
Subjective:    Sandra Matthews is a 20 y.o. female being seen today for her obstetrical visit. She is at 6551w3d gestation. Patient reports heartburn, no bleeding, no contractions, no cramping and no leaking. Fetal movement: normal.  States depression is doing better with Zoloft.    Problem List Items Addressed This Visit    None    Visit Diagnoses    Prenatal care, third trimester    -  Primary    Relevant Orders    POCT urinalysis dipstick (Completed)    Strep Gp B NAA    Gastroesophageal reflux in pregancy in third trimester        Relevant Medications    omeprazole (PRILOSEC) 20 MG capsule      Patient Active Problem List   Diagnosis Date Noted  . History of preterm delivery, currently pregnant in second trimester 11/18/2015  . Late prenatal care 11/18/2015    Objective:    BP 102/60 mmHg  Pulse 95  Temp(Src) 98.2 F (36.8 C)  Wt 140 lb (63.504 kg)  LMP 07/01/2015 FHT: 150 BPM  Uterine Size: size equals dates  Presentations: cephalic  Pelvic Exam:              Dilation: 2cm       Effacement: 50%             Station:  -2    Consistency: soft            Position: middle     Assessment:    Pregnancy @ 7651w3d weeks   Reflux in pregnancy  Plan:   Plans for delivery: Vaginal anticipated; labs reviewed; problem list updated Counseling: Consent signed. Infant feeding: plans to breastfeed. Cigarette smoking: never smoked. L&D discussion: symptoms of labor, discussed when to call, discussed what number to call, anesthetic/analgesic options reviewed and delivering clinician:  plans no preference. Postpartum supports and preparation: circumcision discussed and contraception plans discussed.  Follow up in 1 Week.

## 2016-03-19 NOTE — Addendum Note (Signed)
Addended by: Marya LandryFOSTER, Adalida Garver D on: 03/19/2016 05:04 PM   Modules accepted: Orders

## 2016-03-21 ENCOUNTER — Other Ambulatory Visit: Payer: Self-pay | Admitting: Certified Nurse Midwife

## 2016-03-21 LAB — STREP GP B NAA: Strep Gp B NAA: POSITIVE — AB

## 2016-03-22 ENCOUNTER — Other Ambulatory Visit: Payer: Self-pay | Admitting: Certified Nurse Midwife

## 2016-03-22 DIAGNOSIS — B9689 Other specified bacterial agents as the cause of diseases classified elsewhere: Secondary | ICD-10-CM

## 2016-03-22 DIAGNOSIS — N760 Acute vaginitis: Principal | ICD-10-CM

## 2016-03-22 LAB — NUSWAB VG+, CANDIDA 6SP
ATOPOBIUM VAGINAE: HIGH {score} — AB
CANDIDA ALBICANS, NAA: NEGATIVE
CANDIDA KRUSEI, NAA: NEGATIVE
CANDIDA LUSITANIAE, NAA: NEGATIVE
CANDIDA PARAPSILOSIS, NAA: NEGATIVE
CHLAMYDIA TRACHOMATIS, NAA: NEGATIVE
Candida glabrata, NAA: NEGATIVE
Candida tropicalis, NAA: NEGATIVE
Megasphaera 1: HIGH Score — AB
Neisseria gonorrhoeae, NAA: NEGATIVE
TRICH VAG BY NAA: NEGATIVE

## 2016-03-22 MED ORDER — METRONIDAZOLE 500 MG PO TABS: 500.0000 mg | ORAL_TABLET | Freq: Two times a day (BID) | ORAL | Status: DC

## 2016-03-23 ENCOUNTER — Encounter (HOSPITAL_COMMUNITY): Payer: Self-pay | Admitting: Certified Nurse Midwife

## 2016-03-23 ENCOUNTER — Inpatient Hospital Stay (HOSPITAL_COMMUNITY)
Admission: AD | Admit: 2016-03-23 | Discharge: 2016-03-26 | DRG: 775 | Disposition: A | Discharge status: Home / Self Care | Payer: Medicaid Other | Source: Ambulatory Visit | Attending: Obstetrics | Admitting: Obstetrics

## 2016-03-23 DIAGNOSIS — O99824 Streptococcus B carrier state complicating childbirth: Secondary | ICD-10-CM | POA: Diagnosis present

## 2016-03-23 DIAGNOSIS — O9902 Anemia complicating childbirth: Secondary | ICD-10-CM | POA: Diagnosis present

## 2016-03-23 DIAGNOSIS — Z3A38 38 weeks gestation of pregnancy: Secondary | ICD-10-CM

## 2016-03-23 DIAGNOSIS — Z818 Family history of other mental and behavioral disorders: Secondary | ICD-10-CM | POA: Diagnosis not present

## 2016-03-23 DIAGNOSIS — O4290 Premature rupture of membranes, unspecified as to length of time between rupture and onset of labor, unspecified weeks of gestation: Secondary | ICD-10-CM | POA: Diagnosis present

## 2016-03-23 DIAGNOSIS — D649 Anemia, unspecified: Secondary | ICD-10-CM | POA: Diagnosis present

## 2016-03-23 DIAGNOSIS — O4202 Full-term premature rupture of membranes, onset of labor within 24 hours of rupture: Principal | ICD-10-CM | POA: Diagnosis present

## 2016-03-23 LAB — CBC
HCT: 31.7 % — ABNORMAL LOW (ref 36.0–46.0)
Hemoglobin: 10.8 g/dL — ABNORMAL LOW (ref 12.0–15.0)
MCH: 29.7 pg (ref 26.0–34.0)
MCHC: 34.1 g/dL (ref 30.0–36.0)
MCV: 87.1 fL (ref 78.0–100.0)
PLATELETS: 387 10*3/uL (ref 150–400)
RBC: 3.64 MIL/uL — ABNORMAL LOW (ref 3.87–5.11)
RDW: 13.7 % (ref 11.5–15.5)
WBC: 12.1 10*3/uL — AB (ref 4.0–10.5)

## 2016-03-23 MED ORDER — FLEET ENEMA 7-19 GM/118ML RE ENEM: 1.0000 | ENEMA | RECTAL | Status: DC | PRN

## 2016-03-23 MED ORDER — LIDOCAINE HCL (PF) 1 % IJ SOLN
30.0000 mL | INTRAMUSCULAR | Status: DC | PRN
  Filled 2016-03-23: qty 30

## 2016-03-23 MED ORDER — OXYCODONE-ACETAMINOPHEN 5-325 MG PO TABS: 2.0000 | ORAL_TABLET | ORAL | Status: DC | PRN

## 2016-03-23 MED ORDER — SOD CITRATE-CITRIC ACID 500-334 MG/5ML PO SOLN: 30.0000 mL | ORAL | Status: DC | PRN

## 2016-03-23 MED ORDER — PROMETHAZINE HCL 25 MG/ML IJ SOLN
25.0000 mg | Freq: Four times a day (QID) | INTRAMUSCULAR | Status: DC | PRN
  Administered 2016-03-24: 25 mg via INTRAMUSCULAR
  Filled 2016-03-23: qty 1

## 2016-03-23 MED ORDER — NALBUPHINE HCL 10 MG/ML IJ SOLN
10.0000 mg | Freq: Four times a day (QID) | INTRAMUSCULAR | Status: DC | PRN
  Administered 2016-03-24: 10 mg via INTRAMUSCULAR
  Filled 2016-03-23: qty 1

## 2016-03-23 MED ORDER — OXYTOCIN 40 UNITS IN LACTATED RINGERS INFUSION - SIMPLE MED
2.5000 [IU]/h | INTRAVENOUS | Status: DC
  Administered 2016-03-24: 2.5 [IU]/h via INTRAVENOUS

## 2016-03-23 MED ORDER — OXYTOCIN 40 UNITS IN LACTATED RINGERS INFUSION - SIMPLE MED
1.0000 m[IU]/min | INTRAVENOUS | Status: DC
Start: 2016-03-23 — End: 2016-03-24
  Administered 2016-03-23: 1 m[IU]/min via INTRAVENOUS
  Filled 2016-03-23: qty 1000

## 2016-03-23 MED ORDER — PENICILLIN G POTASSIUM 5000000 UNITS IJ SOLR
5.0000 10*6.[IU] | Freq: Once | INTRAVENOUS | Status: AC
  Administered 2016-03-23: 5 10*6.[IU] via INTRAVENOUS
  Filled 2016-03-23: qty 5

## 2016-03-23 MED ORDER — TERBUTALINE SULFATE 1 MG/ML IJ SOLN
0.2500 mg | Freq: Once | INTRAMUSCULAR | Status: DC | PRN
  Filled 2016-03-23: qty 1

## 2016-03-23 MED ORDER — NALBUPHINE HCL 10 MG/ML IJ SOLN
10.0000 mg | INTRAMUSCULAR | Status: DC | PRN
  Administered 2016-03-24 (×2): 10 mg via INTRAVENOUS
  Filled 2016-03-23 (×2): qty 1

## 2016-03-23 MED ORDER — ACETAMINOPHEN 325 MG PO TABS: 650.0000 mg | ORAL_TABLET | ORAL | Status: DC | PRN

## 2016-03-23 MED ORDER — LACTATED RINGERS IV SOLN
INTRAVENOUS | Status: DC
  Administered 2016-03-23 – 2016-03-24 (×3): via INTRAVENOUS

## 2016-03-23 MED ORDER — ONDANSETRON HCL 4 MG/2ML IJ SOLN
4.0000 mg | Freq: Four times a day (QID) | INTRAMUSCULAR | Status: DC | PRN
  Administered 2016-03-23 – 2016-03-24 (×2): 4 mg via INTRAVENOUS
  Filled 2016-03-23 (×2): qty 2

## 2016-03-23 MED ORDER — LACTATED RINGERS IV SOLN: 500.0000 mL | INTRAVENOUS | Status: DC | PRN

## 2016-03-23 MED ORDER — OXYTOCIN BOLUS FROM INFUSION: 500.0000 mL | INTRAVENOUS | Status: DC

## 2016-03-23 MED ORDER — PENICILLIN G POTASSIUM 5000000 UNITS IJ SOLR
2.5000 10*6.[IU] | INTRAVENOUS | Status: DC
  Administered 2016-03-24 (×4): 2.5 10*6.[IU] via INTRAVENOUS
  Filled 2016-03-23 (×7): qty 2.5

## 2016-03-23 MED ORDER — OXYCODONE-ACETAMINOPHEN 5-325 MG PO TABS: 1.0000 | ORAL_TABLET | ORAL | Status: DC | PRN

## 2016-03-23 NOTE — H&P (Signed)
Sandra Matthews is a 20 y.o. [name] presenting for leaking of fluid since yesterday. Maternal Medical History:  Reason for admission: Rupture of membranes.   Fetal activity: Perceived fetal activity is normal.    Prenatal complications: no prenatal complications Prenatal Complications - Diabetes: none.    OB History    Gravida Para Term Preterm AB TAB SAB Ectopic Multiple Living   2 1 0 1 0 0 0 0 0 1      Past Medical History  Diagnosis Date  . No pertinent past medical history   . Preterm labor    Past Surgical History  Procedure Laterality Date  . Fracture surgery     Family History: family history includes Anxiety disorder in her father; Bipolar disorder in her father; Depression in her father. Social History:  reports that she has never smoked. She has never used smokeless tobacco. She reports that she does not drink alcohol or use illicit drugs.   Prenatal Transfer Tool  Maternal Diabetes: No Genetic Screening: Normal Maternal Ultrasounds/Referrals: Normal Fetal Ultrasounds or other Referrals:  None Maternal Substance Abuse:  No Significant Maternal Medications:  None Significant Maternal Lab Results:  Lab values include: Group B Strep positive Other Comments:  None  Review of Systems  All other systems reviewed and are negative.   Dilation: 5.5 Effacement (%): 80 Station: -1 Exam by:: Craige CottaAmanda Loye, RN Blood pressure 114/74, pulse 88, temperature 98.6 F (37 C), temperature source Oral, resp. rate 16, height 5\' 1"  (1.549 m), weight 137 lb (62.143 kg), last menstrual period 07/01/2015, unknown if currently breastfeeding. Maternal Exam:  Abdomen: Patient reports no abdominal tenderness. Fetal presentation: vertex  Introitus: Normal vulva. Normal vagina.  Cervix: Cervix evaluated by digital exam.     Physical Exam  Nursing note and vitals reviewed. Constitutional: She is oriented to person, place, and time. She appears well-developed and well-nourished.   HENT:  Head: Normocephalic and atraumatic.  Eyes: Conjunctivae are normal. Pupils are equal, round, and reactive to light.  Neck: Normal range of motion. Neck supple.  Cardiovascular: Normal rate and regular rhythm.   Respiratory: Effort normal and breath sounds normal.  GI: Soft.  Genitourinary: Vagina normal and uterus normal.  Musculoskeletal: Normal range of motion.  Neurological: She is alert and oriented to person, place, and time.  Skin: Skin is warm and dry.  Psychiatric: She has a normal mood and affect. Her behavior is normal. Judgment and thought content normal.    Prenatal labs: ABO, Rh: --/--/O POS (06/03 2015) Antibody: PENDING (06/03 2015) Rubella: 3.06 (01/26 1340) RPR: Non Reactive (04/20 0945)  HBsAg: NEGATIVE (01/26 1340)  HIV: Non Reactive (04/20 0945)  GBS: Positive (05/30 1556)   Assessment/Plan: 38 weeks.  PROM.  Admit.  Pitocin prn.   Sandra Matthews A 03/23/2016, 9:21 PM

## 2016-03-23 NOTE — MAU Note (Signed)
Pt states she began leaking clear fluid yesterday afternoon at 1600. Pt denies bleeding or ctxs. Pt states she has pelvic pressure. Fetus active.

## 2016-03-24 ENCOUNTER — Encounter (HOSPITAL_COMMUNITY): Payer: Self-pay | Admitting: *Deleted

## 2016-03-24 LAB — RPR: RPR Ser Ql: NONREACTIVE

## 2016-03-24 MED ORDER — TETANUS-DIPHTH-ACELL PERTUSSIS 5-2.5-18.5 LF-MCG/0.5 IM SUSP
0.5000 mL | Freq: Once | INTRAMUSCULAR | Status: AC
  Administered 2016-03-25: 0.5 mL via INTRAMUSCULAR
  Filled 2016-03-24: qty 0.5

## 2016-03-24 MED ORDER — SIMETHICONE 80 MG PO CHEW: 80.0000 mg | CHEWABLE_TABLET | ORAL | Status: DC | PRN

## 2016-03-24 MED ORDER — OXYTOCIN 40 UNITS IN LACTATED RINGERS INFUSION - SIMPLE MED: 2.5000 [IU]/h | INTRAVENOUS | Status: DC | PRN

## 2016-03-24 MED ORDER — ONDANSETRON HCL 4 MG/2ML IJ SOLN: 4.0000 mg | INTRAMUSCULAR | Status: DC | PRN

## 2016-03-24 MED ORDER — ONDANSETRON HCL 4 MG PO TABS: 4.0000 mg | ORAL_TABLET | ORAL | Status: DC | PRN

## 2016-03-24 MED ORDER — WITCH HAZEL-GLYCERIN EX PADS
1.0000 "application " | MEDICATED_PAD | CUTANEOUS | Status: DC | PRN
  Administered 2016-03-26: 1 via TOPICAL

## 2016-03-24 MED ORDER — DIPHENHYDRAMINE HCL 25 MG PO CAPS: 25.0000 mg | ORAL_CAPSULE | Freq: Four times a day (QID) | ORAL | Status: DC | PRN

## 2016-03-24 MED ORDER — ACETAMINOPHEN 325 MG PO TABS: 650.0000 mg | ORAL_TABLET | ORAL | Status: DC | PRN

## 2016-03-24 MED ORDER — ZOLPIDEM TARTRATE 5 MG PO TABS: 5.0000 mg | ORAL_TABLET | Freq: Every evening | ORAL | Status: DC | PRN

## 2016-03-24 MED ORDER — SENNOSIDES-DOCUSATE SODIUM 8.6-50 MG PO TABS
2.0000 | ORAL_TABLET | ORAL | Status: DC
  Administered 2016-03-25 (×2): 2 via ORAL
  Filled 2016-03-24 (×2): qty 2

## 2016-03-24 MED ORDER — METHYLERGONOVINE MALEATE 0.2 MG PO TABS
0.2000 mg | ORAL_TABLET | Freq: Three times a day (TID) | ORAL | Status: AC
  Administered 2016-03-24 – 2016-03-26 (×6): 0.2 mg via ORAL
  Filled 2016-03-24 (×7): qty 1

## 2016-03-24 MED ORDER — SERTRALINE HCL 50 MG PO TABS
50.0000 mg | ORAL_TABLET | Freq: Every day | ORAL | Status: DC
  Filled 2016-03-24 (×2): qty 1

## 2016-03-24 MED ORDER — MEDROXYPROGESTERONE ACETATE 150 MG/ML IM SUSP: 150.0000 mg | INTRAMUSCULAR | Status: DC | PRN

## 2016-03-24 MED ORDER — IBUPROFEN 600 MG PO TABS
600.0000 mg | ORAL_TABLET | Freq: Four times a day (QID) | ORAL | Status: DC
  Administered 2016-03-24 – 2016-03-26 (×7): 600 mg via ORAL
  Filled 2016-03-24 (×8): qty 1

## 2016-03-24 MED ORDER — COCONUT OIL OIL
1.0000 "application " | TOPICAL_OIL | Status: DC | PRN
  Filled 2016-03-24: qty 120

## 2016-03-24 MED ORDER — PRENATAL MULTIVITAMIN CH
1.0000 | ORAL_TABLET | Freq: Every day | ORAL | Status: DC
  Administered 2016-03-25: 1 via ORAL
  Filled 2016-03-24 (×2): qty 1

## 2016-03-24 MED ORDER — METHYLERGONOVINE MALEATE 0.2 MG/ML IJ SOLN: 0.2000 mg | Freq: Three times a day (TID) | INTRAMUSCULAR | Status: AC

## 2016-03-24 MED ORDER — BENZOCAINE-MENTHOL 20-0.5 % EX AERO: 1.0000 "application " | INHALATION_SPRAY | CUTANEOUS | Status: DC | PRN

## 2016-03-24 MED ORDER — DIBUCAINE 1 % RE OINT
1.0000 "application " | TOPICAL_OINTMENT | RECTAL | Status: DC | PRN
  Administered 2016-03-26: 1 via RECTAL
  Filled 2016-03-24: qty 28

## 2016-03-24 MED ORDER — OXYCODONE-ACETAMINOPHEN 5-325 MG PO TABS
1.0000 | ORAL_TABLET | ORAL | Status: DC | PRN
  Administered 2016-03-25 – 2016-03-26 (×7): 1 via ORAL
  Filled 2016-03-24 (×7): qty 1

## 2016-03-24 MED ORDER — OXYCODONE-ACETAMINOPHEN 5-325 MG PO TABS: 2.0000 | ORAL_TABLET | ORAL | Status: DC | PRN

## 2016-03-24 NOTE — Progress Notes (Addendum)
Sandra Matthews is a 20 y.o. G2P0101 at 5722w1d by LMP admitted for rupture of membranes  Subjective:   Objective: BP 121/76 mmHg  Pulse 79  Temp(Src) 98.1 F (36.7 C) (Axillary)  Resp 18  Ht 5\' 1"  (1.549 m)  Wt 137 lb (62.143 kg)  BMI 25.90 kg/m2  LMP 07/01/2015      FHT:  FHR: 135 bpm, variability: moderate,  accelerations:  Present,  decelerations:  Absent UC:   regular, every 2-3 minutes SVE:   5-6cm / 80% / -1 / Vtx            IUPC inserted  Labs: Lab Results  Component Value Date   WBC 12.1* 03/23/2016   HGB 10.8* 03/23/2016   HCT 31.7* 03/23/2016   MCV 87.1 03/23/2016   PLT 387 03/23/2016    Assessment / Plan: Protracted active phase.  IUPC inserted.  Increase pitocin for 180-240 MV units toco.  Labor: Slow active phase Preeclampsia:  n/a Fetal Wellbeing:  n/a Pain Control:  IV pain meds I/D:  n/a Anticipated MOD:  NSVD  Sandra Matthews A 03/24/2016, 8:38 AM

## 2016-03-24 NOTE — Lactation Note (Signed)
This note was copied from a baby's chart. Lactation Consultation Note  Patient Name: Sandra Matthews'BToday's Date: 03/24/2016 Reason for consult: Initial assessment Baby at 6 hr of life. Mom reports baby is latching well. She bf her older child for about a month because he had trouble latching. She would life to bf this baby for 6 m. She denies breast or nipple pain, voiced no concerns. Discussed baby behavior, feeding frequency, baby belly size, voids, wt loss, breast changes, and nipple care. She stated she can manually express, has seen colostrum bilaterally, and has a spoon in the room. Given lactation handouts. Aware of OP services and support group.    Maternal Data Formula Feeding for Exclusion: No Has patient been taught Hand Expression?: Yes Does the patient have breastfeeding experience prior to this delivery?: Yes  Feeding Feeding Type: Breast Fed Length of feed: 5 min  LATCH Score/Interventions Latch: Repeated attempts needed to sustain latch, nipple held in mouth throughout feeding, stimulation needed to elicit sucking reflex. Intervention(s): Adjust position;Assist with latch  Audible Swallowing: None Intervention(s): Skin to skin  Type of Nipple: Everted at rest and after stimulation  Comfort (Breast/Nipple): Soft / non-tender     Hold (Positioning): Assistance needed to correctly position infant at breast and maintain latch. Intervention(s): Support Pillows  LATCH Score: 6  Lactation Tools Discussed/Used WIC Program: Yes   Consult Status Consult Status: Follow-up Date: 03/25/16 Follow-up type: In-patient    Rulon Eisenmengerlizabeth E Tasharra Nodine 03/24/2016, 8:58 PM

## 2016-03-24 NOTE — Progress Notes (Signed)
Sandra Matthews is a 20 y.o. G2P0101 at 8752w1d by LMP admitted for active labor  Subjective:   Objective: BP 97/58 mmHg  Pulse 81  Temp(Src) 98.1 F (36.7 C) (Oral)  Resp 15  Ht 5\' 1"  (1.549 m)  Wt 137 lb (62.143 kg)  BMI 25.90 kg/m2  LMP 07/01/2015      FHT:  FHR: 135 bpm, variability: moderate,  accelerations:  Present,  decelerations:  Absent UC:   regular, every 2-3 minutes SVE:   Dilation: 6 Effacement (%): 80 Station: -1 Exam by:: Craige CottaAmanda Loye, RN  Labs: Lab Results  Component Value Date   WBC 12.1* 03/23/2016   HGB 10.8* 03/23/2016   HCT 31.7* 03/23/2016   MCV 87.1 03/23/2016   PLT 387 03/23/2016    Assessment / Plan: Active labor.  Increase pitocin prn.  Labor: Progressing normally Preeclampsia:  n/a Fetal Wellbeing:  Category I Pain Control:  IV pain meds I/D:  n/a Anticipated MOD:  NSVD  HARPER,CHARLES A 03/24/2016, 4:01 AM

## 2016-03-24 NOTE — Progress Notes (Signed)
Sandra Matthews is a 20 y.o. G2P0101 at 5564w1d by LMP admitted for rupture of membranes  Subjective:   Objective: BP 123/71 mmHg  Pulse 61  Temp(Src) 98 F (36.7 C) (Axillary)  Resp 20  Ht 5\' 1"  (1.549 m)  Wt 137 lb (62.143 kg)  BMI 25.90 kg/m2  LMP 07/01/2015      FHT:  FHR: 130 bpm, variability: moderate,  accelerations:  Present,  decelerations:  Absent UC:   regular, every 2-3 minutes SVE:   Dilation: 7.5 Effacement (%): 90 Station: 0, +1 Exam by:: [email or web address]  Labs: Lab Results  Component Value Date   WBC 12.1* 03/23/2016   HGB 10.8* 03/23/2016   HCT 31.7* 03/23/2016   MCV 87.1 03/23/2016   PLT 387 03/23/2016    Assessment / Plan: Augmentation of labor, progressing well  Labor: Progressing normally Preeclampsia:  n/a Fetal Wellbeing:  Category I Pain Control:  IV pain meds I/D:  n/a Anticipated MOD:  NSVD  HARPER,CHARLES A 03/24/2016, 11:19 AM

## 2016-03-25 LAB — CBC
HCT: 25.6 % — ABNORMAL LOW (ref 36.0–46.0)
Hemoglobin: 8.7 g/dL — ABNORMAL LOW (ref 12.0–15.0)
MCH: 29.7 pg (ref 26.0–34.0)
MCHC: 34 g/dL (ref 30.0–36.0)
MCV: 87.4 fL (ref 78.0–100.0)
PLATELETS: 301 10*3/uL (ref 150–400)
RBC: 2.93 MIL/uL — AB (ref 3.87–5.11)
RDW: 13.6 % (ref 11.5–15.5)
WBC: 17.8 10*3/uL — ABNORMAL HIGH (ref 4.0–10.5)

## 2016-03-25 NOTE — Progress Notes (Signed)
Post Partum Day 1 Subjective: no complaints  Objective: Blood pressure 108/72, pulse 80, temperature 98.6 F (37 C), temperature source Oral, resp. rate 18, height 5\' 1"  (1.549 m), weight 137 lb (62.143 kg), last menstrual period 07/01/2015, SpO2 100 %, unknown if currently breastfeeding.  Physical Exam:  General: alert and no distress Lochia: appropriate Uterine Fundus: firm Incision: none DVT Evaluation: No evidence of DVT seen on physical exam.   Recent Labs  03/23/16 2015  HGB 10.8*  HCT 31.7*    Assessment/Plan: Doing well. Plan for discharge tomorrow   LOS: 2 days   HARPER,CHARLES A 03/25/2016, 4:51 AM

## 2016-03-25 NOTE — Lactation Note (Signed)
This note was copied from a baby's chart. Lactation Consultation Note: Mother independently latching infant on . Observed good depth with burst of audible swallows. Mother states that this infant is breastfeeding so much better than her last child. Discussed cluster feeding and supply and demand. Mother receptive to all teaching.   Patient Name: Sandra Melene MullerKavondea Gedeon ZOXWR'UToday's Date: 03/25/2016 Reason for consult: Follow-up assessment   Maternal Data    Feeding Feeding Type: Breast Fed Length of feed: 15 min  LATCH Score/Interventions Latch: Grasps breast easily, tongue down, lips flanged, rhythmical sucking. Intervention(s): Breast compression  Audible Swallowing: Spontaneous and intermittent Intervention(s): Skin to skin;Hand expression  Type of Nipple: Everted at rest and after stimulation  Comfort (Breast/Nipple): Filling, red/small blisters or bruises, mild/mod discomfort  Problem noted: Filling  Hold (Positioning): Assistance needed to correctly position infant at breast and maintain latch. Intervention(s): Skin to skin  LATCH Score: 8  Lactation Tools Discussed/Used     Consult Status Consult Status: Follow-up Date: 03/25/16 Follow-up type: In-patient    Stevan BornKendrick, [facility] 03/25/2016, 3:03 PM

## 2016-03-26 ENCOUNTER — Encounter: Payer: Medicaid Other | Admitting: Certified Nurse Midwife

## 2016-03-26 MED ORDER — IBUPROFEN 600 MG PO TABS: 600.0000 mg | ORAL_TABLET | Freq: Four times a day (QID) | ORAL | Status: DC

## 2016-03-26 MED ORDER — MEDROXYPROGESTERONE ACETATE 150 MG/ML IM SUSP: 150.0000 mg | INTRAMUSCULAR | Status: DC

## 2016-03-26 MED ORDER — POLYSACCHARIDE IRON COMPLEX 150 MG PO CAPS: 150.0000 mg | ORAL_CAPSULE | Freq: Every day | ORAL | Status: DC

## 2016-03-26 MED ORDER — POLYSACCHARIDE IRON COMPLEX 150 MG PO CAPS
150.0000 mg | ORAL_CAPSULE | Freq: Every day | ORAL | Status: DC
  Administered 2016-03-26: 150 mg via ORAL
  Filled 2016-03-26: qty 1

## 2016-03-26 MED ORDER — COCONUT OIL OIL: 1.0000 "application " | TOPICAL_OIL | Status: DC | PRN

## 2016-03-26 MED ORDER — SENNOSIDES-DOCUSATE SODIUM 8.6-50 MG PO TABS: 2.0000 | ORAL_TABLET | ORAL | Status: DC

## 2016-03-26 MED ORDER — MEDROXYPROGESTERONE ACETATE 150 MG/ML IM SUSP
150.0000 mg | INTRAMUSCULAR | Status: AC | PRN
  Administered 2016-03-26: 150 mg via INTRAMUSCULAR
  Filled 2016-03-26: qty 1

## 2016-03-26 MED ORDER — OXYCODONE-ACETAMINOPHEN 5-325 MG PO TABS: 1.0000 | ORAL_TABLET | ORAL | Status: DC | PRN

## 2016-03-26 NOTE — Lactation Note (Signed)
This note was copied from a baby's chart. Lactation Consultation Note  Baby < 6 lbs and was latching upon entering in cradle hold. Placed pillows behind mother's back and under baby for increased height and support. Encouraged mother to latch baby deep on breast.  Mother doing a good job of compressing breast during feeding. After 10 min baby fell asleep.  Reviewed waking techniques.  Suggest mother unlatch baby and burp to wake her to continue feeding. Provided mother w/ hand pump and suggest she post pump a few times a day and give volume pumped back to baby if baby is sleepy at feedings. Discussed feeding STS with hat on and breastfeed on both breasts. Reviewed engorgement care and monitoring voids/stools. Mom encouraged to feed baby 8-12 times/24 hours and with feeding cues at least every 3 hours.  Encouraged watching for swallows.  Reviewed milk storage.   Patient Name: Sandra Matthews Reason for consult: Follow-up assessment   Maternal Data    Feeding Feeding Type: Breast Fed Length of feed: 10 min  LATCH Score/Interventions Latch: Grasps breast easily, tongue down, lips flanged, rhythmical sucking. Intervention(s): Breast compression  Audible Swallowing: A few with stimulation Intervention(s): Alternate breast massage  Type of Nipple: Everted at rest and after stimulation  Comfort (Breast/Nipple): Soft / non-tender  Interventions (Filling): Hand pump;Firm support  Hold (Positioning): No assistance needed to correctly position infant at breast.  LATCH Score: 9  Lactation Tools Discussed/Used     Consult Status Consult Status: Complete    Hardie PulleyBerkelhammer, Kajol Crispen Boschen Matthews, 9:38 AM

## 2016-03-26 NOTE — Discharge Summary (Signed)
Obstetric Discharge Summary Reason for Admission: onset of labor Prenatal Procedures: ultrasound Intrapartum Procedures: spontaneous vaginal delivery Postpartum Procedures: none Complications-Operative and Postpartum: none HEMOGLOBIN  Date Value Ref Range Status  03/25/2016 8.7* 12.0 - 15.0 g/dL Final   HCT  Date Value Ref Range Status  03/25/2016 25.6* 36.0 - 46.0 % Final   HEMATOCRIT  Date Value Ref Range Status  02/08/2016 31.4* 34.0 - 46.6 % Final    Physical Exam:  General: alert, cooperative and no distress Lochia: appropriate Uterine Fundus: firm Incision: none DVT Evaluation: No evidence of DVT seen on physical exam. No cords or calf tenderness. No significant calf/ankle edema.  Discharge Diagnoses: Term Pregnancy-delivered  Discharge Information: Date: 03/26/2016 Activity: pelvic rest Diet: routine Medications: PNV, Ibuprofen, Colace, Iron and Percocet Condition: stable Instructions: refer to practice specific booklet Discharge to: home Follow-up Information    [facility] Schedule an appointment as soon as possible for a visit in 2 weeks.   Contact information:   [address] [place] [phone]      Newborn Data: Live born female  Birth Weight: 6 lb 3.7 oz (2825 g) APGAR: 9, 9  Home with mother.  Roe Coombsachelle A Braxston Quinter, CNM 03/26/2016, 8:14 AM

## 2016-03-26 NOTE — Progress Notes (Signed)
Post Partum Day #2 Subjective: no complaints, up ad lib, voiding and tolerating PO  Objective: Blood pressure 120/80, pulse 77, temperature 98.5 F (36.9 C), temperature source Oral, resp. rate 18, height 5\' 1"  (1.549 m), weight 137 lb (62.143 kg), last menstrual period 07/01/2015, SpO2 100 %, unknown if currently breastfeeding.  Physical Exam:  General: alert, cooperative and no distress Lochia: appropriate Uterine Fundus: firm Incision: none DVT Evaluation: No evidence of DVT seen on physical exam. No cords or calf tenderness. No significant calf/ankle edema.   Recent Labs  03/23/16 2015 03/25/16 0523  HGB 10.8* 8.7*  HCT 31.7* 25.6*    Assessment/Plan: Discharge home and Contraception Depo injection before d/c home  Anemia: Iron started   LOS: 3 days , CNM  Krishav Mamone A Catharina Pica 03/26/2016, 8:11 AM

## 2016-03-27 LAB — TYPE AND SCREEN
ABO/RH(D): O POS
ANTIBODY SCREEN: POSITIVE
DAT, IgG: NEGATIVE
Donor AG Type: NEGATIVE
Donor AG Type: NEGATIVE
PT AG Type: NEGATIVE
UNIT DIVISION: 0
UNIT DIVISION: 0

## 2016-04-11 ENCOUNTER — Telehealth: Payer: Self-pay | Admitting: *Deleted

## 2016-04-11 NOTE — Telephone Encounter (Signed)
Patient is requesting a refill of her pain medication. 6:20 Call to patient- she states her pain is intermittent and located in lower abdomen.. Mostly when she moves. She is still having some constipation although her bowels moved today. Encouraged- regular use of Colace, increased fluids and movement. Patient is breastfeeding and she states the uterine contractions with that is much better. Told patient to continue her Ibuprofen and her Tylenol. We do not normally refill narcotic medications after a vaginal birth with no complications. I will forward her call to her provider.

## 2016-04-17 ENCOUNTER — Ambulatory Visit: Payer: Medicaid Other | Admitting: Certified Nurse Midwife

## 2017-09-10 ENCOUNTER — Encounter (HOSPITAL_COMMUNITY): Payer: Self-pay | Admitting: *Deleted

## 2017-09-10 ENCOUNTER — Inpatient Hospital Stay (HOSPITAL_COMMUNITY)
Admission: AD | Admit: 2017-09-10 | Discharge: 2017-09-10 | Disposition: A | Discharge status: Home / Self Care | Payer: Medicaid Other | Source: Ambulatory Visit | Attending: Obstetrics and Gynecology | Admitting: Obstetrics and Gynecology

## 2017-09-10 DIAGNOSIS — Z79899 Other long term (current) drug therapy: Secondary | ICD-10-CM | POA: Diagnosis not present

## 2017-09-10 DIAGNOSIS — Z3A12 12 weeks gestation of pregnancy: Secondary | ICD-10-CM | POA: Diagnosis not present

## 2017-09-10 DIAGNOSIS — R509 Fever, unspecified: Secondary | ICD-10-CM | POA: Diagnosis not present

## 2017-09-10 DIAGNOSIS — Z818 Family history of other mental and behavioral disorders: Secondary | ICD-10-CM | POA: Insufficient documentation

## 2017-09-10 DIAGNOSIS — O219 Vomiting of pregnancy, unspecified: Secondary | ICD-10-CM | POA: Diagnosis not present

## 2017-09-10 DIAGNOSIS — Z9889 Other specified postprocedural states: Secondary | ICD-10-CM | POA: Diagnosis not present

## 2017-09-10 DIAGNOSIS — O26891 Other specified pregnancy related conditions, first trimester: Secondary | ICD-10-CM | POA: Diagnosis not present

## 2017-09-10 LAB — URINALYSIS, ROUTINE W REFLEX MICROSCOPIC
BILIRUBIN URINE: NEGATIVE
Glucose, UA: NEGATIVE mg/dL
HGB URINE DIPSTICK: NEGATIVE
Ketones, ur: 80 mg/dL — AB
Leukocytes, UA: NEGATIVE
NITRITE: NEGATIVE
PH: 5 (ref 5.0–8.0)
Protein, ur: 100 mg/dL — AB
SPECIFIC GRAVITY, URINE: 1.029 (ref 1.005–1.030)

## 2017-09-10 LAB — POCT PREGNANCY, URINE: PREG TEST UR: POSITIVE — AB

## 2017-09-10 MED ORDER — DOXYLAMINE SUCCINATE (SLEEP) 25 MG PO TABS: 25.0000 mg | ORAL_TABLET | Freq: Every evening | ORAL | 0 refills | Status: DC | PRN

## 2017-09-10 MED ORDER — ONDANSETRON HCL 4 MG PO TABS
8.0000 mg | ORAL_TABLET | Freq: Once | ORAL | Status: DC
  Filled 2017-09-10: qty 2

## 2017-09-10 MED ORDER — VITAMIN B-6 25 MG PO TABS: 25.0000 mg | ORAL_TABLET | Freq: Every day | ORAL | 1 refills | Status: DC

## 2017-09-10 MED ORDER — SODIUM CHLORIDE 0.9 % IV SOLN
25.0000 mg | Freq: Once | INTRAVENOUS | Status: AC
  Administered 2017-09-10: 25 mg via INTRAVENOUS
  Filled 2017-09-10: qty 1

## 2017-09-10 NOTE — Discharge Instructions (Signed)

## 2017-09-10 NOTE — MAU Note (Signed)
Pt reports vomiting non-stop and running a fever for the last 2 days.

## 2017-09-10 NOTE — MAU Provider Note (Signed)
  History     CSN: [id number]  Arrival date and time: 09/10/17 1300   None     Chief Complaint  Patient presents with  . Fever  . Emesis   21 yo G3P1102 at 8441w1d here for nausea and vomiting x 3 days. Reports she had a fever at home a couple days ago, has not taken any medication for this. Says she had similar nausea and vomiting with previous pregnancies. Vomited several times, "too many to count."    OB History    Gravida Para Term Preterm AB Living   3 2 1 1  0 2   SAB TAB Ectopic Multiple Live Births   0 0 0 0 2      Past Medical History:  Diagnosis Date  . No pertinent past medical history   . Preterm labor     Past Surgical History:  Procedure Laterality Date  . FRACTURE SURGERY      Family History  Problem Relation Age of Onset  . Bipolar disorder Father   . Depression Father   . Anxiety disorder Father     Social History   Tobacco Use  . Smoking status: Never Smoker  . Smokeless tobacco: Never Used  Substance Use Topics  . Alcohol use: No  . Drug use: No    Comment: Occassionally LAST SMOKED-  FEB    Allergies: No Known Allergies  Medications Prior to Admission  Medication Sig Dispense Refill Last Dose  . Prenatal Vit-Fe Phos-FA-Omega (VITAFOL GUMMIES) 3.33-0.333-34.8 MG CHEW Chew 3 each by mouth daily.   Past Week at Unknown time    Review of Systems  Constitutional: Positive for fever. Negative for fatigue.  HENT: Negative for congestion and ear discharge.   Eyes: Negative for discharge and itching.  Respiratory: Negative for apnea and chest tightness.   Cardiovascular: Negative for chest pain and leg swelling.  Gastrointestinal: Positive for nausea and vomiting. Negative for abdominal pain.  Endocrine: Negative for cold intolerance and heat intolerance.  Genitourinary: Negative for dysuria and vaginal bleeding.  Musculoskeletal: Negative for arthralgias and back pain.  Neurological: Negative for dizziness and headaches.   Hematological: Negative for adenopathy. Does not bruise/bleed easily.  Psychiatric/Behavioral: Negative for agitation and confusion.   Physical Exam   Blood pressure 114/72, pulse 80, temperature 98.4 F (36.9 C), temperature source Oral, resp. rate 16, height 5\' 3"  (1.6 m), weight 120 lb (54.4 kg), last menstrual period 06/17/2017, SpO2 100 %, unknown if currently breastfeeding.  Physical Exam  Constitutional: She is oriented to person, place, and time. She appears well-developed and well-nourished.  HENT:  Head: Normocephalic and atraumatic.  Eyes: Conjunctivae are normal. Pupils are equal, round, and reactive to light.  Neck: Normal range of motion. Neck supple.  Cardiovascular: Normal rate and intact distal pulses.  Respiratory: Effort normal. No respiratory distress.  GI: Soft. There is no tenderness.  Musculoskeletal: Normal range of motion. She exhibits no edema.  Neurological: She is alert and oriented to person, place, and time.  Skin: Skin is warm and dry.  Psychiatric: She has a normal mood and affect. Her behavior is normal.    MAU Course  Procedures  MDM IVF bolus and anti-emetics. Afebrile on presentation so not concerned for flu.  Assessment and Plan  1. Nausea and vomiting in pregnancy- improved with IVF bolus and anti-emetics. Follow up outpatient. 2. Pregnancy at 12 weeks completed gestation- follow up outpatient for routine care.   Bastian Andreoli 09/10/2017, 2:26 PM

## 2017-10-21 NOTE — L&D Delivery Note (Signed)
Delivery Note At 7:47 PM a viable female was delivered via Vaginal, Spontaneous (Presentation:LOT ;LOA  ).  APGAR: 9, 9; weight pending  .   Placenta status:intact, sent to L&D , .  Cord: three vessels  with the following complications:body cord, easily repositioned .  Cord pH: N/A  Anesthesia:  None, IV analgesia in labor Episiotomy: None Lacerations: None Suture Repair: N/A Est. Blood Loss (mL): 150  Mom to postpartum.  Baby to Couplet care / Skin to Skin.  Calvert Cantor, CNM 03/17/2018, 7:57 PM

## 2017-11-17 ENCOUNTER — Inpatient Hospital Stay (HOSPITAL_COMMUNITY)
Admission: AD | Admit: 2017-11-17 | Discharge: 2017-11-17 | Disposition: A | Discharge status: Home / Self Care | Payer: Medicaid Other | Source: Ambulatory Visit | Attending: Obstetrics and Gynecology | Admitting: Obstetrics and Gynecology

## 2017-11-17 ENCOUNTER — Encounter (HOSPITAL_COMMUNITY): Payer: Self-pay | Admitting: *Deleted

## 2017-11-17 DIAGNOSIS — O0932 Supervision of pregnancy with insufficient antenatal care, second trimester: Secondary | ICD-10-CM | POA: Diagnosis not present

## 2017-11-17 DIAGNOSIS — O210 Mild hyperemesis gravidarum: Secondary | ICD-10-CM | POA: Insufficient documentation

## 2017-11-17 DIAGNOSIS — O219 Vomiting of pregnancy, unspecified: Secondary | ICD-10-CM

## 2017-11-17 DIAGNOSIS — Z3A21 21 weeks gestation of pregnancy: Secondary | ICD-10-CM | POA: Insufficient documentation

## 2017-11-17 LAB — CBC
HEMATOCRIT: 31 % — AB (ref 36.0–46.0)
Hemoglobin: 10.6 g/dL — ABNORMAL LOW (ref 12.0–15.0)
MCH: 30.3 pg (ref 26.0–34.0)
MCHC: 34.2 g/dL (ref 30.0–36.0)
MCV: 88.6 fL (ref 78.0–100.0)
Platelets: 367 10*3/uL (ref 150–400)
RBC: 3.5 MIL/uL — AB (ref 3.87–5.11)
RDW: 13.2 % (ref 11.5–15.5)
WBC: 11.5 10*3/uL — AB (ref 4.0–10.5)

## 2017-11-17 LAB — COMPREHENSIVE METABOLIC PANEL
ALT: 10 U/L — ABNORMAL LOW (ref 14–54)
AST: 12 U/L — AB (ref 15–41)
Albumin: 3.1 g/dL — ABNORMAL LOW (ref 3.5–5.0)
Alkaline Phosphatase: 75 U/L (ref 38–126)
Anion gap: 6 (ref 5–15)
BUN: 7 mg/dL (ref 6–20)
CHLORIDE: 107 mmol/L (ref 101–111)
CO2: 21 mmol/L — ABNORMAL LOW (ref 22–32)
Calcium: 9.3 mg/dL (ref 8.9–10.3)
Creatinine, Ser: 0.4 mg/dL — ABNORMAL LOW (ref 0.44–1.00)
Glucose, Bld: 77 mg/dL (ref 65–99)
POTASSIUM: 3.9 mmol/L (ref 3.5–5.1)
Sodium: 134 mmol/L — ABNORMAL LOW (ref 135–145)
Total Bilirubin: 0.8 mg/dL (ref 0.3–1.2)
Total Protein: 6.6 g/dL (ref 6.5–8.1)

## 2017-11-17 LAB — URINALYSIS, ROUTINE W REFLEX MICROSCOPIC
Bilirubin Urine: NEGATIVE
Glucose, UA: NEGATIVE mg/dL
Hgb urine dipstick: NEGATIVE
KETONES UR: 80 mg/dL — AB
Nitrite: NEGATIVE
PH: 5 (ref 5.0–8.0)
Protein, ur: 30 mg/dL — AB
SPECIFIC GRAVITY, URINE: 1.027 (ref 1.005–1.030)

## 2017-11-17 LAB — LIPASE, BLOOD: LIPASE: 25 U/L (ref 11–51)

## 2017-11-17 MED ORDER — DOXYLAMINE SUCCINATE (SLEEP) 25 MG PO TABS: 25.0000 mg | ORAL_TABLET | Freq: Every day | ORAL | 1 refills | Status: DC

## 2017-11-17 MED ORDER — VITAMIN B-6 25 MG PO TABS: 25.0000 mg | ORAL_TABLET | Freq: Three times a day (TID) | ORAL | 1 refills | Status: DC

## 2017-11-17 MED ORDER — LACTATED RINGERS IV BOLUS (SEPSIS)
1000.0000 mL | Freq: Once | INTRAVENOUS | Status: AC
  Administered 2017-11-17: 1000 mL via INTRAVENOUS

## 2017-11-17 MED ORDER — PROMETHAZINE HCL 25 MG PO TABS: 25.0000 mg | ORAL_TABLET | Freq: Four times a day (QID) | ORAL | 0 refills | Status: DC | PRN

## 2017-11-17 MED ORDER — PROMETHAZINE HCL 25 MG/ML IJ SOLN
25.0000 mg | Freq: Once | INTRAMUSCULAR | Status: AC
  Administered 2017-11-17: 25 mg via INTRAVENOUS
  Filled 2017-11-17: qty 1

## 2017-11-17 NOTE — MAU Note (Signed)
Pt states she has not been able to keep anything down for the last 2 weeks. LMP in August , has not started care anywhere.

## 2017-11-17 NOTE — MAU Provider Note (Signed)
History  CSN: [id number] Arrival date and time: 11/17/17 0751  First Provider Initiated Contact with Patient 11/17/17 [phone]      Chief Complaint  Patient presents with  . Emesis    HPI: Sandra Matthews is a 22 y.o. [phone] with IUP at [redacted]w[redacted]d who presents to maternity admissions reporting nausea/vomiting. She reports she had a lot of nausea and vomiting in the first trimester, which initially improved, but about 2 weeks ago, started having nausea, vomiting and poor appetite again. She states she thinks this is due to stress, but does not want to elaborate. When asked, she does state she is safe at home. She reports not having much an appetite, and she is only eating about one meal a day. Reports poor fluid intake as well, as this makes her nauseous.   Denies contractions, leakage of fluid or vaginal bleeding. Also denies any abnormal vaginal discharge, vaginal bleeding, fevers, chills, malaise, dysuria, hematuria, urinary frequency, diarrhea, RUQ/epigastric pain, dizziness/lighreadhess, or headache.   She has not yet established prenatal care. States she is still applying for Medicaid   OB History  Gravida Para Term Preterm AB Living  3 2 1 1  0 2  SAB TAB Ectopic Multiple Live Births  0 0 0 0 2    # Outcome Date GA Lbr Len/2nd Weight Sex Delivery Anes PTL Lv  3 Current           2 Term 03/24/16 [redacted]w[redacted]d 18:33 / 00:04 6 lb 3.7 oz (2.825 kg) F Vag-Spont None  LIV  1 Preterm 03/09/11 [redacted]w[redacted]d  4 lb 2 oz (1.871 kg) M Vag-Spont None Y LIV     Past Medical History:  Diagnosis Date  . No pertinent past medical history   . Preterm labor    Past Surgical History:  Procedure Laterality Date  . FRACTURE SURGERY     Family History  Problem Relation Age of Onset  . Bipolar disorder Father   . Depression Father   . Anxiety disorder Father    Social History   Socioeconomic History  . Marital status: Single    Spouse name: Not on file  . Number of children: Not on file  . Years of  education: Not on file  . Highest education level: Not on file  Social Needs  . Financial resource strain: Not on file  . Food insecurity - worry: Not on file  . Food insecurity - inability: Not on file  . Transportation needs - medical: Not on file  . Transportation needs - non-medical: Not on file  Occupational History  . Not on file  Tobacco Use  . Smoking status: Never Smoker  . Smokeless tobacco: Never Used  Substance and Sexual Activity  . Alcohol use: No  . Drug use: No    Comment: Occassionally LAST SMOKED-  FEB  . Sexual activity: Yes    Birth control/protection: None  Other Topics Concern  . Not on file  Social History Narrative  . Not on file   No Known Allergies  Medications Prior to Admission  Medication Sig Dispense Refill Last Dose  . Prenatal Vit-Fe Phos-FA-Omega (VITAFOL GUMMIES) 3.33-0.333-34.8 MG CHEW Chew 3 each by mouth daily.   Past Month at Unknown time  . doxylamine, Sleep, (UNISOM) 25 MG tablet Take 1 tablet (25 mg total) by mouth at bedtime as needed. 30 tablet 0 11/15/2017  . vitamin B-6 (PYRIDOXINE) 25 MG tablet Take 1 tablet (25 mg total) by mouth daily. 30 tablet 1 11/15/2017  I have reviewed patient's Past Medical Hx, Surgical Hx, Family Hx, Social Hx, medications and allergies.   Review of Systems: Negative except for what is mentioned in HPI.  Physical Exam   Blood pressure 115/77, pulse 95, temperature 98.4 F (36.9 C), temperature source Oral, resp. rate 15, height 5\' 4"  (1.626 m), weight 124 lb (56.2 kg), last menstrual period 06/17/2017, SpO2 99 %, unknown if currently breastfeeding.  Constitutional: Well-developed, well-nourished female in no acute distress.  HENT: [place]/AT, normal oropharynx mucosa. MMM Eyes: normal conjunctivae, no scleral icterus Cardiovascular: normal rate, regular rhythm Respiratory: normal effort, lungs CTAB.  GI: Abd soft, non-tender, gravid appropriate for gestational age with fundus just above umbilicus  GU:  Neg CVAT bilaterally Pelvic: deferred MSK: Extremities nontender, no edema Neurologic: Alert and oriented x 4. Psych: Patient tearful at times Skin: warm and dry   FHT:  154  MAU Course/MDM:   Nursing notes and VS reviewed. Patient seen and examined, as noted above.   IV fluids and IV phenergan given.  Labs ordered.  Results reviewed:  Results for orders placed or performed during the hospital encounter of 11/17/17  Urinalysis, Routine w reflex microscopic  Result Value Ref Range   Color, Urine AMBER (A) YELLOW   APPearance CLOUDY (A) CLEAR   Specific Gravity, Urine 1.027 1.005 - 1.030   pH 5.0 5.0 - 8.0   Glucose, UA NEGATIVE NEGATIVE mg/dL   Hgb urine dipstick NEGATIVE NEGATIVE   Bilirubin Urine NEGATIVE NEGATIVE   Ketones, ur 80 (A) NEGATIVE mg/dL   Protein, ur 30 (A) NEGATIVE mg/dL   Nitrite NEGATIVE NEGATIVE   Leukocytes, UA TRACE (A) NEGATIVE   RBC / HPF 0-5 0 - 5 RBC/hpf   WBC, UA 6-30 0 - 5 WBC/hpf   Bacteria, UA RARE (A) NONE SEEN   Squamous Epithelial / LPF TOO NUMEROUS TO COUNT (A) NONE SEEN   Mucus PRESENT   CBC  Result Value Ref Range   WBC 11.5 (H) 4.0 - 10.5 K/uL   RBC 3.50 (L) 3.87 - 5.11 MIL/uL   Hemoglobin 10.6 (L) 12.0 - 15.0 g/dL   HCT 16.131.0 (L) 09.636.0 - 04.546.0 %   MCV 88.6 78.0 - 100.0 fL   MCH 30.3 26.0 - 34.0 pg   MCHC 34.2 30.0 - 36.0 g/dL   RDW 40.913.2 81.111.5 - 91.415.5 %   Platelets 367 150 - 400 K/uL   Patient's symptoms improved.  Assessment and Plan  Assessment: 1. Nausea/vomiting in pregnancy   2. No prenatal care in current pregnancy in second trimester   3. [redacted] weeks gestation of pregnancy     Plan: --Discharge home in stable condition.  --Start Vitamin B6. Rx for phenergan prn --Outpatient anatomy US ordered, and [facility]   Degele, Kandra NicolasJulie P, MD 11/17/2017 8:25 AM

## 2017-11-17 NOTE — Discharge Instructions (Signed)

## 2017-11-17 NOTE — MAU Note (Signed)
Pt tearful/crying during triage interview. When asked if she was crying due to the nausea or if she was having pain she replied that she just had a lot of stuff going on right now. Did not want to discuss those things but rn offered to have a Child psychotherapistsocial worker some in if needed. Pt was appreciative and states she will let me know.

## 2017-11-19 ENCOUNTER — Telehealth: Payer: Self-pay | Admitting: General Practice

## 2017-11-19 NOTE — Telephone Encounter (Signed)
Called and notified patient of New OB appointment.  Patient voiced understanding. 

## 2017-12-01 ENCOUNTER — Encounter: Payer: Self-pay | Admitting: Advanced Practice Midwife

## 2017-12-10 ENCOUNTER — Other Ambulatory Visit: Payer: Self-pay

## 2017-12-10 ENCOUNTER — Encounter (HOSPITAL_COMMUNITY): Payer: Self-pay | Admitting: *Deleted

## 2017-12-10 ENCOUNTER — Inpatient Hospital Stay (HOSPITAL_COMMUNITY)
Admission: AD | Admit: 2017-12-10 | Discharge: 2017-12-10 | Disposition: A | Discharge status: Home / Self Care | Payer: Medicaid Other | Source: Ambulatory Visit | Attending: Obstetrics & Gynecology | Admitting: Obstetrics & Gynecology

## 2017-12-10 DIAGNOSIS — O26892 Other specified pregnancy related conditions, second trimester: Secondary | ICD-10-CM | POA: Diagnosis not present

## 2017-12-10 DIAGNOSIS — R109 Unspecified abdominal pain: Secondary | ICD-10-CM | POA: Diagnosis not present

## 2017-12-10 DIAGNOSIS — A084 Viral intestinal infection, unspecified: Secondary | ICD-10-CM | POA: Diagnosis not present

## 2017-12-10 DIAGNOSIS — Z3A25 25 weeks gestation of pregnancy: Secondary | ICD-10-CM | POA: Insufficient documentation

## 2017-12-10 DIAGNOSIS — Z818 Family history of other mental and behavioral disorders: Secondary | ICD-10-CM | POA: Insufficient documentation

## 2017-12-10 DIAGNOSIS — R112 Nausea with vomiting, unspecified: Secondary | ICD-10-CM | POA: Diagnosis present

## 2017-12-10 DIAGNOSIS — O99612 Diseases of the digestive system complicating pregnancy, second trimester: Secondary | ICD-10-CM | POA: Insufficient documentation

## 2017-12-10 LAB — COMPREHENSIVE METABOLIC PANEL
ALBUMIN: 3.3 g/dL — AB (ref 3.5–5.0)
ALT: 11 U/L — ABNORMAL LOW (ref 14–54)
ANION GAP: 9 (ref 5–15)
AST: 14 U/L — ABNORMAL LOW (ref 15–41)
Alkaline Phosphatase: 93 U/L (ref 38–126)
BILIRUBIN TOTAL: 0.8 mg/dL (ref 0.3–1.2)
BUN: 5 mg/dL — ABNORMAL LOW (ref 6–20)
CHLORIDE: 102 mmol/L (ref 101–111)
CO2: 18 mmol/L — AB (ref 22–32)
Calcium: 8.8 mg/dL — ABNORMAL LOW (ref 8.9–10.3)
Creatinine, Ser: 0.46 mg/dL (ref 0.44–1.00)
GFR calc Af Amer: >60 mL/min (ref >60)
GFR calc non Af Amer: >60 mL/min (ref >60)
GLUCOSE: 92 mg/dL (ref 65–99)
POTASSIUM: 3 mmol/L — AB (ref 3.5–5.1)
SODIUM: 129 mmol/L — AB (ref 135–145)
TOTAL PROTEIN: 7.4 g/dL (ref 6.5–8.1)

## 2017-12-10 LAB — URINALYSIS, ROUTINE W REFLEX MICROSCOPIC
BILIRUBIN URINE: NEGATIVE
GLUCOSE, UA: 50 mg/dL — AB
Hgb urine dipstick: NEGATIVE
Ketones, ur: 80 mg/dL — AB
Nitrite: NEGATIVE
PH: 5 (ref 5.0–8.0)
PROTEIN: 100 mg/dL — AB
Specific Gravity, Urine: 1.031 — ABNORMAL HIGH (ref 1.005–1.030)

## 2017-12-10 LAB — CBC WITH DIFFERENTIAL/PLATELET
Basophils Absolute: 0 10*3/uL (ref 0.0–0.1)
Basophils Relative: 0 %
EOS ABS: 0 10*3/uL (ref 0.0–0.7)
EOS PCT: 0 %
HCT: 31.9 % — ABNORMAL LOW (ref 36.0–46.0)
Hemoglobin: 11 g/dL — ABNORMAL LOW (ref 12.0–15.0)
Lymphocytes Relative: 12 %
Lymphs Abs: 2.2 10*3/uL (ref 0.7–4.0)
MCH: 30.1 pg (ref 26.0–34.0)
MCHC: 34.5 g/dL (ref 30.0–36.0)
MCV: 87.4 fL (ref 78.0–100.0)
MONO ABS: 0.5 10*3/uL (ref 0.1–1.0)
MONOS PCT: 3 %
Neutro Abs: 15.3 10*3/uL — ABNORMAL HIGH (ref 1.7–7.7)
Neutrophils Relative %: 85 %
PLATELETS: 386 10*3/uL (ref 150–400)
RBC: 3.65 MIL/uL — ABNORMAL LOW (ref 3.87–5.11)
RDW: 12.9 % (ref 11.5–15.5)
WBC: 18.1 10*3/uL — ABNORMAL HIGH (ref 4.0–10.5)

## 2017-12-10 MED ORDER — ONDANSETRON HCL 4 MG/2ML IJ SOLN
4.0000 mg | Freq: Once | INTRAMUSCULAR | Status: AC
  Administered 2017-12-10: 4 mg via INTRAVENOUS
  Filled 2017-12-10: qty 2

## 2017-12-10 MED ORDER — ONDANSETRON HCL 4 MG PO TABS: 4.0000 mg | ORAL_TABLET | Freq: Four times a day (QID) | ORAL | 0 refills | Status: DC

## 2017-12-10 MED ORDER — SODIUM CHLORIDE 0.9 % IV SOLN
Freq: Once | INTRAVENOUS | Status: AC
  Administered 2017-12-10: 10:00:00 via INTRAVENOUS
  Filled 2017-12-10: qty 1000

## 2017-12-10 MED ORDER — LACTATED RINGERS IV BOLUS (SEPSIS)
1000.0000 mL | Freq: Once | INTRAVENOUS | Status: AC
  Administered 2017-12-10: 1000 mL via INTRAVENOUS

## 2017-12-10 NOTE — MAU Provider Note (Signed)
History     CSN: [id number]  Arrival date and time: 12/10/17 [id number]   First Provider Initiated Contact with Patient 12/10/17 0809      Chief Complaint  Patient presents with  . Decreased Fetal Movement  . Abdominal Pain  . Emesis  . Diarrhea   HPI  Ms. Sandra Matthews is a 22 y.o. [phone] at 5139w1d who presents to MAU today with complaint of N/V/D x 2 days. The patient states daughter has similar symptoms. She has had intermittent lower abdominal cramping. She denies contractions, vaginal bleeding, LOF or fever. She has not taken anything at home for her symptoms.   OB History    Gravida Para Term Preterm AB Living   3 2 1 1  0 2   SAB TAB Ectopic Multiple Live Births   0 0 0 0 2      Past Medical History:  Diagnosis Date  . No pertinent past medical history   . Preterm labor     Past Surgical History:  Procedure Laterality Date  . FRACTURE SURGERY     broken arm in childhood, cast only, no surgery    Family History  Problem Relation Age of Onset  . Bipolar disorder Father   . Depression Father   . Anxiety disorder Father     Social History   Tobacco Use  . Smoking status: Never Smoker  . Smokeless tobacco: Never Used  Substance Use Topics  . Alcohol use: No  . Drug use: Yes    Types: Marijuana    Comment: was doing it to increase appetite, but stopped one month ago    Allergies: No Known Allergies  Medications Prior to Admission  Medication Sig Dispense Refill Last Dose  . doxylamine, Sleep, (UNISOM) 25 MG tablet Take 1 tablet (25 mg total) by mouth at bedtime. 30 tablet 1   . Prenatal Vit-Fe Phos-FA-Omega (VITAFOL GUMMIES) 3.33-0.333-34.8 MG CHEW Chew 3 each by mouth daily.   Past Month at Unknown time  . promethazine (PHENERGAN) 25 MG tablet Take 1 tablet (25 mg total) by mouth every 6 (six) hours as needed for nausea or vomiting. 30 tablet 0   . vitamin B-6 (PYRIDOXINE) 25 MG tablet Take 1 tablet (25 mg total) by mouth 3 (three) times daily. 90  tablet 1     Review of Systems  Constitutional: Negative for fever.  Gastrointestinal: Positive for abdominal pain, diarrhea, nausea and vomiting. Negative for constipation.  Genitourinary: Negative for vaginal bleeding and vaginal discharge.   Physical Exam   Blood pressure 112/60, pulse (!) 101, temperature 98.7 F (37.1 C), temperature source Oral, resp. rate 16, height 5\' 4"  (1.626 m), weight 121 lb 12 oz (55.2 kg), last menstrual period 06/17/2017, SpO2 100 %, unknown if currently breastfeeding.  Physical Exam  Nursing note and vitals reviewed. Constitutional: She is oriented to person, place, and time. She appears well-developed and well-nourished. No distress.  HENT:  Head: Normocephalic and atraumatic.  Cardiovascular: Normal rate, regular rhythm and normal heart sounds.  Respiratory: Effort normal and breath sounds normal. No respiratory distress.  GI: Soft. She exhibits no distension and no mass. Bowel sounds are decreased. There is no tenderness. There is no rebound and no guarding.  Neurological: She is alert and oriented to person, place, and time.  Skin: Skin is warm and dry. No erythema.  Psychiatric: She has a normal mood and affect.   Results for orders placed or performed during the hospital encounter of 12/10/17 (from the past  24 hour(s))  Urinalysis, Routine w reflex microscopic     Status: Abnormal   Collection Time: 12/10/17  7:47 AM  Result Value Ref Range   Color, Urine YELLOW YELLOW   APPearance CLOUDY (A) CLEAR   Specific Gravity, Urine 1.031 (H) 1.005 - 1.030   pH 5.0 5.0 - 8.0   Glucose, UA 50 (A) NEGATIVE mg/dL   Hgb urine dipstick NEGATIVE NEGATIVE   Bilirubin Urine NEGATIVE NEGATIVE   Ketones, ur 80 (A) NEGATIVE mg/dL   Protein, ur 161 (A) NEGATIVE mg/dL   Nitrite NEGATIVE NEGATIVE   Leukocytes, UA TRACE (A) NEGATIVE   RBC / HPF 0-5 0 - 5 RBC/hpf   WBC, UA 6-30 0 - 5 WBC/hpf   Bacteria, UA RARE (A) NONE SEEN   Squamous Epithelial / LPF TOO  NUMEROUS TO COUNT (A) NONE SEEN   Mucus PRESENT   CBC with Differential/Platelet     Status: Abnormal   Collection Time: 12/10/17  8:17 AM  Result Value Ref Range   WBC 18.1 (H) 4.0 - 10.5 K/uL   RBC 3.65 (L) 3.87 - 5.11 MIL/uL   Hemoglobin 11.0 (L) 12.0 - 15.0 g/dL   HCT 09.6 (L) 04.5 - 40.9 %   MCV 87.4 78.0 - 100.0 fL   MCH 30.1 26.0 - 34.0 pg   MCHC 34.5 30.0 - 36.0 g/dL   RDW 81.1 91.4 - 78.2 %   Platelets 386 150 - 400 K/uL   Neutrophils Relative % 85 %   Neutro Abs 15.3 (H) 1.7 - 7.7 K/uL   Lymphocytes Relative 12 %   Lymphs Abs 2.2 0.7 - 4.0 K/uL   Monocytes Relative 3 %   Monocytes Absolute 0.5 0.1 - 1.0 K/uL   Eosinophils Relative 0 %   Eosinophils Absolute 0.0 0.0 - 0.7 K/uL   Basophils Relative 0 %   Basophils Absolute 0.0 0.0 - 0.1 K/uL  Comprehensive metabolic panel     Status: Abnormal   Collection Time: 12/10/17  8:17 AM  Result Value Ref Range   Sodium 129 (L) 135 - 145 mmol/L   Potassium 3.0 (L) 3.5 - 5.1 mmol/L   Chloride 102 101 - 111 mmol/L   CO2 18 (L) 22 - 32 mmol/L   Glucose, Bld 92 65 - 99 mg/dL   BUN 5 (L) 6 - 20 mg/dL   Creatinine, Ser 9.56 0.44 - 1.00 mg/dL   Calcium 8.8 (L) 8.9 - 10.3 mg/dL   Total Protein 7.4 6.5 - 8.1 g/dL   Albumin 3.3 (L) 3.5 - 5.0 g/dL   AST 14 (L) 15 - 41 U/L   ALT 11 (L) 14 - 54 U/L   Alkaline Phosphatase 93 38 - 126 U/L   Total Bilirubin 0.8 0.3 - 1.2 mg/dL   GFR calc non Af Amer >60 >60 mL/min   GFR calc Af Amer >60 >60 mL/min   Anion gap 9 5 - 15    Fetal Monitoring: Baseline: 140 bpm Variability: moderate Accelerations: 10 x 10 Decelerations: none Contractions: none   MAU Course  Procedures None  MDM UA today  IV LR with 4 mg IV Zofran CBC, CMP ordered UA shows moderate dehydration. CMP shows hypokalemia and hyponatremia. Will replace with IV NS and MV and 10 mEq K+ Patient reports improvement in symptoms. Able to tolerate PO liquids prior to discharge.  Assessment and Plan  A: SIUP at  [redacted]w[redacted]d Viral gastroenteritis   P: Discharge home Rx for Zofran given to patient  Increased  PO hydration advised and advance diet as tolerated BRAT diet included on AVS Preterm labor precautions discussed Patient advised to follow-up with [facility] or [facility] to start prenatal care as soon as possible Patient may return to MAU as needed or if her condition were to change or worsen  Vonzella Nipple, PA-C 12/10/2017, 9:10 AM

## 2017-12-10 NOTE — MAU Note (Signed)
Vomiting and diarrhea started 2 days ago.  Cramping in lower abd.  Denies fever.  States daughter has had this also, had to take her to the hosp yesterday. Reporting decrease in fetal movement.

## 2017-12-10 NOTE — MAU Note (Signed)
Began having vomiting and diarrhea 2-3 days ago.  Has not been able to eat or drink much since it started.  Noticed a decrease in fetal movement "like yesterday or the day before".  Last episode of vomiting and diarrhea was at 6am today.  23yo daughter had the same GI symptoms first.

## 2017-12-10 NOTE — Discharge Instructions (Signed)
° °Food Choices to Help Relieve Diarrhea, Adult °When you have diarrhea, the foods you eat and your eating habits are very important. Choosing the right foods and drinks can help: °· Relieve diarrhea. °· Replace lost fluids and nutrients. °· Prevent dehydration. ° °What general guidelines should I follow? °Relieving diarrhea °· Choose foods with less than 2 g or .07 oz. of fiber per serving. °· Limit fats to less than 8 tsp (38 g or 1.34 oz.) a day. °· Avoid the following: °? Foods and beverages sweetened with high-fructose corn syrup, honey, or sugar alcohols such as xylitol, sorbitol, and mannitol. °? Foods that contain a lot of fat or sugar. °? Fried, greasy, or spicy foods. °? High-fiber grains, breads, and cereals. °? Raw fruits and vegetables. °· Eat foods that are rich in probiotics. These foods include dairy products such as yogurt and fermented milk products. They help increase healthy bacteria in the stomach and intestines (gastrointestinal tract, or GI tract). °· If you have lactose intolerance, avoid dairy products. These may make your diarrhea worse. °· Take medicine to help stop diarrhea (antidiarrheal medicine) only as told by your health care provider. °Replacing nutrients °· Eat small meals or snacks every 3-4 hours. °· Eat bland foods, such as white rice, toast, or baked potato, until your diarrhea starts to get better. Gradually reintroduce nutrient-rich foods as tolerated or as told by your health care provider. This includes: °? Well-cooked protein foods. °? Peeled, seeded, and soft-cooked fruits and vegetables. °? Low-fat dairy products. °· Take vitamin and mineral supplements as told by your health care provider. °Preventing dehydration ° °· Start by sipping water or a special solution to prevent dehydration (oral rehydration solution, ORS). Urine that is clear or pale yellow means that you are getting enough fluid. °· Try to drink at least 8-10 cups of fluid each day to help replace lost  fluids. °· You may add other liquids in addition to water, such as clear juice or decaffeinated sports drinks, as tolerated or as told by your health care provider. °· Avoid drinks with caffeine, such as coffee, tea, or soft drinks. °· Avoid alcohol. °What foods are recommended? °The items listed may not be a complete list. Talk with your health care provider about what dietary choices are best for you. °Grains °White rice. White, French, or pita breads (fresh or toasted), including plain rolls, buns, or bagels. White pasta. Saltine, soda, or graham crackers. Pretzels. Low-fiber cereal. Cooked cereals made with water (such as cornmeal, farina, or cream cereals). Plain muffins. Matzo. Melba toast. Zwieback. °Vegetables °Potatoes (without the skin). Most well-cooked and canned vegetables without skins or seeds. Tender lettuce. °Fruits °Apple sauce. Fruits canned in juice. Cooked apricots, cherries, grapefruit, peaches, pears, or plums. Fresh bananas and cantaloupe. °Meats and other protein foods °Baked or boiled chicken. Eggs. Tofu. Fish. Seafood. Smooth nut butters. Ground or well-cooked tender beef, ham, veal, lamb, pork, or poultry. °Dairy °Plain yogurt, kefir, and unsweetened liquid yogurt. Lactose-free milk, buttermilk, skim milk, or soy milk. Low-fat or nonfat hard cheese. °Beverages °Water. Low-calorie sports drinks. Fruit juices without pulp. Strained tomato and vegetable juices. Decaffeinated teas. Sugar-free beverages not sweetened with sugar alcohols. Oral rehydration solutions, if approved by your health care provider. °Seasoning and other foods °Bouillon, broth, or soups made from recommended foods. °What foods are not recommended? °The items listed may not be a complete list. Talk with your health care provider about what dietary choices are best for you. °Grains °Whole grain, whole   wheat, bran, or rye breads, rolls, pastas, and crackers. Wild or brown rice. Whole grain or bran cereals. Barley. Oats and  oatmeal. Corn tortillas or taco shells. Granola. Popcorn. °Vegetables °Raw vegetables. Fried vegetables. Cabbage, broccoli, Brussels sprouts, artichokes, baked beans, beet greens, corn, kale, legumes, peas, sweet potatoes, and yams. Potato skins. Cooked spinach and cabbage. °Fruits °Dried fruit, including raisins and dates. Raw fruits. Stewed or dried prunes. Canned fruits with syrup. °Meat and other protein foods °Fried or fatty meats. Deli meats. Chunky nut butters. Nuts and seeds. Beans and lentils. Bacon. Hot dogs. Sausage. °Dairy °High-fat cheeses. Whole milk, chocolate milk, and beverages made with milk, such as milk shakes. Half-and-half. Cream. sour cream. Ice cream. °Beverages °Caffeinated beverages (such as coffee, tea, soda, or energy drinks). Alcoholic beverages. Fruit juices with pulp. Prune juice. Soft drinks sweetened with high-fructose corn syrup or sugar alcohols. High-calorie sports drinks. °Fats and oils °Butter. Cream sauces. Margarine. Salad oils. Plain salad dressings. Olives. Avocados. Mayonnaise. °Sweets and desserts °Sweet rolls, doughnuts, and sweet breads. Sugar-free desserts sweetened with sugar alcohols such as xylitol and sorbitol. °Seasoning and other foods °Honey. Hot sauce. Chili powder. Gravy. Cream-based or milk-based soups. Pancakes and waffles. °Summary °· When you have diarrhea, the foods you eat and your eating habits are very important. °· Make sure you get at least 8-10 cups of fluid each day, or enough to keep your urine clear or pale yellow. °· Eat bland foods and gradually reintroduce healthy, nutrient-rich foods as tolerated, or as told by your health care provider. °· Avoid high-fiber, fried, greasy, or spicy foods. °This information is not intended to replace advice given to you by your health care provider. Make sure you discuss any questions you have with your health care provider. °Document Released: 12/28/2003 Document Revised: 10/04/2016 Document Reviewed:  10/04/2016 °Elsevier Interactive Patient Education © 2018 [employer] °Viral Gastroenteritis, Adult °Viral gastroenteritis is also known as the stomach flu. This condition is caused by certain germs (viruses). These germs can be passed from person to person very easily (are very contagious). This condition can cause sudden watery poop (diarrhea), fever, and throwing up (vomiting). °Having watery poop and throwing up can make you feel weak and cause you to get dehydrated. Dehydration can make you tired and thirsty, make you have a dry mouth, and make it so you pee (urinate) less often. Older adults and people with other diseases or a weak defense system (immune system) are at higher risk for dehydration. It is important to replace the fluids that you lose from having watery poop and throwing up. °Follow these instructions at home: °Follow instructions from your doctor about how to care for yourself at home. °Eating and drinking ° °Follow these instructions as told by your doctor: °· Take an oral rehydration solution (ORS). This is a drink that is sold at pharmacies and stores. °· Drink clear fluids in small amounts as you are able, such as: °? Water. °? Ice chips. °? Diluted fruit juice. °? Low-calorie sports drinks. °· Eat bland, easy-to-digest foods in small amounts as you are able, such as: °? Bananas. °? Applesauce. °? Rice. °? Low-fat (lean) meats. °? Toast. °? Crackers. °· Avoid fluids that have a lot of sugar or caffeine in them. °· Avoid alcohol. °· Avoid spicy or fatty foods. ° °General instructions °· Drink enough fluid to keep your pee (urine) clear or pale yellow. °· Wash your hands often. If you cannot use soap and water, use hand sanitizer. °· Make   sure that all people in your home wash their hands well and often. °· Rest at home while you get better. °· Take over-the-counter and prescription medicines only as told by your doctor. °· Watch your condition for any changes. °· Take a warm bath to help  with any burning or pain from having watery poop. °· Keep all follow-up visits as told by your doctor. This is important. °Contact a doctor if: °· You cannot keep fluids down. °· Your symptoms get worse. °· You have new symptoms. °· You feel light-headed or dizzy. °· You have muscle cramps. °Get help right away if: °· You have chest pain. °· You feel very weak or you pass out (faint). °· You see blood in your throw-up. °· Your throw-up looks like coffee grounds. °· You have bloody or black poop (stools) or poop that look like tar. °· You have a very bad headache, a stiff neck, or both. °· You have a rash. °· You have very bad pain, cramping, or bloating in your belly (abdomen). °· You have trouble breathing. °· You are breathing very quickly. °· Your heart is beating very quickly. °· Your skin feels cold and clammy. °· You feel confused. °· You have pain when you pee. °· You have signs of dehydration, such as: °? Dark pee, hardly any pee, or no pee. °? Cracked lips. °? Dry mouth. °? Sunken eyes. °? Sleepiness. °? Weakness. °This information is not intended to replace advice given to you by your health care provider. Make sure you discuss any questions you have with your health care provider. °Document Released: 03/25/2008 Document Revised: 04/26/2016 Document Reviewed: 06/13/2015 °Elsevier Interactive Patient Education © 2017 [employer]. ° °

## 2017-12-17 ENCOUNTER — Ambulatory Visit (INDEPENDENT_AMBULATORY_CARE_PROVIDER_SITE_OTHER): Payer: Medicaid Other | Admitting: Family Medicine

## 2017-12-17 ENCOUNTER — Encounter: Payer: Self-pay | Admitting: Family Medicine

## 2017-12-17 ENCOUNTER — Other Ambulatory Visit (HOSPITAL_COMMUNITY)
Admission: RE | Admit: 2017-12-17 | Discharge: 2017-12-17 | Disposition: A | Discharge status: Home / Self Care | Payer: Medicaid Other | Source: Ambulatory Visit | Attending: Advanced Practice Midwife | Admitting: Advanced Practice Midwife

## 2017-12-17 VITALS — Wt 129.9 lb

## 2017-12-17 DIAGNOSIS — O09212 Supervision of pregnancy with history of pre-term labor, second trimester: Secondary | ICD-10-CM

## 2017-12-17 DIAGNOSIS — O09892 Supervision of other high risk pregnancies, second trimester: Secondary | ICD-10-CM

## 2017-12-17 DIAGNOSIS — Z3400 Encounter for supervision of normal first pregnancy, unspecified trimester: Secondary | ICD-10-CM

## 2017-12-17 DIAGNOSIS — O0932 Supervision of pregnancy with insufficient antenatal care, second trimester: Secondary | ICD-10-CM

## 2017-12-17 DIAGNOSIS — O0930 Supervision of pregnancy with insufficient antenatal care, unspecified trimester: Secondary | ICD-10-CM

## 2017-12-17 DIAGNOSIS — O09219 Supervision of pregnancy with history of pre-term labor, unspecified trimester: Secondary | ICD-10-CM

## 2017-12-17 DIAGNOSIS — Z8759 Personal history of other complications of pregnancy, childbirth and the puerperium: Secondary | ICD-10-CM | POA: Insufficient documentation

## 2017-12-17 MED ORDER — PROGESTERONE MICRONIZED 100 MG PO CAPS: 100.0000 mg | ORAL_CAPSULE | Freq: Every day | ORAL | 3 refills | Status: DC

## 2017-12-17 NOTE — Patient Instructions (Signed)

## 2017-12-17 NOTE — Progress Notes (Signed)
  Subjective:    Sandra Matthews is a [id number] 1387w1d being seen today for her first obstetrical visit.  Her obstetrical history is significant for history of PPROM and preterm birth at 6726 weeks with first pregnancy, history of short cervix in 2nd pregnancy but delivered at term. Patient does intend to breast feed. Pregnancy history fully reviewed.  Patient reports no complaints.  Vitals:   12/17/17 1344  Weight: 58.9 kg (129 lb 14.4 oz)    HISTORY: OB History  Gravida Para Term Preterm AB Living  3 2 1 1  0 2  SAB TAB Ectopic Multiple Live Births  0 0 0 0 2    # Outcome Date GA Lbr Len/2nd Weight Sex Delivery Anes PTL Lv  3 Current           2 Term 03/24/16 1530w1d 18:33 / 00:04 2.825 kg (6 lb 3.7 oz) F Vag-Spont None  LIV  1 Preterm 03/09/11 549w0d  1.871 kg (4 lb 2 oz) M Vag-Spont None Y LIV     Past Medical History:  Diagnosis Date  . No pertinent past medical history   . Preterm labor    Past Surgical History:  Procedure Laterality Date  . FRACTURE SURGERY     broken arm in childhood, cast only, no surgery   Family History  Problem Relation Age of Onset  . Bipolar disorder Father   . Depression Father   . Anxiety disorder Father      Exam    Uterus:  Fundal Height: 26 cm  Pelvic Exam:    Perineum: No Hemorrhoids   Vulva: normal   Vagina:  normal mucosa   pH: n/a   Cervix: multiparous appearance and no lesions   Adnexa: normal adnexa   Bony Pelvis: average  System: Breast:  normal appearance, no masses or tenderness   Skin: normal coloration and turgor, no rashes    Neurologic: oriented, normal   Extremities: normal strength, tone, and muscle mass   HEENT PERRLA and extra ocular movement intact   Mouth/Teeth mucous membranes moist, pharynx normal without lesions and dental hygiene good   Neck supple and no masses   Cardiovascular: regular rate and rhythm   Respiratory:  appears well, vitals normal, no respiratory distress, acyanotic, normal RR, ear  and throat exam is normal, neck free of mass or lymphadenopathy, chest clear, no wheezing, crepitations, rhonchi, normal symmetric air entry   Abdomen: soft, non-tender; bowel sounds normal; no masses,  no organomegaly   Urinary: urethral meatus normal      Assessment:    Pregnancy: [id number] Patient Active Problem List   Diagnosis Date Noted  . High risk pregnancy due to history of preterm labor, antepartum 12/17/2017  . History of preterm premature rupture of membranes (PPROM) 12/17/2017  . NSVD (normal spontaneous vaginal delivery) 03/24/2016  . Premature rupture of membranes 03/23/2016  . History of preterm delivery, currently pregnant in second trimester 11/18/2015  . Late prenatal care 11/18/2015        Plan:    Discussed use of pregesterone to prevent preterm birth with Dr. Vergie LivingPickens. Will offer to patient but data is insufficient to strongly recommend use at this gestational age. Patient would like to try vaginal prometrium. Will prescribe.  Initial labs drawn. Prenatal vitamins. Problem list reviewed and updated. Genetic Screening discussed: too late  Ultrasound discussed; fetal survey: ordered.  Follow up in 2 weeks. Will get GTT at next visit.   Chubb Corporationmber Beula Joyner 12/17/2017

## 2017-12-19 LAB — CULTURE, OB URINE

## 2017-12-19 LAB — URINE CULTURE, OB REFLEX: Organism ID, Bacteria: NO GROWTH

## 2017-12-22 LAB — CYTOLOGY - PAP
Chlamydia: NEGATIVE
Diagnosis: NEGATIVE
Neisseria Gonorrhea: NEGATIVE

## 2017-12-24 ENCOUNTER — Other Ambulatory Visit (HOSPITAL_COMMUNITY): Payer: Self-pay | Admitting: *Deleted

## 2017-12-24 ENCOUNTER — Other Ambulatory Visit (HOSPITAL_COMMUNITY): Payer: Self-pay | Admitting: Family Medicine

## 2017-12-24 ENCOUNTER — Ambulatory Visit (HOSPITAL_COMMUNITY)
Admission: RE | Admit: 2017-12-24 | Discharge: 2017-12-24 | Disposition: A | Discharge status: Home / Self Care | Payer: Medicaid Other | Source: Ambulatory Visit | Attending: Family Medicine | Admitting: Family Medicine

## 2017-12-24 ENCOUNTER — Encounter (HOSPITAL_COMMUNITY): Payer: Self-pay

## 2017-12-24 DIAGNOSIS — O0932 Supervision of pregnancy with insufficient antenatal care, second trimester: Secondary | ICD-10-CM | POA: Diagnosis not present

## 2017-12-24 DIAGNOSIS — Z3A27 27 weeks gestation of pregnancy: Secondary | ICD-10-CM | POA: Diagnosis not present

## 2017-12-24 DIAGNOSIS — O26842 Uterine size-date discrepancy, second trimester: Secondary | ICD-10-CM | POA: Insufficient documentation

## 2017-12-24 DIAGNOSIS — O30009 Twin pregnancy, unspecified number of placenta and unspecified number of amniotic sacs, unspecified trimester: Secondary | ICD-10-CM

## 2017-12-24 DIAGNOSIS — O365930 Maternal care for other known or suspected poor fetal growth, third trimester, not applicable or unspecified: Secondary | ICD-10-CM

## 2017-12-24 DIAGNOSIS — Z3689 Encounter for other specified antenatal screening: Secondary | ICD-10-CM | POA: Diagnosis not present

## 2017-12-24 DIAGNOSIS — Z3A21 21 weeks gestation of pregnancy: Secondary | ICD-10-CM

## 2017-12-24 DIAGNOSIS — Z363 Encounter for antenatal screening for malformations: Secondary | ICD-10-CM | POA: Insufficient documentation

## 2017-12-24 DIAGNOSIS — O09212 Supervision of pregnancy with history of pre-term labor, second trimester: Secondary | ICD-10-CM | POA: Insufficient documentation

## 2017-12-24 LAB — AB SCR+ANTIBODY ID: Antibody Screen: POSITIVE — AB

## 2017-12-24 LAB — SMN1 COPY NUMBER ANALYSIS (SMA CARRIER SCREENING)

## 2017-12-24 LAB — OBSTETRIC PANEL, INCLUDING HIV
BASOS: 0 %
Basophils Absolute: 0 10*3/uL (ref 0.0–0.2)
EOS (ABSOLUTE): 0.2 10*3/uL (ref 0.0–0.4)
Eos: 1 %
HEMATOCRIT: 29.2 % — AB (ref 34.0–46.6)
HEMOGLOBIN: 10.1 g/dL — AB (ref 11.1–15.9)
HIV SCREEN 4TH GENERATION: NONREACTIVE
Hepatitis B Surface Ag: NEGATIVE
Immature Grans (Abs): 0.1 10*3/uL (ref 0.0–0.1)
Immature Granulocytes: 1 %
Lymphocytes Absolute: 2.9 10*3/uL (ref 0.7–3.1)
Lymphs: 20 %
MCH: 30.2 pg (ref 26.6–33.0)
MCHC: 34.6 g/dL (ref 31.5–35.7)
MCV: 87 fL (ref 79–97)
MONOS ABS: 0.8 10*3/uL (ref 0.1–0.9)
Monocytes: 6 %
NEUTROS ABS: 10.2 10*3/uL — AB (ref 1.4–7.0)
Neutrophils: 72 %
Platelets: 387 10*3/uL — ABNORMAL HIGH (ref 150–379)
RBC: 3.34 x10E6/uL — AB (ref 3.77–5.28)
RDW: 14.2 % (ref 12.3–15.4)
RPR Ser Ql: NONREACTIVE
Rh Factor: POSITIVE
Rubella Antibodies, IGG: 1.76 index (ref >0.99)
WBC: 14.2 10*3/uL — ABNORMAL HIGH (ref 3.4–10.8)

## 2017-12-24 LAB — HEMOGLOBINOPATHY EVALUATION
Ferritin: 24 ng/mL (ref 15–150)
HGB A2 QUANT: 2.3 % (ref 1.8–3.2)
HGB A: 97 % (ref 96.4–98.8)
HGB S: 0 %
HGB SOLUBILITY: NEGATIVE
HGB VARIANT: 0 %
Hgb C: 0 %
Hgb F Quant: 0.7 % (ref 0.0–2.0)

## 2017-12-24 LAB — CYSTIC FIBROSIS GENE TEST

## 2017-12-25 ENCOUNTER — Telehealth: Payer: Self-pay | Admitting: General Practice

## 2017-12-25 DIAGNOSIS — Z3A27 27 weeks gestation of pregnancy: Secondary | ICD-10-CM

## 2017-12-25 MED ORDER — PRENATAL VITAMIN PLUS LOW IRON 27-1 MG PO TABS: 1.0000 | ORAL_TABLET | Freq: Every day | ORAL | 11 refills | Status: DC

## 2017-12-25 NOTE — Telephone Encounter (Signed)
Patient called and left message on nurse line requesting Rx for PNV. PNV sent in per protocol. Called patient, no answer- left message on voicemail stating we are trying to reach you to return your phone call, we have sent in that prescription you requested to your [facility] pharmacy. You may call us back if you have other questions

## 2017-12-26 ENCOUNTER — Other Ambulatory Visit: Payer: Self-pay | Admitting: General Practice

## 2017-12-26 DIAGNOSIS — O09219 Supervision of pregnancy with history of pre-term labor, unspecified trimester: Secondary | ICD-10-CM

## 2017-12-29 ENCOUNTER — Other Ambulatory Visit: Payer: Medicaid Other

## 2017-12-31 ENCOUNTER — Encounter (HOSPITAL_COMMUNITY): Payer: Self-pay

## 2017-12-31 ENCOUNTER — Telehealth: Payer: Self-pay | Admitting: General Practice

## 2017-12-31 ENCOUNTER — Ambulatory Visit (HOSPITAL_COMMUNITY)
Admission: RE | Admit: 2017-12-31 | Discharge: 2017-12-31 | Disposition: A | Discharge status: Home / Self Care | Payer: Medicaid Other | Source: Ambulatory Visit | Attending: Family Medicine | Admitting: Family Medicine

## 2017-12-31 NOTE — Telephone Encounter (Signed)
Patient called and left message stating she is still waiting on her PNV Rx as the ones she has makes her throw up. Per chart review, Rx was sent 3/7. Called patient stating I am returning her phone call and asked if she received the Rx that was sent on 3/7. Patient states she didn't know she had a prescription at the pharmacy but will go pick it up. Patient had no questions

## 2018-01-01 ENCOUNTER — Encounter: Payer: Medicaid Other | Admitting: Obstetrics and Gynecology

## 2018-01-01 ENCOUNTER — Telehealth: Payer: Self-pay

## 2018-01-01 ENCOUNTER — Ambulatory Visit (INDEPENDENT_AMBULATORY_CARE_PROVIDER_SITE_OTHER): Payer: Medicaid Other | Admitting: Clinical

## 2018-01-01 DIAGNOSIS — F4381 Prolonged grief disorder: Secondary | ICD-10-CM

## 2018-01-01 DIAGNOSIS — F4321 Adjustment disorder with depressed mood: Secondary | ICD-10-CM | POA: Diagnosis not present

## 2018-01-01 NOTE — BH Specialist Note (Signed)
Integrated Behavioral Health Initial Visit  MRN: [id number] Name: Sandra Matthews  Number of Integrated Behavioral Health Clinician visits:: 1/6 Session Start time: 3:45  Session End time: 5:00 Total time: 1 hour  Type of Service: Integrated Behavioral Health- Individual/Family Interpretor:No. Interpretor Name and Language: n/a   Warm Hand Off Completed.       SUBJECTIVE: Sandra Matthews is a 22 y.o. female accompanied by n/a Patient was referred by Dr Alysia PennaErvin for Depression. Patient reports the following symptoms/concerns: Pt states her primary concern today is feeling sick/nausea, with no appetite,  that is making her feel depressed, fatigue, worry about her baby, irritable. Pt denies any current SI or HI. Pt says she had "thoughts of harming me and my kids" over two months ago, but no intent,  and the thoughts scared her. She attributes the thoughts to feeling overwhelmed, along with grieving the loss of her brother 5 months ago.  Duration of problem: Increase this pregnancy; Severity of problem: severe  OBJECTIVE: Mood: Anxious, Depressed and Irritable and Affect: Depressed and Tearful Risk of harm to self or others: Suicidal ideation No plan to harm self or others  LIFE CONTEXT: Family and Social: Pt lives with FOB, and children(6,1); her mother is her greatest support(lives local) School/Work: Currently out of work; plans to return to work postpartum Self-Care: No appetite, lack of quality sleep (sleeps 6pm-1am), no substances Life Changes: Lost brother 5 months ago; current pregnancy with no appetite  GOALS ADDRESSED: Patient will: 1. Reduce symptoms of: agitation, anxiety, depression and stress 2. Increase knowledge and/or ability of: self-management skills  3. 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  PLAN: 1. Follow up with behavioral health clinician on : One week 2. Behavioral recommendations:  -Follow safety plan -CALM relaxation breathing every morning and at bedtime; as needed throughout the day -Spend 20 minutes outside every morning -Consider apps for additional self-coping  -Read educational materials regarding coping with symptoms of depression and anxiety  3. Referral(s): Integrated Art gallery managerBehavioral Health Services (In Clinic), Community Mental Health Services (LME/Outside Clinic) and Community Resources:  [facility] 4. "From scale of 1-10, how likely are you to follow plan?": 9  Rae LipsJamie C Inaara Tye, LCSW   Depression screen [facility] 2/9 12/17/2017 12/05/2015 11/28/2015  Decreased Interest 0 0 0  Down, Depressed, Hopeless 0 0 0  PHQ - 2 Score 0 0 0  Altered sleeping 1 - -  Tired, decreased energy 1 - -  Change in appetite 1 - -  Feeling bad or failure about yourself  0 - -  Trouble concentrating 0 - -  Moving slowly or fidgety/restless 0 - -  Suicidal thoughts 0 - -  PHQ-9 Score 3 - -   GAD 7 : Generalized Anxiety Score 12/17/2017  Nervous, Anxious, on Edge 1  Control/stop worrying 0  Worry too much - different things 1  Trouble relaxing 1  Restless 0  Easily annoyed or irritable 1  Afraid - awful might happen 0  Total GAD 7 Score 4

## 2018-01-01 NOTE — Telephone Encounter (Signed)
Pt called the office stating that she is stressed and needs medication because of her poor appetite and stress. Called pt back and got vm. Left message for pt to call the office.

## 2018-01-01 NOTE — Patient Instructions (Signed)
My Safety Plan:   Step 1: Warning signs (thoughts, images, mood, situation, behavior) that a crisis may be developing: If I start getting upset about little things, getting angry really quick  Step 2: Internal coping strategies: Things I can do to take my mind off my problems without contacting another person (music, relaxation technique, physical activity): Clean up and move around  Step 3: People I can ask for help:  Name, relationship, contact: Marchelle Folksmanda, [phone] Name, relationship, contact: Mom, [phone]  Step 5: Agencies I can contact during a crisis:      1. 9-1-1     2. [facility] (24/7 walk-in) [address], [place], [place]     3. [facility]: Intake- [phone]/ [phone]     4. Closest Emergency Room Address: [facility] (see below)  Suicide Prevention Lifeline Phone: [phone]  Step 6: Making the environment safe- Have friend or family remove from home: Mother will do this for me * Weapons in the home * Medication in the home (including Tylenol)  Step 7: The one thing that is most important to me and worth living for is: My kids and my mother  Signature of Patient: _________________________________________________  Signature of Provider: ________________________________________________  [facility] [address], [place], [place] [phone]  [facility] [address], [place], [place] [phone]  [facility] [address], [place], [place] [phone]  [facility] [address] [phone]  [facility] [address], [place], [place] [phone]

## 2018-01-02 ENCOUNTER — Encounter: Payer: Medicaid Other | Admitting: Family Medicine

## 2018-01-07 ENCOUNTER — Encounter: Payer: Self-pay | Admitting: Obstetrics and Gynecology

## 2018-01-07 ENCOUNTER — Ambulatory Visit (HOSPITAL_COMMUNITY)
Admission: RE | Admit: 2018-01-07 | Discharge: 2018-01-07 | Disposition: A | Discharge status: Home / Self Care | Payer: Medicaid Other | Source: Ambulatory Visit | Attending: Family Medicine | Admitting: Family Medicine

## 2018-01-07 ENCOUNTER — Encounter (HOSPITAL_COMMUNITY): Payer: Self-pay

## 2018-01-07 ENCOUNTER — Other Ambulatory Visit (HOSPITAL_COMMUNITY): Payer: Self-pay | Admitting: Obstetrics and Gynecology

## 2018-01-07 DIAGNOSIS — O361990 Maternal care for other isoimmunization, unspecified trimester, not applicable or unspecified: Secondary | ICD-10-CM | POA: Insufficient documentation

## 2018-01-07 DIAGNOSIS — IMO0002 Reserved for concepts with insufficient information to code with codable children: Secondary | ICD-10-CM | POA: Insufficient documentation

## 2018-01-07 DIAGNOSIS — Z3A29 29 weeks gestation of pregnancy: Secondary | ICD-10-CM | POA: Insufficient documentation

## 2018-01-07 DIAGNOSIS — O365930 Maternal care for other known or suspected poor fetal growth, third trimester, not applicable or unspecified: Secondary | ICD-10-CM | POA: Insufficient documentation

## 2018-01-07 DIAGNOSIS — O0933 Supervision of pregnancy with insufficient antenatal care, third trimester: Secondary | ICD-10-CM | POA: Insufficient documentation

## 2018-01-12 ENCOUNTER — Other Ambulatory Visit: Payer: Self-pay

## 2018-01-12 DIAGNOSIS — O0990 Supervision of high risk pregnancy, unspecified, unspecified trimester: Secondary | ICD-10-CM

## 2018-01-13 ENCOUNTER — Other Ambulatory Visit: Payer: Medicaid Other

## 2018-01-13 ENCOUNTER — Encounter: Payer: Medicaid Other | Admitting: Family Medicine

## 2018-01-14 ENCOUNTER — Encounter (HOSPITAL_COMMUNITY): Payer: Self-pay

## 2018-01-14 ENCOUNTER — Other Ambulatory Visit (HOSPITAL_COMMUNITY): Payer: Self-pay | Admitting: Obstetrics and Gynecology

## 2018-01-14 ENCOUNTER — Ambulatory Visit (HOSPITAL_COMMUNITY)
Admission: RE | Admit: 2018-01-14 | Discharge: 2018-01-14 | Disposition: A | Discharge status: Home / Self Care | Payer: Medicaid Other | Source: Ambulatory Visit | Attending: Family Medicine | Admitting: Family Medicine

## 2018-01-14 DIAGNOSIS — Z3A30 30 weeks gestation of pregnancy: Secondary | ICD-10-CM | POA: Insufficient documentation

## 2018-01-14 DIAGNOSIS — O365930 Maternal care for other known or suspected poor fetal growth, third trimester, not applicable or unspecified: Secondary | ICD-10-CM | POA: Insufficient documentation

## 2018-01-15 ENCOUNTER — Other Ambulatory Visit (HOSPITAL_COMMUNITY): Payer: Self-pay | Admitting: *Deleted

## 2018-01-15 DIAGNOSIS — O365930 Maternal care for other known or suspected poor fetal growth, third trimester, not applicable or unspecified: Secondary | ICD-10-CM

## 2018-01-21 ENCOUNTER — Other Ambulatory Visit: Payer: Medicaid Other

## 2018-01-22 ENCOUNTER — Encounter (HOSPITAL_COMMUNITY): Payer: Self-pay

## 2018-01-22 ENCOUNTER — Other Ambulatory Visit (HOSPITAL_COMMUNITY): Payer: Self-pay | Admitting: Obstetrics and Gynecology

## 2018-01-22 ENCOUNTER — Ambulatory Visit (HOSPITAL_COMMUNITY)
Admission: RE | Admit: 2018-01-22 | Discharge: 2018-01-22 | Disposition: A | Discharge status: Home / Self Care | Payer: Medicaid Other | Source: Ambulatory Visit | Attending: Obstetrics and Gynecology | Admitting: Obstetrics and Gynecology

## 2018-01-22 DIAGNOSIS — Z3A31 31 weeks gestation of pregnancy: Secondary | ICD-10-CM

## 2018-01-22 DIAGNOSIS — O365930 Maternal care for other known or suspected poor fetal growth, third trimester, not applicable or unspecified: Secondary | ICD-10-CM | POA: Insufficient documentation

## 2018-01-22 DIAGNOSIS — O0933 Supervision of pregnancy with insufficient antenatal care, third trimester: Secondary | ICD-10-CM

## 2018-01-22 DIAGNOSIS — O09213 Supervision of pregnancy with history of pre-term labor, third trimester: Secondary | ICD-10-CM | POA: Insufficient documentation

## 2018-01-23 ENCOUNTER — Encounter: Payer: Medicaid Other | Admitting: Obstetrics and Gynecology

## 2018-01-29 ENCOUNTER — Ambulatory Visit (HOSPITAL_COMMUNITY)
Admission: RE | Admit: 2018-01-29 | Discharge: 2018-01-29 | Disposition: A | Discharge status: Home / Self Care | Payer: Medicaid Other | Source: Ambulatory Visit | Attending: Obstetrics and Gynecology | Admitting: Obstetrics and Gynecology

## 2018-01-29 ENCOUNTER — Other Ambulatory Visit (HOSPITAL_COMMUNITY): Payer: Self-pay | Admitting: Obstetrics and Gynecology

## 2018-01-29 ENCOUNTER — Encounter (HOSPITAL_COMMUNITY): Payer: Self-pay

## 2018-01-29 DIAGNOSIS — Z3A32 32 weeks gestation of pregnancy: Secondary | ICD-10-CM

## 2018-01-29 DIAGNOSIS — O09213 Supervision of pregnancy with history of pre-term labor, third trimester: Secondary | ICD-10-CM | POA: Insufficient documentation

## 2018-01-29 DIAGNOSIS — O365930 Maternal care for other known or suspected poor fetal growth, third trimester, not applicable or unspecified: Secondary | ICD-10-CM | POA: Insufficient documentation

## 2018-01-29 DIAGNOSIS — O09219 Supervision of pregnancy with history of pre-term labor, unspecified trimester: Secondary | ICD-10-CM

## 2018-01-29 DIAGNOSIS — O09899 Supervision of other high risk pregnancies, unspecified trimester: Secondary | ICD-10-CM

## 2018-01-29 DIAGNOSIS — O0933 Supervision of pregnancy with insufficient antenatal care, third trimester: Secondary | ICD-10-CM | POA: Insufficient documentation

## 2018-01-30 ENCOUNTER — Encounter: Payer: Medicaid Other | Admitting: Obstetrics & Gynecology

## 2018-02-05 ENCOUNTER — Ambulatory Visit (HOSPITAL_COMMUNITY)
Admission: RE | Admit: 2018-02-05 | Discharge: 2018-02-05 | Disposition: A | Discharge status: Home / Self Care | Payer: Medicaid Other | Source: Ambulatory Visit | Attending: Obstetrics & Gynecology | Admitting: Obstetrics & Gynecology

## 2018-02-05 ENCOUNTER — Other Ambulatory Visit (HOSPITAL_COMMUNITY): Payer: Self-pay | Admitting: Obstetrics and Gynecology

## 2018-02-05 ENCOUNTER — Encounter (HOSPITAL_COMMUNITY): Payer: Self-pay

## 2018-02-05 DIAGNOSIS — O365930 Maternal care for other known or suspected poor fetal growth, third trimester, not applicable or unspecified: Secondary | ICD-10-CM | POA: Insufficient documentation

## 2018-02-05 DIAGNOSIS — O0933 Supervision of pregnancy with insufficient antenatal care, third trimester: Secondary | ICD-10-CM

## 2018-02-05 DIAGNOSIS — Z3A33 33 weeks gestation of pregnancy: Secondary | ICD-10-CM | POA: Insufficient documentation

## 2018-02-05 DIAGNOSIS — O09219 Supervision of pregnancy with history of pre-term labor, unspecified trimester: Secondary | ICD-10-CM

## 2018-02-06 ENCOUNTER — Other Ambulatory Visit (HOSPITAL_COMMUNITY): Payer: Self-pay | Admitting: *Deleted

## 2018-02-12 ENCOUNTER — Encounter: Payer: Medicaid Other | Admitting: Family Medicine

## 2018-02-12 ENCOUNTER — Inpatient Hospital Stay (HOSPITAL_COMMUNITY)
Admission: AD | Admit: 2018-02-12 | Discharge: 2018-02-12 | Discharge status: Against Medical Advice | Payer: Medicaid Other | Source: Ambulatory Visit | Attending: Obstetrics and Gynecology | Admitting: Obstetrics and Gynecology

## 2018-02-12 ENCOUNTER — Encounter (HOSPITAL_COMMUNITY): Payer: Self-pay

## 2018-02-12 ENCOUNTER — Ambulatory Visit (HOSPITAL_COMMUNITY)
Admission: RE | Admit: 2018-02-12 | Discharge: 2018-02-12 | Disposition: A | Discharge status: Home / Self Care | Payer: Medicaid Other | Source: Ambulatory Visit | Attending: Obstetrics & Gynecology | Admitting: Obstetrics & Gynecology

## 2018-02-12 NOTE — MAU Note (Signed)
Not in lobby x3 Assume pt left.

## 2018-02-12 NOTE — MAU Note (Signed)
Not in lobby x2.

## 2018-02-12 NOTE — MAU Note (Signed)
Not in lobby x1  

## 2018-02-14 ENCOUNTER — Encounter (HOSPITAL_COMMUNITY): Payer: Self-pay | Admitting: *Deleted

## 2018-02-14 ENCOUNTER — Inpatient Hospital Stay (HOSPITAL_COMMUNITY)
Admission: AD | Admit: 2018-02-14 | Discharge: 2018-02-14 | Disposition: A | Discharge status: Home / Self Care | Payer: Medicaid Other | Source: Ambulatory Visit | Attending: Obstetrics and Gynecology | Admitting: Obstetrics and Gynecology

## 2018-02-14 DIAGNOSIS — O99323 Drug use complicating pregnancy, third trimester: Secondary | ICD-10-CM | POA: Insufficient documentation

## 2018-02-14 DIAGNOSIS — O0930 Supervision of pregnancy with insufficient antenatal care, unspecified trimester: Secondary | ICD-10-CM | POA: Diagnosis not present

## 2018-02-14 DIAGNOSIS — O212 Late vomiting of pregnancy: Secondary | ICD-10-CM | POA: Insufficient documentation

## 2018-02-14 DIAGNOSIS — O3433 Maternal care for cervical incompetence, third trimester: Secondary | ICD-10-CM | POA: Diagnosis not present

## 2018-02-14 DIAGNOSIS — M549 Dorsalgia, unspecified: Secondary | ICD-10-CM | POA: Insufficient documentation

## 2018-02-14 DIAGNOSIS — O0933 Supervision of pregnancy with insufficient antenatal care, third trimester: Secondary | ICD-10-CM

## 2018-02-14 DIAGNOSIS — Z3A34 34 weeks gestation of pregnancy: Secondary | ICD-10-CM | POA: Diagnosis not present

## 2018-02-14 DIAGNOSIS — O219 Vomiting of pregnancy, unspecified: Secondary | ICD-10-CM

## 2018-02-14 DIAGNOSIS — O26893 Other specified pregnancy related conditions, third trimester: Secondary | ICD-10-CM | POA: Insufficient documentation

## 2018-02-14 DIAGNOSIS — F1290 Cannabis use, unspecified, uncomplicated: Secondary | ICD-10-CM

## 2018-02-14 HISTORY — DX: Cannabis use, unspecified, uncomplicated: F12.90

## 2018-02-14 LAB — URINALYSIS, ROUTINE W REFLEX MICROSCOPIC
Bacteria, UA: NONE SEEN
Bilirubin Urine: NEGATIVE
Glucose, UA: NEGATIVE mg/dL
Hgb urine dipstick: NEGATIVE
Ketones, ur: 80 mg/dL — AB
Leukocytes, UA: NEGATIVE
Nitrite: NEGATIVE
Protein, ur: 100 mg/dL — AB
Specific Gravity, Urine: 1.027 (ref 1.005–1.030)
pH: 6 (ref 5.0–8.0)

## 2018-02-14 LAB — RAPID URINE DRUG SCREEN, HOSP PERFORMED
Amphetamines: NOT DETECTED
Barbiturates: NOT DETECTED
Benzodiazepines: NOT DETECTED
Cocaine: NOT DETECTED
Opiates: NOT DETECTED
Tetrahydrocannabinol: POSITIVE — AB

## 2018-02-14 LAB — FETAL FIBRONECTIN: Fetal Fibronectin: POSITIVE — AB

## 2018-02-14 MED ORDER — LACTATED RINGERS IV BOLUS
1000.0000 mL | Freq: Once | INTRAVENOUS | Status: AC
  Administered 2018-02-14: 1000 mL via INTRAVENOUS

## 2018-02-14 MED ORDER — M.V.I. ADULT IV INJ
Freq: Once | INTRAVENOUS | Status: AC
  Administered 2018-02-14: 02:00:00 via INTRAVENOUS
  Filled 2018-02-14: qty 10

## 2018-02-14 MED ORDER — DEXTROSE 5 % IN LACTATED RINGERS IV BOLUS: 1000.0000 mL | Freq: Once | INTRAVENOUS | Status: DC

## 2018-02-14 MED ORDER — CYCLOBENZAPRINE HCL 10 MG PO TABS
10.0000 mg | ORAL_TABLET | Freq: Once | ORAL | Status: AC
  Administered 2018-02-14: 10 mg via ORAL
  Filled 2018-02-14: qty 1

## 2018-02-14 MED ORDER — CYCLOBENZAPRINE HCL 10 MG PO TABS: 10.0000 mg | ORAL_TABLET | Freq: Two times a day (BID) | ORAL | 0 refills | Status: DC | PRN

## 2018-02-14 MED ORDER — ONDANSETRON 4 MG PO TBDP
4.0000 mg | ORAL_TABLET | Freq: Three times a day (TID) | ORAL | 1 refills | Status: DC | PRN
Start: 2018-02-14 — End: 2018-02-24

## 2018-02-14 MED ORDER — SODIUM CHLORIDE 0.9 % IV SOLN
8.0000 mg | Freq: Once | INTRAVENOUS | Status: AC
  Administered 2018-02-14: 8 mg via INTRAVENOUS
  Filled 2018-02-14: qty 4

## 2018-02-14 NOTE — MAU Note (Signed)
Back pain, N and V since yesterday. Denies bleeding or leakage of fluid

## 2018-02-14 NOTE — MAU Provider Note (Signed)
Chief Complaint:  Back Pain and Emesis During Pregnancy  HPI: Sandra Matthews is a 22 y.o. [id number] at 67w4dwho presents to maternity admissions reporting back pain and N/V. She reports back pain and N/V since yesterday. She rate pain 7/10- has not taken any medication for pain. She reports vomiting over 5 times in the past 24 hours, has vomited 3 times since arriving to MAU- has not taken any medication for vomiting she has zofran prescribed, but reports she has no refills left. She reports good fetal movement, denies LOF, vaginal bleeding, abdominal pain or contractions, vaginal itching/burning, urinary symptoms, h/a, dizziness, or fever/chills. She [facility] at [facility], has not been seen for care since her initial prenatal appointment on 12/17/17. She has been going to her MFM [place] appointments for this high risk pregnancy due to IUGR and a hx of PTD. She currently takes prometrium vaginal nightly not on 17P.   Past Medical History: Past Medical History:  Diagnosis Date  . No pertinent past medical history   . Preterm labor     Past obstetric history: OB History  Gravida Para Term Preterm AB Living  0 2  SAB TAB Ectopic Multiple Live Births  0 0 0 0 2    # Outcome Date GA Lbr Len/2nd Weight Sex Delivery Anes PTL Lv  3 Current           2 Term 03/24/16 [redacted]w[redacted]d 18:33 / 00:04 6 lb 3.7 oz (2.825 kg) F Vag-Spont None  LIV  1 Preterm 03/09/11 101w0d  4 lb 2 oz (1.871 kg) M Vag-Spont None Y LIV    Past Surgical History: Past Surgical History:  Procedure Laterality Date  . FRACTURE SURGERY     broken arm in childhood, cast only, no surgery    Family History: Family History  Problem Relation Age of Onset  . Bipolar disorder Father   . Depression Father   . Anxiety disorder Father     Social History: Social History   Tobacco Use  . Smoking status: Never Smoker  . Smokeless tobacco: Never Used  Substance Use Topics  . Alcohol use: No  . Drug use: Yes   Types: Marijuana    Comment: was doing it to increase appetite, but stopped one month ago    Allergies: No Known Allergies  Meds:  Medications Prior to Admission  Medication Sig Dispense Refill Last Dose  . doxylamine, Sleep, (UNISOM) 25 MG tablet Take 1 tablet (25 mg total) by mouth at bedtime. (Patient not taking: Reported on 01/29/2018) 30 tablet 1 Not Taking  . ondansetron (ZOFRAN) 4 MG tablet Take 1 tablet (4 mg total) by mouth every 6 (six) hours. (Patient not taking: Reported on 01/22/2018) 12 tablet 0 Not Taking  . Prenatal Vit-Fe Fumarate-FA (PRENATAL VITAMIN PLUS LOW IRON) 27-1 MG TABS Take 1 tablet by mouth daily. 30 tablet 11 Taking  . Prenatal Vit-Fe Phos-FA-Omega (VITAFOL GUMMIES) 3.33-0.333-34.8 MG CHEW Chew 3 each by mouth daily.   Not Taking  . progesterone (PROMETRIUM) 100 MG capsule Place 1 capsule (100 mg total) vaginally at bedtime. 30 capsule 3 Taking  . promethazine (PHENERGAN) 25 MG tablet Take 1 tablet (25 mg total) by mouth every 6 (six) hours as needed for nausea or vomiting. (Patient not taking: Reported on 12/17/2017) 30 tablet 0 Not Taking  . vitamin B-6 (PYRIDOXINE) 25 MG tablet Take 1 tablet (25 mg total) by mouth 3 (three) times daily. (Patient not taking: Reported on 01/14/2018) 90  tablet 1 Not Taking    ROS:  Review of Systems  Constitutional: Negative.   Respiratory: Negative.   Cardiovascular: Negative.   Gastrointestinal: Positive for nausea and vomiting. Negative for abdominal pain, constipation and diarrhea.  Genitourinary: Negative.   Musculoskeletal: Positive for back pain. Negative for joint swelling and neck pain.  Neurological: Negative.    I have reviewed patient's Past Medical Hx, Surgical Hx, Family Hx, Social Hx, medications and allergies.   Physical Exam   Patient Vitals for the past 24 hrs:  BP Temp Temp src Pulse Resp SpO2 Weight  02/14/18 0255 - - - 95 - 100 % -  02/14/18 0248 108/66 98.8 F (37.1 C) Oral (!) 110 17 - -  02/14/18  0036 - - - - - 97 % -  02/14/18 0031 - - - - - 98 % -  02/14/18 0030 122/73 97.7 F (36.5 C) Oral (!) 110 17 - 126 lb (57.2 kg)   Constitutional: Well-developed, well-nourished female in no acute distress.  Cardiovascular: normal rate Respiratory: normal effort GI: Abd soft, non-tender, gravid small for gestational age.  MS: Extremities nontender, no edema, normal ROM Neurologic: Alert and oriented x 4.  GU: Neg CVAT.  CERVICAL EXAM: no cervical change in 1.5 hours    Dilation: 2.5 Effacement (%): 50 Cervical Position: Posterior Station: -3 Presentation: Vertex Exam by:: Lanice Shirts, cnm   FHT:  Baseline 130 , moderate variability, accelerations present, no decelerations Contractions: UI   Labs: Results for orders placed or performed during the hospital encounter of 02/14/18 (from the past 24 hour(s))  Urinalysis, Routine w reflex microscopic     Status: Abnormal   Collection Time: 02/14/18 12:22 AM  Result Value Ref Range   Color, Urine YELLOW YELLOW   APPearance HAZY (A) CLEAR   Specific Gravity, Urine 1.027 1.005 - 1.030   pH 6.0 5.0 - 8.0   Glucose, UA NEGATIVE NEGATIVE mg/dL   Hgb urine dipstick NEGATIVE NEGATIVE   Bilirubin Urine NEGATIVE NEGATIVE   Ketones, ur 80 (A) NEGATIVE mg/dL   Protein, ur 841 (A) NEGATIVE mg/dL   Nitrite NEGATIVE NEGATIVE   Leukocytes, UA NEGATIVE NEGATIVE   RBC / HPF 0-5 0 - 5 RBC/hpf   WBC, UA 0-5 0 - 5 WBC/hpf   Bacteria, UA NONE SEEN NONE SEEN   Squamous Epithelial / LPF 6-10 0 - 5   Mucus PRESENT    Hyaline Casts, UA PRESENT   Rapid urine drug screen (hospital performed)     Status: Abnormal   Collection Time: 02/14/18 12:22 AM  Result Value Ref Range   Opiates NONE DETECTED NONE DETECTED   Cocaine NONE DETECTED NONE DETECTED   Benzodiazepines NONE DETECTED NONE DETECTED   Amphetamines NONE DETECTED NONE DETECTED   Tetrahydrocannabinol POSITIVE (A) NONE DETECTED   Barbiturates NONE DETECTED NONE DETECTED  Fetal fibronectin      Status: Abnormal   Collection Time: 02/14/18  1:10 AM  Result Value Ref Range   Fetal Fibronectin POSITIVE (A) NEGATIVE   O/Positive/-- (02/27 1409)  MAU Course/MDM: Orders Placed This Encounter  Procedures  . Urinalysis, Routine w reflex microscopic  . Rapid urine drug screen (hospital performed)  . Insert peripheral IV   UA- showed 80 ketones and 100 protein, otherwise negative for nitrates  UDS- showed + for THC, obtained due to patient physically having the appearance of being under the influence and the room smelling like marijuana  FFN- positive, unlikely to be true positive since patient is  over [redacted] weeks gestation.   Meds ordered this encounter  Medications  . DISCONTD: dextrose 5% lactated ringers bolus 1,000 mL  . ondansetron (ZOFRAN) 8 mg in sodium chloride 0.9 % 50 mL IVPB  . multivitamins adult (MVI -12) 10 mL in dextrose 5% lactated ringers 1,000 mL infusion  . lactated ringers bolus 1,000 mL  . cyclobenzaprine (FLEXERIL) tablet 10 mg  . cyclobenzaprine (FLEXERIL) 10 MG tablet    Sig: Take 1 tablet (10 mg total) by mouth 2 (two) times daily as needed for muscle spasms.    Dispense:  30 tablet    Refill:  0    Order Specific Question:   Supervising Provider    Answer:   Tilda Burrow [[id number]]  . ondansetron (ZOFRAN ODT) 4 MG disintegrating tablet    Sig: Take 1 tablet (4 mg total) by mouth every 8 (eight) hours as needed for nausea or vomiting.    Dispense:  30 tablet    Refill:  1    Order Specific Question:   Supervising Provider    Answer:   Tilda Burrow [[id number]]   NST reviewed-reactive   Treatments in MAU included 1,030mL bolus of LR,  Zofran in NS, and multivitamin bag in D5LR. Patient reports nausea and vomiting is relieved after treatment. Patient able to keep down crackers and juice.  Flexeril given for back pain after reassessment of cervix. Patient reports relief of back pain after medication treatment.   Discussed with patient in detail the  importance of coming to every prenatal appointment scheduled from now until delivery. Pt reports transportation and childcare issues with why she was missing appointment. Educated on this being a high risk pregnancy with her hx and baby being smaller than expected at this gestation age. Patient verbalizes understanding and plans to be at next appointment on 4/30.   Pt discharge with strict Preterm labor precautions.  Today's evaluation included a work-up for preterm labor which can be life-threatening for both mom and baby.  Assessment: 1. Back pain during pregnancy in third trimester   2. Nausea and vomiting during pregnancy   3. Premature cervical dilation, third trimester   4. Limited prenatal care, antepartum   5. Marijuana use     Plan: Discharge home Preterm Labor precautions and fetal kick counts Follow up as scheduled for prenatal appointments  Return to MAU as needed for emergencies  Rx for Zofran and flexeril sent to pharmacy of choice    Allergies as of 02/14/2018   No Known Allergies     Medication List    STOP taking these medications   ondansetron 4 MG tablet Commonly known as:  ZOFRAN     TAKE these medications   cyclobenzaprine 10 MG tablet Commonly known as:  FLEXERIL Take 1 tablet (10 mg total) by mouth 2 (two) times daily as needed for muscle spasms.   doxylamine (Sleep) 25 MG tablet Commonly known as:  UNISOM Take 1 tablet (25 mg total) by mouth at bedtime.   ondansetron 4 MG disintegrating tablet Commonly known as:  ZOFRAN ODT Take 1 tablet (4 mg total) by mouth every 8 (eight) hours as needed for nausea or vomiting.   PRENATAL VITAMIN PLUS LOW IRON 27-1 MG Tabs Take 1 tablet by mouth daily.   progesterone 100 MG capsule Commonly known as:  PROMETRIUM Place 1 capsule (100 mg total) vaginally at bedtime.   promethazine 25 MG tablet Commonly known as:  PHENERGAN Take 1 tablet (25 mg total) by mouth  every 6 (six) hours as needed for nausea or  vomiting.   VITAFOL GUMMIES 3.33-0.333-34.8 MG Chew Chew 3 each by mouth daily.   vitamin B-6 25 MG tablet Commonly known as:  pyridOXINE Take 1 tablet (25 mg total) by mouth 3 (three) times daily.       Steward Drone Certified Nurse-Midwife 02/14/2018 2:45 AM

## 2018-02-17 ENCOUNTER — Other Ambulatory Visit (HOSPITAL_COMMUNITY)
Admission: RE | Admit: 2018-02-17 | Discharge: 2018-02-17 | Disposition: A | Discharge status: Home / Self Care | Payer: Medicaid Other | Source: Ambulatory Visit | Attending: Family Medicine | Admitting: Family Medicine

## 2018-02-17 ENCOUNTER — Ambulatory Visit (INDEPENDENT_AMBULATORY_CARE_PROVIDER_SITE_OTHER): Payer: Medicaid Other | Admitting: Obstetrics and Gynecology

## 2018-02-17 ENCOUNTER — Encounter: Payer: Self-pay | Admitting: Obstetrics and Gynecology

## 2018-02-17 VITALS — BP 120/69 | HR 103 | Wt 129.7 lb

## 2018-02-17 DIAGNOSIS — Z8759 Personal history of other complications of pregnancy, childbirth and the puerperium: Secondary | ICD-10-CM

## 2018-02-17 DIAGNOSIS — O09212 Supervision of pregnancy with history of pre-term labor, second trimester: Secondary | ICD-10-CM

## 2018-02-17 DIAGNOSIS — IMO0002 Reserved for concepts with insufficient information to code with codable children: Secondary | ICD-10-CM

## 2018-02-17 DIAGNOSIS — O09892 Supervision of other high risk pregnancies, second trimester: Secondary | ICD-10-CM

## 2018-02-17 DIAGNOSIS — Z23 Encounter for immunization: Secondary | ICD-10-CM

## 2018-02-17 DIAGNOSIS — O09219 Supervision of pregnancy with history of pre-term labor, unspecified trimester: Secondary | ICD-10-CM

## 2018-02-17 DIAGNOSIS — O0933 Supervision of pregnancy with insufficient antenatal care, third trimester: Secondary | ICD-10-CM

## 2018-02-17 DIAGNOSIS — O0993 Supervision of high risk pregnancy, unspecified, third trimester: Secondary | ICD-10-CM | POA: Diagnosis present

## 2018-02-17 NOTE — Patient Instructions (Signed)
Third Trimester of Pregnancy The third trimester is from week 28 through week 40 (months 7 through 9). The third trimester is a time when the unborn baby (fetus) is growing rapidly. At the end of the ninth month, the fetus is about 20 inches in length and weighs 6-10 pounds. Body changes during your third trimester Your body will continue to go through many changes during pregnancy. The changes vary from woman to woman. During the third trimester:  Your weight will continue to increase. You can expect to gain 25-35 pounds (11-16 kg) by the end of the pregnancy.  You may begin to get stretch marks on your hips, abdomen, and breasts.  You may urinate more often because the fetus is moving lower into your pelvis and pressing on your bladder.  You may develop or continue to have heartburn. This is caused by increased hormones that slow down muscles in the digestive tract.  You may develop or continue to have constipation because increased hormones slow digestion and cause the muscles that push waste through your intestines to relax.  You may develop hemorrhoids. These are swollen veins (varicose veins) in the rectum that can itch or be painful.  You may develop swollen, bulging veins (varicose veins) in your legs.  You may have increased body aches in the pelvis, back, or thighs. This is due to weight gain and increased hormones that are relaxing your joints.  You may have changes in your hair. These can include thickening of your hair, rapid growth, and changes in texture. Some women also have hair loss during or after pregnancy, or hair that feels dry or thin. Your hair will most likely return to normal after your baby is born.  Your breasts will continue to grow and they will continue to become tender. A yellow fluid (colostrum) may leak from your breasts. This is the first milk you are producing for your baby.  Your belly button may stick out.  You may notice more swelling in your hands,  face, or ankles.  You may have increased tingling or numbness in your hands, arms, and legs. The skin on your belly may also feel numb.  You may feel short of breath because of your expanding uterus.  You may have more problems sleeping. This can be caused by the size of your belly, increased need to urinate, and an increase in your body's metabolism.  You may notice the fetus "dropping," or moving lower in your abdomen (lightening).  You may have increased vaginal discharge.  You may notice your joints feel loose and you may have pain around your pelvic bone.  What to expect at prenatal visits You will have prenatal exams every 2 weeks until week 36. Then you will have weekly prenatal exams. During a routine prenatal visit:  You will be weighed to make sure you and the baby are growing normally.  Your blood pressure will be taken.  Your abdomen will be measured to track your baby's growth.  The fetal heartbeat will be listened to.  Any test results from the previous visit will be discussed.  You may have a cervical check near your due date to see if your cervix has softened or thinned (effaced).  You will be tested for Group B streptococcus. This happens between 35 and 37 weeks.  Your health care provider may ask you:  What your birth plan is.  How you are feeling.  If you are feeling the baby move.  If you have had   any abnormal symptoms, such as leaking fluid, bleeding, severe headaches, or abdominal cramping.  If you are using any tobacco products, including cigarettes, chewing tobacco, and electronic cigarettes.  If you have any questions.  Other tests or screenings that may be performed during your third trimester include:  Blood tests that check for low iron levels (anemia).  Fetal testing to check the health, activity level, and growth of the fetus. Testing is done if you have certain medical conditions or if there are problems during the  pregnancy.  Nonstress test (NST). This test checks the health of your baby to make sure there are no signs of problems, such as the baby not getting enough oxygen. During this test, a belt is placed around your belly. The baby is made to move, and its heart rate is monitored during movement.  What is false labor? False labor is a condition in which you feel small, irregular tightenings of the muscles in the womb (contractions) that usually go away with rest, changing position, or drinking water. These are called Braxton Hicks contractions. Contractions may last for hours, days, or even weeks before true labor sets in. If contractions come at regular intervals, become more frequent, increase in intensity, or become painful, you should see your health care provider. What are the signs of labor?  Abdominal cramps.  Regular contractions that start at 10 minutes apart and become stronger and more frequent with time.  Contractions that start on the top of the uterus and spread down to the lower abdomen and back.  Increased pelvic pressure and dull back pain.  A watery or bloody mucus discharge that comes from the vagina.  Leaking of amniotic fluid. This is also known as your "water breaking." It could be a slow trickle or a gush. Let your health care provider know if it has a color or strange odor. If you have any of these signs, call your health care provider right away, even if it is before your due date. Follow these instructions at home: Medicines  Follow your health care provider's instructions regarding medicine use. Specific medicines may be either safe or unsafe to take during pregnancy.  Take a prenatal vitamin that contains at least 600 micrograms (mcg) of folic acid.  If you develop constipation, try taking a stool softener if your health care provider approves. Eating and drinking  Eat a balanced diet that includes fresh fruits and vegetables, whole grains, good sources of protein  such as meat, eggs, or tofu, and low-fat dairy. Your health care provider will help you determine the amount of weight gain that is right for you.  Avoid raw meat and uncooked cheese. These carry germs that can cause birth defects in the baby.  If you have low calcium intake from food, talk to your health care provider about whether you should take a daily calcium supplement.  Eat four or five small meals rather than three large meals a day.  Limit foods that are high in fat and processed sugars, such as fried and sweet foods.  To prevent constipation: ? Drink enough fluid to keep your urine clear or pale yellow. ? Eat foods that are high in fiber, such as fresh fruits and vegetables, whole grains, and beans. Activity  Exercise only as directed by your health care provider. Most women can continue their usual exercise routine during pregnancy. Try to exercise for 30 minutes at least 5 days a week. Stop exercising if you experience uterine contractions.  Avoid heavy   lifting.  Do not exercise in extreme heat or humidity, or at high altitudes.  Wear low-heel, comfortable shoes.  Practice good posture.  You may continue to have sex unless your health care provider tells you otherwise. Relieving pain and discomfort  Take frequent breaks and rest with your legs elevated if you have leg cramps or low back pain.  Take warm sitz baths to soothe any pain or discomfort caused by hemorrhoids. Use hemorrhoid cream if your health care provider approves.  Wear a good support bra to prevent discomfort from breast tenderness.  If you develop varicose veins: ? Wear support pantyhose or compression stockings as told by your healthcare provider. ? Elevate your feet for 15 minutes, 3-4 times a day. Prenatal care  Write down your questions. Take them to your prenatal visits.  Keep all your prenatal visits as told by your health care provider. This is important. Safety  Wear your seat belt at  all times when driving.  Make a list of emergency phone numbers, including numbers for family, friends, the hospital, and police and fire departments. General instructions  Avoid cat litter boxes and soil used by cats. These carry germs that can cause birth defects in the baby. If you have a cat, ask someone to clean the litter box for you.  Do not travel far distances unless it is absolutely necessary and only with the approval of your health care provider.  Do not use hot tubs, steam rooms, or saunas.  Do not drink alcohol.  Do not use any products that contain nicotine or tobacco, such as cigarettes and e-cigarettes. If you need help quitting, ask your health care provider.  Do not use any medicinal herbs or unprescribed drugs. These chemicals affect the formation and growth of the baby.  Do not douche or use tampons or scented sanitary pads.  Do not cross your legs for long periods of time.  To prepare for the arrival of your baby: ? Take prenatal classes to understand, practice, and ask questions about labor and delivery. ? Make a trial run to the hospital. ? Visit the hospital and tour the maternity area. ? Arrange for maternity or paternity leave through employers. ? Arrange for family and friends to take care of pets while you are in the hospital. ? Purchase a rear-facing car seat and make sure you know how to install it in your car. ? Pack your hospital bag. ? Prepare the baby's nursery. Make sure to remove all pillows and stuffed animals from the baby's crib to prevent suffocation.  Visit your dentist if you have not gone during your pregnancy. Use a soft toothbrush to brush your teeth and be gentle when you floss. Contact a health care provider if:  You are unsure if you are in labor or if your water has broken.  You become dizzy.  You have mild pelvic cramps, pelvic pressure, or nagging pain in your abdominal area.  You have lower back pain.  You have persistent  nausea, vomiting, or diarrhea.  You have an unusual or bad smelling vaginal discharge.  You have pain when you urinate. Get help right away if:  Your water breaks before 37 weeks.  You have regular contractions less than 5 minutes apart before 37 weeks.  You have a fever.  You are leaking fluid from your vagina.  You have spotting or bleeding from your vagina.  You have severe abdominal pain or cramping.  You have rapid weight loss or weight gain.    You have shortness of breath with chest pain.  You notice sudden or extreme swelling of your face, hands, ankles, feet, or legs.  Your baby makes fewer than 10 movements in 2 hours.  You have severe headaches that do not go away when you take medicine.  You have vision changes. Summary  The third trimester is from week 28 through week 40, months 7 through 9. The third trimester is a time when the unborn baby (fetus) is growing rapidly.  During the third trimester, your discomfort may increase as you and your baby continue to gain weight. You may have abdominal, leg, and back pain, sleeping problems, and an increased need to urinate.  During the third trimester your breasts will keep growing and they will continue to become tender. A yellow fluid (colostrum) may leak from your breasts. This is the first milk you are producing for your baby.  False labor is a condition in which you feel small, irregular tightenings of the muscles in the womb (contractions) that eventually go away. These are called Braxton Hicks contractions. Contractions may last for hours, days, or even weeks before true labor sets in.  Signs of labor can include: abdominal cramps; regular contractions that start at 10 minutes apart and become stronger and more frequent with time; watery or bloody mucus discharge that comes from the vagina; increased pelvic pressure and dull back pain; and leaking of amniotic fluid. This information is not intended to replace advice  given to you by your health care provider. Make sure you discuss any questions you have with your health care provider. Document Released: 10/01/2001 Document Revised: 03/14/2016 Document Reviewed: 12/08/2012 Elsevier Interactive Patient Education  2017 [employer]  

## 2018-02-17 NOTE — Progress Notes (Signed)
Subjective:  Sandra Matthews is a 22 y.o. [phone] at [redacted]w[redacted]d being seen today for ongoing prenatal care.  She is currently monitored for the following issues for this high-risk pregnancy and has History of preterm delivery, currently pregnant in second trimester; Late prenatal care; High risk pregnancy due to history of preterm labor, antepartum; History of preterm premature rupture of membranes (PPROM); Anti-M isoimmunization affecting pregnancy, antepartum; Poor fetal growth; Limited prenatal care in third trimester; and Marijuana use on their problem list.  Patient reports no complaints.  Contractions: Not present. Vag. Bleeding: None.  Movement: Present. Denies leaking of fluid.   The following portions of the patient's history were reviewed and updated as appropriate: allergies, current medications, past family history, past medical history, past social history, past surgical history and problem list. Problem list updated.  Objective:   Vitals:   02/17/18 0912  BP: 120/69  Pulse: (!) 103  Weight: 58.8 kg (129 lb 11.2 oz)    Fetal Status: Fetal Heart Rate (bpm): 140   Movement: Present     General:  Alert, oriented and cooperative. Patient is in no acute distress.  Skin: Skin is warm and dry. No rash noted.   Cardiovascular: Normal heart rate noted  Respiratory: Normal respiratory effort, no problems with respiration noted  Abdomen: Soft, gravid, appropriate for gestational age. Pain/Pressure: Present     Pelvic:  Cervical exam performed        Extremities: Normal range of motion.  Edema: None  Mental Status: Normal mood and affect. Normal behavior. Normal judgment and thought content.   Urinalysis:      Assessment and Plan:  Pregnancy: G3P1102 at [redacted]w[redacted]d  1. Supervision of high risk pregnancy, antepartum, third trimester Pt has had transportation and child care issues. Importance of keeping OB appts discussed with pt. Will complete missing labs today - CBC - HIV  antibody - RPR - Tdap vaccine greater than or equal to 7yo IM - Strep Gp B NAA - Cervicovaginal ancillary only - Glucose Tolerance, 1 Hour  2. Need for diphtheria-tetanus-pertussis (Tdap) vaccine  - Tdap vaccine greater than or equal to 7yo IM  3. High risk pregnancy due to history of preterm labor, antepartum      D/T PROM at 28 weeks      Stable on Prometrium, will continue to 36 weeks  5. History of preterm premature rupture of membranes (PPROM)     See above  6. Limited prenatal care in third trimester     See above  7. Poor fetal growth     Weekly BPP and UA Dopplers with MFM   Preterm labor symptoms and general obstetric precautions including but not limited to vaginal bleeding, contractions, leaking of fluid and fetal movement were reviewed in detail with the patient. Please refer to After Visit Summary for other counseling recommendations.  No follow-ups on file.   Hermina Staggers, MD

## 2018-02-17 NOTE — Progress Notes (Signed)
Reports poor appetite

## 2018-02-18 LAB — CERVICOVAGINAL ANCILLARY ONLY
CHLAMYDIA, DNA PROBE: NEGATIVE
NEISSERIA GONORRHEA: NEGATIVE

## 2018-02-18 LAB — RPR: RPR: NONREACTIVE

## 2018-02-18 LAB — CBC
HEMOGLOBIN: 9.7 g/dL — AB (ref 11.1–15.9)
Hematocrit: 28.3 % — ABNORMAL LOW (ref 34.0–46.6)
MCH: 30.4 pg (ref 26.6–33.0)
MCHC: 34.3 g/dL (ref 31.5–35.7)
MCV: 89 fL (ref 79–97)
Platelets: 326 10*3/uL (ref 150–379)
RBC: 3.19 x10E6/uL — AB (ref 3.77–5.28)
RDW: 13.8 % (ref 12.3–15.4)
WBC: 9.9 10*3/uL (ref 3.4–10.8)

## 2018-02-18 LAB — HIV ANTIBODY (ROUTINE TESTING W REFLEX): HIV Screen 4th Generation wRfx: NONREACTIVE

## 2018-02-18 LAB — GLUCOSE TOLERANCE, 1 HOUR: Glucose, 1Hr PP: 125 mg/dL (ref 65–199)

## 2018-02-19 ENCOUNTER — Ambulatory Visit (HOSPITAL_COMMUNITY)
Admission: RE | Admit: 2018-02-19 | Discharge: 2018-02-19 | Disposition: A | Discharge status: Home / Self Care | Payer: Medicaid Other | Source: Ambulatory Visit | Attending: Obstetrics & Gynecology | Admitting: Obstetrics & Gynecology

## 2018-02-19 LAB — STREP GP B NAA: Strep Gp B NAA: POSITIVE — AB

## 2018-02-24 ENCOUNTER — Other Ambulatory Visit: Payer: Self-pay

## 2018-02-24 ENCOUNTER — Encounter (HOSPITAL_COMMUNITY): Payer: Self-pay | Admitting: *Deleted

## 2018-02-24 ENCOUNTER — Inpatient Hospital Stay (HOSPITAL_COMMUNITY): Payer: Medicaid Other

## 2018-02-24 ENCOUNTER — Inpatient Hospital Stay (HOSPITAL_COMMUNITY)
Admission: AD | Admit: 2018-02-24 | Discharge: 2018-02-24 | Disposition: A | Discharge status: Home / Self Care | Payer: Medicaid Other | Source: Ambulatory Visit | Attending: Obstetrics & Gynecology | Admitting: Obstetrics & Gynecology

## 2018-02-24 DIAGNOSIS — Z3A36 36 weeks gestation of pregnancy: Secondary | ICD-10-CM | POA: Insufficient documentation

## 2018-02-24 DIAGNOSIS — M549 Dorsalgia, unspecified: Secondary | ICD-10-CM | POA: Diagnosis not present

## 2018-02-24 DIAGNOSIS — O9989 Other specified diseases and conditions complicating pregnancy, childbirth and the puerperium: Secondary | ICD-10-CM

## 2018-02-24 DIAGNOSIS — F1290 Cannabis use, unspecified, uncomplicated: Secondary | ICD-10-CM | POA: Insufficient documentation

## 2018-02-24 DIAGNOSIS — M545 Low back pain: Secondary | ICD-10-CM | POA: Diagnosis present

## 2018-02-24 DIAGNOSIS — O365930 Maternal care for other known or suspected poor fetal growth, third trimester, not applicable or unspecified: Secondary | ICD-10-CM | POA: Diagnosis not present

## 2018-02-24 DIAGNOSIS — O99891 Other specified diseases and conditions complicating pregnancy: Secondary | ICD-10-CM

## 2018-02-24 DIAGNOSIS — R109 Unspecified abdominal pain: Secondary | ICD-10-CM | POA: Insufficient documentation

## 2018-02-24 DIAGNOSIS — O99323 Drug use complicating pregnancy, third trimester: Secondary | ICD-10-CM | POA: Insufficient documentation

## 2018-02-24 DIAGNOSIS — O26893 Other specified pregnancy related conditions, third trimester: Secondary | ICD-10-CM | POA: Insufficient documentation

## 2018-02-24 DIAGNOSIS — R102 Pelvic and perineal pain: Secondary | ICD-10-CM | POA: Insufficient documentation

## 2018-02-24 DIAGNOSIS — O0933 Supervision of pregnancy with insufficient antenatal care, third trimester: Secondary | ICD-10-CM | POA: Diagnosis not present

## 2018-02-24 LAB — URINALYSIS, ROUTINE W REFLEX MICROSCOPIC
Bacteria, UA: NONE SEEN
Bilirubin Urine: NEGATIVE
GLUCOSE, UA: NEGATIVE mg/dL
HGB URINE DIPSTICK: NEGATIVE
Ketones, ur: NEGATIVE mg/dL
Leukocytes, UA: NEGATIVE
NITRITE: NEGATIVE
PH: 6 (ref 5.0–8.0)
Protein, ur: 30 mg/dL — AB
SPECIFIC GRAVITY, URINE: 1.024 (ref 1.005–1.030)
Squamous Epithelial / LPF: >50 — ABNORMAL HIGH (ref 0–5)

## 2018-02-24 NOTE — MAU Provider Note (Signed)
History     CSN: [id number]  Arrival date and time: 02/24/18 4098   First Provider Initiated Contact with Patient 02/24/18 [phone]      Chief Complaint  Patient presents with  . Back Pain  . Pelvic Pressure   Sandra Matthews is a 22 y.o. [id number] at [redacted]w[redacted]d who presents today with back pain and pelvic pressure. She was given flexeril for this, but states that it has not helped. She denies any contractions, VB or LOF. She reports normal fetal movement. She goes to [facility] for [facility], next visit 02/26/18. She is followed for IUGR and has missed her last 2 visit for dopplers with MFM. She states that she is planning on going to her visits this week.   Back Pain  This is a new problem. The current episode started in the past 7 days. The problem occurs intermittently. The problem is unchanged. The pain is present in the lumbar spine. Radiates to: abdomen and pelvis  The pain is at a severity of 5/10. The pain is worse during the day. Exacerbated by: sitting dow for a while and then stand up. Associated symptoms include abdominal pain and pelvic pain. Pertinent negatives include no dysuria or fever. Risk factors include pregnancy. She has tried muscle relaxant for the symptoms. The treatment provided no relief.   Past Medical History:  Diagnosis Date  . No pertinent past medical history   . Preterm labor     Past Surgical History:  Procedure Laterality Date  . FRACTURE SURGERY     broken arm in childhood, cast only, no surgery    Family History  Problem Relation Age of Onset  . Bipolar disorder Father   . Depression Father   . Anxiety disorder Father     Social History   Tobacco Use  . Smoking status: Never Smoker  . Smokeless tobacco: Never Used  Substance Use Topics  . Alcohol use: No  . Drug use: Yes    Types: Marijuana    Comment: was doing it to increase appetite, but stopped one month ago    Allergies: No Known Allergies  Medications Prior to Admission  Medication  Sig Dispense Refill Last Dose  . cyclobenzaprine (FLEXERIL) 10 MG tablet Take 1 tablet (10 mg total) by mouth 2 (two) times daily as needed for muscle spasms. 30 tablet 0 Taking  . doxylamine, Sleep, (UNISOM) 25 MG tablet Take 1 tablet (25 mg total) by mouth at bedtime. (Patient not taking: Reported on 01/29/2018) 30 tablet 1 Not Taking  . ondansetron (ZOFRAN ODT) 4 MG disintegrating tablet Take 1 tablet (4 mg total) by mouth every 8 (eight) hours as needed for nausea or vomiting. (Patient not taking: Reported on 02/17/2018) 30 tablet 1 Not Taking  . Prenatal Vit-Fe Fumarate-FA (PRENATAL VITAMIN PLUS LOW IRON) 27-1 MG TABS Take 1 tablet by mouth daily. 30 tablet 11 Taking  . Prenatal Vit-Fe Phos-FA-Omega (VITAFOL GUMMIES) 3.33-0.333-34.8 MG CHEW Chew 3 each by mouth daily.   Not Taking  . progesterone (PROMETRIUM) 100 MG capsule Place 1 capsule (100 mg total) vaginally at bedtime. 30 capsule 3 Taking  . promethazine (PHENERGAN) 25 MG tablet Take 1 tablet (25 mg total) by mouth every 6 (six) hours as needed for nausea or vomiting. (Patient not taking: Reported on 12/17/2017) 30 tablet 0 Not Taking  . vitamin B-6 (PYRIDOXINE) 25 MG tablet Take 1 tablet (25 mg total) by mouth 3 (three) times daily. (Patient not taking: Reported on 01/14/2018) 90 tablet 1  Not Taking    Review of Systems  Constitutional: Negative for chills and fever.  Gastrointestinal: Positive for abdominal pain. Negative for nausea and vomiting.  Genitourinary: Positive for pelvic pain. Negative for dysuria, frequency, urgency, vaginal bleeding and vaginal discharge.  Musculoskeletal: Positive for back pain.   Physical Exam   Blood pressure 110/67, pulse 98, temperature 98.4 F (36.9 C), temperature source Oral, resp. rate 18, height  (1.575 m), weight 131 lb 4 oz (59.5 kg), last menstrual period 06/17/2017, SpO2 100 %, unknown if currently breastfeeding.  Physical Exam  Nursing note and vitals reviewed. Constitutional: She is  oriented to person, place, and time. She appears well-developed and well-nourished. No distress.  HENT:  Head: Normocephalic.  Cardiovascular: Normal rate.  Respiratory: Effort normal.  GI: Soft. There is no tenderness.  Genitourinary:  Genitourinary Comments: No CVA tenderness   Musculoskeletal: Normal range of motion. She exhibits no tenderness.  Neurological: She is alert and oriented to person, place, and time.  Skin: Skin is warm and dry.  Psychiatric: She has a normal mood and affect.   FHT; 135, moderate with 15x15 accels, no decels Toco: no UCs  MAU Course  Procedures  MDM 0919: Consult with Dr. Lovett Sox, will get dopplers today as patient has no-show for last 2 visits.   DW MFM, dopplers are normal today.   Assessment and Plan   1. Back pain affecting pregnancy in third trimester   2. Limited prenatal care in third trimester   3. Marijuana use   4. [redacted] weeks gestation of pregnancy    DC home Comfort measures reviewed  3rd Trimester precautions  PTL precautions  Fetal kick counts RX: none  Return to MAU as needed FU with OB as planned  Follow-up Information    [facility] .   Specialty:  Radiology Contact information: [address] [id number] [place] [address] [phone]       [facility].   Specialty:  Obstetrics and Gynecology Contact information: [address] [place] [address] [phone]           Thressa Sheller 02/24/2018, 9:07 AM

## 2018-02-24 NOTE — Discharge Instructions (Signed)

## 2018-02-24 NOTE — MAU Note (Signed)
Pt presents with c/o lower abdominal back & abdominal pain and pelvic pressure that began 2 days ago.  Reports pain med previously prescribed isn't helping (unsure of name of med).  Denies VB or LOF.  Reports +FM.

## 2018-02-26 ENCOUNTER — Ambulatory Visit (INDEPENDENT_AMBULATORY_CARE_PROVIDER_SITE_OTHER): Payer: Medicaid Other | Admitting: Obstetrics & Gynecology

## 2018-02-26 ENCOUNTER — Ambulatory Visit (HOSPITAL_COMMUNITY): Admission: RE | Admit: 2018-02-26 | Payer: Medicaid Other | Source: Ambulatory Visit

## 2018-02-26 ENCOUNTER — Other Ambulatory Visit (HOSPITAL_COMMUNITY)
Admission: RE | Admit: 2018-02-26 | Discharge: 2018-02-26 | Disposition: A | Discharge status: Home / Self Care | Payer: Medicaid Other | Source: Ambulatory Visit | Attending: Obstetrics & Gynecology | Admitting: Obstetrics & Gynecology

## 2018-02-26 VITALS — BP 114/66 | HR 90 | Wt 130.0 lb

## 2018-02-26 DIAGNOSIS — O0933 Supervision of pregnancy with insufficient antenatal care, third trimester: Secondary | ICD-10-CM

## 2018-02-26 DIAGNOSIS — Z3A36 36 weeks gestation of pregnancy: Secondary | ICD-10-CM | POA: Insufficient documentation

## 2018-02-26 DIAGNOSIS — O09213 Supervision of pregnancy with history of pre-term labor, third trimester: Secondary | ICD-10-CM | POA: Insufficient documentation

## 2018-02-26 DIAGNOSIS — O09219 Supervision of pregnancy with history of pre-term labor, unspecified trimester: Secondary | ICD-10-CM | POA: Diagnosis present

## 2018-02-26 LAB — OB RESULTS CONSOLE GC/CHLAMYDIA: Gonorrhea: NEGATIVE

## 2018-02-26 NOTE — Progress Notes (Signed)
   PRENATAL VISIT NOTE  Subjective:  Sandra Matthews is a 22 y.o. [phone] at [redacted]w[redacted]d being seen today for ongoing prenatal care.  She is currently monitored for the following issues for this high-risk pregnancy and has History of preterm delivery, currently pregnant in second trimester; Late prenatal care; High risk pregnancy due to history of preterm labor, antepartum; History of preterm premature rupture of membranes (PPROM); Anti-M isoimmunization affecting pregnancy, antepartum; Poor fetal growth; Limited prenatal care in third trimester; and Marijuana use on their problem list.  Patient reports no complaints.  Contractions: Not present. Vag. Bleeding: None.  Movement: Present. Denies leaking of fluid.   The following portions of the patient's history were reviewed and updated as appropriate: allergies, current medications, past family history, past medical history, past social history, past surgical history and problem list. Problem list updated.  Objective:   Vitals:   02/26/18 1138  BP: 114/66  Pulse: 90  Weight: 130 lb (59 kg)    Fetal Status:     Movement: Present     General:  Alert, oriented and cooperative. Patient is in no acute distress.  Skin: Skin is warm and dry. No rash noted.   Cardiovascular: Normal heart rate noted  Respiratory: Normal respiratory effort, no problems with respiration noted  Abdomen: Soft, gravid, appropriate for gestational age.  Pain/Pressure: Present     Pelvic: Cervical exam performed        Extremities: Normal range of motion.  Edema: None  Mental Status: Normal mood and affect. Normal behavior. Normal judgment and thought content.   Assessment and Plan:  Pregnancy: G3P1102 at [redacted]w[redacted]d  1. High risk pregnancy due to history of preterm labor, antepartum  - Culture, beta strep (group b only) - Cervicovaginal ancillary only  2. Limited prenatal care in third trimester - follow up MFM u/s today  Preterm labor symptoms and general  obstetric precautions including but not limited to vaginal bleeding, contractions, leaking of fluid and fetal movement were reviewed in detail with the patient. Please refer to After Visit Summary for other counseling recommendations.  No follow-ups on file.  Future Appointments  Date Time Provider Department Center  02/26/2018  2:45 PM [facility] [place] [facility]-M[facility] MFC-US  03/04/2018  9:35 AM Allie Bossier, MD [facility]  03/05/2018  2:45 PM [facility] [place] [facility] MFC-US  03/12/2018  2:45 PM [facility] [place] [facility]-M[facility] MFC-US    Allie Bossier, MD

## 2018-02-27 LAB — CERVICOVAGINAL ANCILLARY ONLY
Chlamydia: NEGATIVE
Neisseria Gonorrhea: NEGATIVE

## 2018-03-01 LAB — CULTURE, BETA STREP (GROUP B ONLY): Strep Gp B Culture: POSITIVE — AB

## 2018-03-02 ENCOUNTER — Encounter: Payer: Self-pay | Admitting: Obstetrics & Gynecology

## 2018-03-02 DIAGNOSIS — O99820 Streptococcus B carrier state complicating pregnancy: Secondary | ICD-10-CM | POA: Insufficient documentation

## 2018-03-03 ENCOUNTER — Ambulatory Visit (HOSPITAL_COMMUNITY)
Admission: RE | Admit: 2018-03-03 | Discharge: 2018-03-03 | Disposition: A | Discharge status: Home / Self Care | Payer: Medicaid Other | Source: Ambulatory Visit | Attending: Obstetrics & Gynecology | Admitting: Obstetrics & Gynecology

## 2018-03-03 ENCOUNTER — Other Ambulatory Visit (HOSPITAL_COMMUNITY): Payer: Self-pay | Admitting: Maternal and Fetal Medicine

## 2018-03-03 ENCOUNTER — Encounter (HOSPITAL_COMMUNITY): Payer: Self-pay

## 2018-03-03 DIAGNOSIS — O09213 Supervision of pregnancy with history of pre-term labor, third trimester: Secondary | ICD-10-CM | POA: Diagnosis not present

## 2018-03-03 DIAGNOSIS — O09893 Supervision of other high risk pregnancies, third trimester: Secondary | ICD-10-CM

## 2018-03-03 DIAGNOSIS — O0933 Supervision of pregnancy with insufficient antenatal care, third trimester: Secondary | ICD-10-CM

## 2018-03-03 DIAGNOSIS — Z3A37 37 weeks gestation of pregnancy: Secondary | ICD-10-CM | POA: Diagnosis not present

## 2018-03-03 DIAGNOSIS — O365930 Maternal care for other known or suspected poor fetal growth, third trimester, not applicable or unspecified: Secondary | ICD-10-CM | POA: Insufficient documentation

## 2018-03-04 ENCOUNTER — Ambulatory Visit (INDEPENDENT_AMBULATORY_CARE_PROVIDER_SITE_OTHER): Payer: Medicaid Other | Admitting: Obstetrics & Gynecology

## 2018-03-04 VITALS — BP 121/88 | HR 92 | Wt 129.9 lb

## 2018-03-04 DIAGNOSIS — O09892 Supervision of other high risk pregnancies, second trimester: Secondary | ICD-10-CM

## 2018-03-04 DIAGNOSIS — IMO0002 Reserved for concepts with insufficient information to code with codable children: Secondary | ICD-10-CM

## 2018-03-04 DIAGNOSIS — O09212 Supervision of pregnancy with history of pre-term labor, second trimester: Secondary | ICD-10-CM

## 2018-03-04 DIAGNOSIS — O99820 Streptococcus B carrier state complicating pregnancy: Secondary | ICD-10-CM

## 2018-03-04 MED ORDER — MULTIPLE VITAMINS-IRON 15 MG PO CHEW: CHEWABLE_TABLET | ORAL | 4 refills | Status: DC

## 2018-03-04 NOTE — Progress Notes (Signed)
   PRENATAL VISIT NOTE  Subjective:  Sandra Matthews is a 22 y.o. [phone] at [redacted]w[redacted]d being seen today for ongoing prenatal care.  She is currently monitored for the following issues for this high-risk pregnancy and has History of preterm delivery, currently pregnant in second trimester; Late prenatal care; High risk pregnancy due to history of preterm labor, antepartum; History of preterm premature rupture of membranes (PPROM); Anti-M isoimmunization affecting pregnancy, antepartum; Poor fetal growth; Limited prenatal care in third trimester; Marijuana use; and GBS (group B Streptococcus carrier), +RV culture, currently pregnant on their problem list.  Patient reports nausea with her vitamins.  Contractions: Irregular. Vag. Bleeding: None.  Movement: Present. Denies leaking of fluid.   The following portions of the patient's history were reviewed and updated as appropriate: allergies, current medications, past family history, past medical history, past social history, past surgical history and problem list. Problem list updated.  Objective:   Vitals:   03/04/18 1009  BP: 121/88  Pulse: 92  Weight: 129 lb 14.4 oz (58.9 kg)    Fetal Status: Fetal Heart Rate (bpm): 130   Movement: Present     General:  Alert, oriented and cooperative. Patient is in no acute distress.  Skin: Skin is warm and dry. No rash noted.   Cardiovascular: Normal heart rate noted  Respiratory: Normal respiratory effort, no problems with respiration noted  Abdomen: Soft, gravid, appropriate for gestational age.  Pain/Pressure: Present     Pelvic: Cervical exam deferred        Extremities: Normal range of motion.  Edema: None  Mental Status: Normal mood and affect. Normal behavior. Normal judgment and thought content.   Assessment and Plan:  Pregnancy: G3P1102 at [redacted]w[redacted]d  1. Poor fetal growth - mfm u/s yesterday showed 3% growth, normal dopplers - rec IOL by 39 weeks - prescribed chewable vitamins per her  request  2. History of preterm delivery, currently pregnant in second trimester   3. GBS (group B Streptococcus carrier), +RV culture, currently pregnant - treat in labor  Preterm labor symptoms and general obstetric precautions including but not limited to vaginal bleeding, contractions, leaking of fluid and fetal movement were reviewed in detail with the patient. Please refer to After Visit Summary for other counseling recommendations.  No follow-ups on file.  Future Appointments  Date Time Provider Department Center  03/10/2018  2:30 PM [facility] [place] 1 [facility]    Allie Bossier, MD

## 2018-03-05 ENCOUNTER — Ambulatory Visit (HOSPITAL_COMMUNITY): Payer: Medicaid Other

## 2018-03-10 ENCOUNTER — Encounter (HOSPITAL_COMMUNITY): Payer: Self-pay

## 2018-03-10 ENCOUNTER — Other Ambulatory Visit (HOSPITAL_COMMUNITY): Payer: Self-pay | Admitting: Maternal and Fetal Medicine

## 2018-03-10 ENCOUNTER — Ambulatory Visit (HOSPITAL_COMMUNITY)
Admission: RE | Admit: 2018-03-10 | Discharge: 2018-03-10 | Disposition: A | Discharge status: Home / Self Care | Payer: Medicaid Other | Source: Ambulatory Visit | Attending: Obstetrics & Gynecology | Admitting: Obstetrics & Gynecology

## 2018-03-10 DIAGNOSIS — O365930 Maternal care for other known or suspected poor fetal growth, third trimester, not applicable or unspecified: Secondary | ICD-10-CM | POA: Diagnosis present

## 2018-03-10 DIAGNOSIS — O0933 Supervision of pregnancy with insufficient antenatal care, third trimester: Secondary | ICD-10-CM

## 2018-03-10 DIAGNOSIS — Z3A38 38 weeks gestation of pregnancy: Secondary | ICD-10-CM | POA: Diagnosis not present

## 2018-03-10 DIAGNOSIS — O09213 Supervision of pregnancy with history of pre-term labor, third trimester: Secondary | ICD-10-CM | POA: Insufficient documentation

## 2018-03-10 DIAGNOSIS — F1290 Cannabis use, unspecified, uncomplicated: Secondary | ICD-10-CM

## 2018-03-10 DIAGNOSIS — O99820 Streptococcus B carrier state complicating pregnancy: Secondary | ICD-10-CM

## 2018-03-10 NOTE — Addendum Note (Signed)
Encounter addended by: Drue Novel, RT on: 03/10/2018 4:12 PM  Actions taken: Imaging Exam ended

## 2018-03-12 ENCOUNTER — Ambulatory Visit (HOSPITAL_COMMUNITY): Payer: Medicaid Other

## 2018-03-12 ENCOUNTER — Ambulatory Visit (INDEPENDENT_AMBULATORY_CARE_PROVIDER_SITE_OTHER): Payer: Medicaid Other | Admitting: Obstetrics & Gynecology

## 2018-03-12 VITALS — BP 117/73 | HR 85 | Wt 130.0 lb

## 2018-03-12 DIAGNOSIS — O0930 Supervision of pregnancy with insufficient antenatal care, unspecified trimester: Secondary | ICD-10-CM

## 2018-03-12 DIAGNOSIS — O09213 Supervision of pregnancy with history of pre-term labor, third trimester: Secondary | ICD-10-CM

## 2018-03-12 DIAGNOSIS — O09219 Supervision of pregnancy with history of pre-term labor, unspecified trimester: Secondary | ICD-10-CM

## 2018-03-12 DIAGNOSIS — Z8759 Personal history of other complications of pregnancy, childbirth and the puerperium: Secondary | ICD-10-CM

## 2018-03-12 DIAGNOSIS — O0933 Supervision of pregnancy with insufficient antenatal care, third trimester: Secondary | ICD-10-CM

## 2018-03-12 DIAGNOSIS — O361930 Maternal care for other isoimmunization, third trimester, not applicable or unspecified: Secondary | ICD-10-CM

## 2018-03-12 DIAGNOSIS — O361990 Maternal care for other isoimmunization, unspecified trimester, not applicable or unspecified: Secondary | ICD-10-CM

## 2018-03-12 NOTE — Patient Instructions (Signed)

## 2018-03-12 NOTE — Progress Notes (Signed)
   PRENATAL VISIT NOTE  Subjective:  Sandra Matthews is a 22 y.o. [phone] at [redacted]w[redacted]d being seen today for ongoing prenatal care.  She is currently monitored for the following issues for this high-risk pregnancy and has History of preterm delivery, currently pregnant in second trimester; Late prenatal care; High risk pregnancy due to history of preterm labor, antepartum; History of preterm premature rupture of membranes (PPROM); Anti-M isoimmunization affecting pregnancy, antepartum; Poor fetal growth; Limited prenatal care in third trimester; Marijuana use; and GBS (group B Streptococcus carrier), +RV culture, currently pregnant on their problem list.  Patient reports no complaints.  Contractions: Not present. Vag. Bleeding: None.  Movement: Present. Denies leaking of fluid.   The following portions of the patient's history were reviewed and updated as appropriate: allergies, current medications, past family history, past medical history, past social history, past surgical history and problem list. Problem list updated.  Objective:   Vitals:   03/12/18 1010  BP: 117/73  Pulse: 85  Weight: 130 lb (59 kg)    Fetal Status: Fetal Heart Rate (bpm): 125   Movement: Present     General:  Alert, oriented and cooperative. Patient is in no acute distress.  Skin: Skin is warm and dry. No rash noted.   Cardiovascular: Normal heart rate noted  Respiratory: Normal respiratory effort, no problems with respiration noted  Abdomen: Soft, gravid, appropriate for gestational age.  Pain/Pressure: Present     Pelvic: Cervical exam performed        Extremities: Normal range of motion.  Edema: None  Mental Status: Normal mood and affect. Normal behavior. Normal judgment and thought content.   Assessment and Plan:  Pregnancy: G3P1102 at [redacted]w[redacted]d  1. Limited prenatal care in third trimester   2. Late prenatal care   3. History of preterm premature rupture of membranes (PPROM)   4. Anti-M  isoimmunization affecting pregnancy, antepartum, single or unspecified fetus - IOL at 39 weeks scheduled  5. High risk pregnancy due to history of preterm labor, antepartum   Term labor symptoms and general obstetric precautions including but not limited to vaginal bleeding, contractions, leaking of fluid and fetal movement were reviewed in detail with the patient. Please refer to After Visit Summary for other counseling recommendations.  No follow-ups on file.  No future appointments.  Allie Bossier, MD

## 2018-03-12 NOTE — Progress Notes (Signed)
Pt scheduled for direct admit on 5/28 @ 8am.

## 2018-03-13 ENCOUNTER — Telehealth: Payer: Self-pay

## 2018-03-13 ENCOUNTER — Telehealth: Payer: Self-pay | Admitting: Obstetrics and Gynecology

## 2018-03-13 NOTE — Telephone Encounter (Signed)
Per Rolitta, CNM pt needs to be informed of IOL on 03/17/18 @ 0800.    Attempted to contact pt, contact # states that pt can not be contacted at this time try call back later.  MyChart message sent.

## 2018-03-13 NOTE — Telephone Encounter (Signed)
TC to notify her of IOL date & time. Message on phone stated, "The person you are trying to call is not receiving calls at this time. Please try your call again later."  Raelyn Mora, CNM  03/13/2018 9:19 AM

## 2018-03-14 ENCOUNTER — Inpatient Hospital Stay (HOSPITAL_COMMUNITY)
Admission: AD | Admit: 2018-03-14 | Discharge: 2018-03-14 | Disposition: A | Discharge status: Home / Self Care | Payer: Medicaid Other | Source: Ambulatory Visit | Attending: Obstetrics & Gynecology | Admitting: Obstetrics & Gynecology

## 2018-03-14 ENCOUNTER — Encounter (HOSPITAL_COMMUNITY): Payer: Self-pay

## 2018-03-14 ENCOUNTER — Encounter (HOSPITAL_COMMUNITY): Payer: Self-pay | Admitting: Emergency Medicine

## 2018-03-14 ENCOUNTER — Inpatient Hospital Stay (HOSPITAL_COMMUNITY)
Admission: AD | Admit: 2018-03-14 | Discharge: 2018-03-14 | Disposition: A | Discharge status: Home / Self Care | Payer: Medicaid Other | Source: Ambulatory Visit | Attending: Obstetrics and Gynecology | Admitting: Obstetrics and Gynecology

## 2018-03-14 ENCOUNTER — Other Ambulatory Visit: Payer: Self-pay

## 2018-03-14 DIAGNOSIS — O99820 Streptococcus B carrier state complicating pregnancy: Secondary | ICD-10-CM

## 2018-03-14 DIAGNOSIS — F1290 Cannabis use, unspecified, uncomplicated: Secondary | ICD-10-CM

## 2018-03-14 DIAGNOSIS — O0933 Supervision of pregnancy with insufficient antenatal care, third trimester: Secondary | ICD-10-CM

## 2018-03-14 DIAGNOSIS — O471 False labor at or after 37 completed weeks of gestation: Secondary | ICD-10-CM | POA: Diagnosis present

## 2018-03-14 DIAGNOSIS — Z3A39 39 weeks gestation of pregnancy: Secondary | ICD-10-CM | POA: Diagnosis not present

## 2018-03-14 DIAGNOSIS — O479 False labor, unspecified: Secondary | ICD-10-CM

## 2018-03-14 HISTORY — DX: Depression, unspecified: F32.A

## 2018-03-14 HISTORY — DX: Anemia, unspecified: D64.9

## 2018-03-14 HISTORY — DX: Major depressive disorder, single episode, unspecified: F32.9

## 2018-03-14 NOTE — MAU Note (Signed)
Was seen earlier today for labor ck and sent home. Returns now with stronger ctxs, No LOF or bleeding

## 2018-03-14 NOTE — MAU Note (Signed)
Pt. Reports ctx every 5-10 minutes. States a pain level of 7 during a contraction. Denies leaking and bleeding, feels fetal movement.

## 2018-03-14 NOTE — MAU Note (Signed)
I have communicated with Artist Pais and reviewed vital signs:  Vitals:   03/14/18 2055  BP: 102/67  Pulse: (!) 117  Resp: 18  Temp: 98.4 F (36.9 C)    Vaginal exam:  Dilation: 4 Effacement (%): 80 Cervical Position: Posterior Station: Ballotable Presentation: Vertex Exam by:: Judithe Modest, RN ,   Also reviewed contraction pattern and that non-stress test is reactive.  It has been documented that patient is contracting irregularly with no cervical change since she was in MAU earlier today,  not indicating active labor.  Patient denies any other complaints.  Based on this report provider has given order for discharge.  A discharge order and diagnosis entered by a provider.   Labor discharge instructions reviewed with patient.

## 2018-03-14 NOTE — MAU Note (Addendum)
Woke up about 0230 with ctxs. Denies LOF or vag bleeding. 4cm last sve on Tues

## 2018-03-14 NOTE — Discharge Instructions (Signed)
Insomnia:  Benadryl (alcohol free)  every 6 hours as needed  Tylenol PM  Unisom, no Gelcaps  Braxton Hicks Contractions Contractions of the uterus can occur throughout pregnancy, but they are not always a sign that you are in labor. You may have practice contractions called Braxton Hicks contractions. These false labor contractions are sometimes confused with true labor. What are Sandra Matthews contractions? Braxton Hicks contractions are tightening movements that occur in the muscles of the uterus before labor. Unlike true labor contractions, these contractions do not result in opening (dilation) and thinning of the cervix. Toward the end of pregnancy (32-34 weeks), Braxton Hicks contractions can happen more often and may become stronger. These contractions are sometimes difficult to tell apart from true labor because they can be very uncomfortable. You should not feel embarrassed if you go to the hospital with false labor. Sometimes, the only way to tell if you are in true labor is for your health care provider to look for changes in the cervix. The health care provider will do a physical exam and may monitor your contractions. If you are not in true labor, the exam should show that your cervix is not dilating and your water has not broken. If there are other health problems associated with your pregnancy, it is completely safe for you to be sent home with false labor. You may continue to have Braxton Hicks contractions until you go into true labor. How to tell the difference between true labor and false labor True labor  Contractions last 30-70 seconds.  Contractions become very regular.  Discomfort is usually felt in the top of the uterus, and it spreads to the lower abdomen and low back.  Contractions do not go away with walking.  Contractions usually become more intense and increase in frequency.  The cervix dilates and gets thinner. False labor  Contractions are usually shorter  and not as strong as true labor contractions.  Contractions are usually irregular.  Contractions are often felt in the front of the lower abdomen and in the groin.  Contractions may go away when you walk around or change positions while lying down.  Contractions get weaker and are shorter-lasting as time goes on.  The cervix usually does not dilate or become thin. Follow these instructions at home:  Take over-the-counter and prescription medicines only as told by your health care provider.  Keep up with your usual exercises and follow other instructions from your health care provider.  Eat and drink lightly if you think you are going into labor.  If Braxton Hicks contractions are making you uncomfortable: ? Change your position from lying down or resting to walking, or change from walking to resting. ? Sit and rest in a tub of warm water. ? Drink enough fluid to keep your urine pale yellow. Dehydration may cause these contractions. ? Do slow and deep breathing several times an hour.  Keep all follow-up prenatal visits as told by your health care provider. This is important. Contact a health care provider if:  You have a fever.  You have continuous pain in your abdomen. Get help right away if:  Your contractions become stronger, more regular, and closer together.  You have fluid leaking or gushing from your vagina.  You pass blood-tinged mucus (bloody show).  You have bleeding from your vagina.  You have low back pain that you never had before.  You feel your babys head pushing down and causing pelvic pressure.  Your baby is not  moving inside you as much as it used to. Summary  Contractions that occur before labor are called Braxton Hicks contractions, false labor, or practice contractions.  Braxton Hicks contractions are usually shorter, weaker, farther apart, and less regular than true labor contractions. True labor contractions usually become progressively  stronger and regular and they become more frequent.  Manage discomfort from [facility] contractions by changing position, resting in a warm bath, drinking plenty of water, or practicing deep breathing. This information is not intended to replace advice given to you by your health care provider. Make sure you discuss any questions you have with your health care provider. Document Released: 02/20/2017 Document Revised: 02/20/2017 Document Reviewed: 02/20/2017 Elsevier Interactive Patient Education  2018 [employer].

## 2018-03-17 ENCOUNTER — Inpatient Hospital Stay (HOSPITAL_COMMUNITY)
Admission: RE | Admit: 2018-03-17 | Discharge: 2018-03-19 | DRG: 806 | Disposition: A | Discharge status: Home / Self Care | Payer: Medicaid Other | Source: Ambulatory Visit | Attending: Family Medicine | Admitting: Family Medicine

## 2018-03-17 ENCOUNTER — Other Ambulatory Visit: Payer: Self-pay

## 2018-03-17 ENCOUNTER — Encounter (HOSPITAL_COMMUNITY): Payer: Self-pay

## 2018-03-17 DIAGNOSIS — O365990 Maternal care for other known or suspected poor fetal growth, unspecified trimester, not applicable or unspecified: Secondary | ICD-10-CM | POA: Diagnosis present

## 2018-03-17 DIAGNOSIS — O99324 Drug use complicating childbirth: Secondary | ICD-10-CM | POA: Diagnosis present

## 2018-03-17 DIAGNOSIS — O864 Pyrexia of unknown origin following delivery: Secondary | ICD-10-CM | POA: Diagnosis not present

## 2018-03-17 DIAGNOSIS — O99820 Streptococcus B carrier state complicating pregnancy: Secondary | ICD-10-CM

## 2018-03-17 DIAGNOSIS — O0933 Supervision of pregnancy with insufficient antenatal care, third trimester: Secondary | ICD-10-CM

## 2018-03-17 DIAGNOSIS — O99824 Streptococcus B carrier state complicating childbirth: Secondary | ICD-10-CM | POA: Diagnosis present

## 2018-03-17 DIAGNOSIS — O365930 Maternal care for other known or suspected poor fetal growth, third trimester, not applicable or unspecified: Secondary | ICD-10-CM | POA: Diagnosis present

## 2018-03-17 DIAGNOSIS — F1290 Cannabis use, unspecified, uncomplicated: Secondary | ICD-10-CM | POA: Diagnosis present

## 2018-03-17 DIAGNOSIS — Z3A39 39 weeks gestation of pregnancy: Secondary | ICD-10-CM

## 2018-03-17 LAB — RAPID URINE DRUG SCREEN, HOSP PERFORMED
AMPHETAMINES: NOT DETECTED
Barbiturates: NOT DETECTED
Benzodiazepines: NOT DETECTED
Cocaine: NOT DETECTED
OPIATES: NOT DETECTED
Tetrahydrocannabinol: POSITIVE — AB

## 2018-03-17 LAB — CBC
HEMATOCRIT: 31.7 % — AB (ref 36.0–46.0)
HEMOGLOBIN: 10.5 g/dL — AB (ref 12.0–15.0)
MCH: 30.5 pg (ref 26.0–34.0)
MCHC: 33.1 g/dL (ref 30.0–36.0)
MCV: 92.2 fL (ref 78.0–100.0)
Platelets: 355 10*3/uL (ref 150–400)
RBC: 3.44 MIL/uL — ABNORMAL LOW (ref 3.87–5.11)
RDW: 14.1 % (ref 11.5–15.5)
WBC: 20 10*3/uL — ABNORMAL HIGH (ref 4.0–10.5)

## 2018-03-17 LAB — RPR: RPR Ser Ql: NONREACTIVE

## 2018-03-17 MED ORDER — ACETAMINOPHEN 325 MG PO TABS
650.0000 mg | ORAL_TABLET | ORAL | Status: DC | PRN
  Administered 2018-03-18 (×3): 650 mg via ORAL
  Filled 2018-03-17 (×3): qty 2

## 2018-03-17 MED ORDER — OXYCODONE-ACETAMINOPHEN 5-325 MG PO TABS
1.0000 | ORAL_TABLET | ORAL | Status: DC | PRN
  Administered 2018-03-18 – 2018-03-19 (×2): 1 via ORAL
  Filled 2018-03-17 (×2): qty 1

## 2018-03-17 MED ORDER — BENZOCAINE-MENTHOL 20-0.5 % EX AERO: 1.0000 "application " | INHALATION_SPRAY | CUTANEOUS | Status: DC | PRN

## 2018-03-17 MED ORDER — FENTANYL CITRATE (PF) 100 MCG/2ML IJ SOLN
100.0000 ug | INTRAMUSCULAR | Status: DC | PRN
  Administered 2018-03-17 (×4): 100 ug via INTRAVENOUS
  Filled 2018-03-17 (×4): qty 2

## 2018-03-17 MED ORDER — OXYCODONE-ACETAMINOPHEN 5-325 MG PO TABS: 2.0000 | ORAL_TABLET | ORAL | Status: DC | PRN

## 2018-03-17 MED ORDER — SIMETHICONE 80 MG PO CHEW: 80.0000 mg | CHEWABLE_TABLET | ORAL | Status: DC | PRN

## 2018-03-17 MED ORDER — IBUPROFEN 600 MG PO TABS: 600.0000 mg | ORAL_TABLET | Freq: Four times a day (QID) | ORAL | Status: DC

## 2018-03-17 MED ORDER — DIPHENHYDRAMINE HCL 25 MG PO CAPS: 25.0000 mg | ORAL_CAPSULE | Freq: Four times a day (QID) | ORAL | Status: DC | PRN

## 2018-03-17 MED ORDER — OXYTOCIN BOLUS FROM INFUSION
500.0000 mL | Freq: Once | INTRAVENOUS | Status: AC
  Administered 2018-03-17: 500 mL via INTRAVENOUS

## 2018-03-17 MED ORDER — LIDOCAINE HCL (PF) 1 % IJ SOLN
30.0000 mL | INTRAMUSCULAR | Status: DC | PRN
  Filled 2018-03-17: qty 30

## 2018-03-17 MED ORDER — MEASLES, MUMPS & RUBELLA VAC ~~LOC~~ INJ: 0.5000 mL | INJECTION | Freq: Once | SUBCUTANEOUS | Status: DC

## 2018-03-17 MED ORDER — TETANUS-DIPHTH-ACELL PERTUSSIS 5-2.5-18.5 LF-MCG/0.5 IM SUSP: 0.5000 mL | Freq: Once | INTRAMUSCULAR | Status: DC

## 2018-03-17 MED ORDER — COCONUT OIL OIL: 1.0000 "application " | TOPICAL_OIL | Status: DC | PRN

## 2018-03-17 MED ORDER — OXYTOCIN 40 UNITS IN LACTATED RINGERS INFUSION - SIMPLE MED
1.0000 m[IU]/min | INTRAVENOUS | Status: DC
  Administered 2018-03-17: 2 m[IU]/min via INTRAVENOUS
  Filled 2018-03-17: qty 1000

## 2018-03-17 MED ORDER — PENICILLIN G POTASSIUM 5000000 UNITS IJ SOLR
5.0000 10*6.[IU] | Freq: Once | INTRAMUSCULAR | Status: AC
  Administered 2018-03-17: 5 10*6.[IU] via INTRAVENOUS
  Filled 2018-03-17: qty 5

## 2018-03-17 MED ORDER — ONDANSETRON HCL 4 MG/2ML IJ SOLN: 4.0000 mg | INTRAMUSCULAR | Status: DC | PRN

## 2018-03-17 MED ORDER — FLEET ENEMA 7-19 GM/118ML RE ENEM: 1.0000 | ENEMA | RECTAL | Status: DC | PRN

## 2018-03-17 MED ORDER — LACTATED RINGERS IV SOLN: 500.0000 mL | INTRAVENOUS | Status: DC | PRN

## 2018-03-17 MED ORDER — TERBUTALINE SULFATE 1 MG/ML IJ SOLN
0.2500 mg | Freq: Once | INTRAMUSCULAR | Status: DC | PRN
  Filled 2018-03-17: qty 1

## 2018-03-17 MED ORDER — OXYTOCIN 40 UNITS IN LACTATED RINGERS INFUSION - SIMPLE MED: 2.5000 [IU]/h | INTRAVENOUS | Status: DC

## 2018-03-17 MED ORDER — IBUPROFEN 600 MG PO TABS
600.0000 mg | ORAL_TABLET | Freq: Four times a day (QID) | ORAL | Status: DC
  Administered 2018-03-17 – 2018-03-19 (×7): 600 mg via ORAL
  Filled 2018-03-17 (×8): qty 1

## 2018-03-17 MED ORDER — MISOPROSTOL 200 MCG PO TABS
ORAL_TABLET | ORAL | Status: AC
  Administered 2018-03-17: 1000 ug
  Filled 2018-03-17: qty 5

## 2018-03-17 MED ORDER — ONDANSETRON HCL 4 MG PO TABS: 4.0000 mg | ORAL_TABLET | ORAL | Status: DC | PRN

## 2018-03-17 MED ORDER — OXYCODONE-ACETAMINOPHEN 5-325 MG PO TABS
2.0000 | ORAL_TABLET | ORAL | Status: DC | PRN
  Administered 2018-03-18 (×2): 2 via ORAL
  Filled 2018-03-17 (×3): qty 2

## 2018-03-17 MED ORDER — ONDANSETRON HCL 4 MG/2ML IJ SOLN
4.0000 mg | Freq: Four times a day (QID) | INTRAMUSCULAR | Status: DC | PRN
  Administered 2018-03-17: 4 mg via INTRAVENOUS
  Filled 2018-03-17: qty 2

## 2018-03-17 MED ORDER — LACTATED RINGERS IV SOLN
INTRAVENOUS | Status: DC
  Administered 2018-03-17 (×2): via INTRAVENOUS

## 2018-03-17 MED ORDER — SOD CITRATE-CITRIC ACID 500-334 MG/5ML PO SOLN: 30.0000 mL | ORAL | Status: DC | PRN

## 2018-03-17 MED ORDER — ACETAMINOPHEN 325 MG PO TABS: 650.0000 mg | ORAL_TABLET | ORAL | Status: DC | PRN

## 2018-03-17 MED ORDER — OXYCODONE-ACETAMINOPHEN 5-325 MG PO TABS: 1.0000 | ORAL_TABLET | ORAL | Status: DC | PRN

## 2018-03-17 MED ORDER — PRENATAL MULTIVITAMIN CH
1.0000 | ORAL_TABLET | Freq: Every day | ORAL | Status: DC
  Administered 2018-03-18 – 2018-03-19 (×2): 1 via ORAL
  Filled 2018-03-17 (×2): qty 1

## 2018-03-17 MED ORDER — MISOPROSTOL 200 MCG PO TABS: 1000.0000 ug | ORAL_TABLET | Freq: Once | ORAL | Status: DC

## 2018-03-17 MED ORDER — PENICILLIN G POT IN DEXTROSE 60000 UNIT/ML IV SOLN
3.0000 10*6.[IU] | INTRAVENOUS | Status: DC
  Administered 2018-03-17 (×2): 3 10*6.[IU] via INTRAVENOUS
  Filled 2018-03-17 (×4): qty 50

## 2018-03-17 NOTE — H&P (Signed)
Sandra Matthews is a 22 y.o. female [phone] at [redacted]w[redacted]d presenting for IOL for IUGR. OB History    Gravida  3   Para  2   Term  1   Preterm  1   AB  0   Living  2     SAB  0   TAB  0   Ectopic  0   Multiple  0   Live Births  2          Past Medical History:  Diagnosis Date  . Anemia   . Depression   . No pertinent past medical history   . Preterm labor    Past Surgical History:  Procedure Laterality Date  . FRACTURE SURGERY     broken arm in childhood, cast only, no surgery   Family History: family history includes Anxiety disorder in her father; Bipolar disorder in her father; Depression in her father. Social History:  reports that she has never smoked. She has never used smokeless tobacco. She reports that she has current or past drug history. Drug: Marijuana. She reports that she does not drink alcohol. Clinic   [facility] Prenatal Labs  Dating    6/014/2019  Blood type:   o pos  Genetic Screen  late prenatal care Antibody: positive  Anatomic [place]  normal but less than 3rd percentile for AC Rubella:  imm  GTT  Third trimester: 1 hour GTT normal RPR:   neg  Flu vaccine  HBsAg:   neg  TDaP vaccine   02/17/18                                     HIV:   neg  Baby Food  Breast feed                                         GBS:  positive  Contraception  Depo shot  Pap: neg 11/2017  Circumcision  yes outpatient   Pediatrician  [facility] CF: neg  Support Person    Fayrene Fearing (fob) SMA: reduced risk  Prenatal Classes  Hgb electrophoresis: normal       Maternal Diabetes: No Genetic Screening: Declined Maternal Ultrasounds/Referrals: Normal, less than 3rd percentile for AC Fetal Ultrasounds or other Referrals:  None Maternal Substance Abuse:  Yes:  Type: Marijuana Significant Maternal Medications:  None Significant Maternal Lab Results:  None, GBS positive Other Comments:  None  Review of Systems  Constitutional: Negative for chills and fever.   HENT: Negative for congestion, sinus pain, sore throat and tinnitus.   Eyes: Negative for blurred vision and double vision.  Respiratory: Negative for cough, shortness of breath and wheezing.   Cardiovascular: Negative for chest pain, palpitations, claudication and leg swelling.  Gastrointestinal: Negative for nausea and vomiting.  Skin: Negative for itching and rash.  Neurological: Negative for dizziness, tingling, tremors, loss of consciousness, weakness and headaches.  Psychiatric/Behavioral: Negative for depression, hallucinations, memory loss, substance abuse and suicidal ideas. The patient is not nervous/anxious.    Maternal Medical History:  Reason for admission: Nausea.    Dilation: 4 Effacement (%): 70 Station: -3 Exam by:: Lorn Junes, RN Blood pressure 107/66, pulse 97, temperature 98.7 F (37.1 C), temperature source Oral, resp. rate 16, height  (1.575 m), weight 132 lb 6.4  oz (60.1 kg), last menstrual period 06/17/2017, unknown if currently breastfeeding. Maternal Exam:  Uterine Assessment: Contraction strength is mild.  Contraction duration is 80 seconds. Contraction frequency is irregular.   Abdomen: Patient reports no abdominal tenderness. Introitus: Ferning test: not done.  Nitrazine test: not done. Amniotic fluid character: not assessed.  Cervix: not evaluated.   Fetal Exam Fetal Monitor Review: Mode: ultrasound.   Baseline rate: 125.  Variability: moderate (6-25 bpm).   Pattern: accelerations present and no decelerations.    Fetal State Assessment: Category I - tracings are normal.     Physical Exam  Constitutional: She is oriented to person, place, and time. She appears well-developed and well-nourished.  HENT:  Head: Normocephalic and atraumatic.  Neck: Normal range of motion. Neck supple.  Cardiovascular: Normal rate, regular rhythm, normal heart sounds and intact distal pulses.  Respiratory: Effort normal and breath sounds normal.  GI:   Gravid  Musculoskeletal: Normal range of motion.  Neurological: She is alert and oriented to person, place, and time. She has normal reflexes.  Skin: Skin is warm and dry.  Psychiatric: She has a normal mood and affect. Her behavior is normal. Judgment and thought content normal.    Prenatal labs: ABO, Rh: --/--/O POS (05/28 0818) Antibody: POS (05/28 0818) Rubella: 1.76 (02/27 1409) RPR: Non Reactive (04/30 1009)  HBsAg: Negative (02/27 1409)  HIV: Non Reactive (04/30 1009)  GBS: Positive (04/30 1002)   Assessment/Plan: - 21 y.o. G3P1102 at [redacted]w[redacted]d  - 4cm by previous exam, not in labor - Augment with Pitocin PRN, currently infusing at 19mL/hour - GBS positive, PCN ordered - desires unmedicated delivery - UDS pending, hx THC - boy/both/outpatient circ, considering BTL   Calvert Cantor 03/17/2018, 9:42 AM

## 2018-03-17 NOTE — Progress Notes (Signed)
Sandra Matthews is a 22 y.o. [phone] at [redacted]w[redacted]d by ultrasound admitted for induction of labor due to IUGR.  Subjective: Reports painful contraction 8/10, declines anesthesia/analgesia at this time  Objective: BP 106/62   Pulse 93   Temp 98.7 F (37.1 C) (Oral)   Resp 18   Ht  (1.575 m)   Wt 132 lb 6.4 oz (60.1 kg)   LMP 06/17/2017 (Approximate)   BMI 24.22 kg/m  No intake/output data recorded. No intake/output data recorded.  FHT:  FHR: 125 bpm, variability: moderate,  accelerations:  Present,  decelerations:  Absent UC:   regular, every 2-3 minutes SVE:   Dilation: 4 Effacement (%): 70 Station: -3 Exam by:: Lorn Junes, RN  Labs: Lab Results  Component Value Date   WBC 20.0 (H) 03/17/2018   HGB 10.5 (L) 03/17/2018   HCT 31.7 (L) 03/17/2018   MCV 92.2 03/17/2018   PLT 355 03/17/2018    Assessment / Plan: Induction of labor due to IUGR,  progressing well on pitocin  Labor: Progressing normally Preeclampsia:  N/A Fetal Wellbeing:  Category I Pain Control:  Labor support without medications I/D:  n/a Anticipated MOD:  NSVD  Calvert Cantor, CNM 03/17/2018, 1:09 PM

## 2018-03-17 NOTE — Progress Notes (Signed)
Sandra Matthews is a 22 y.o. [phone] at [redacted]w[redacted]d by ultrasound admitted for induction of labor due to IUGR.  Subjective: Coping well with contractions, "doing ok" with IV analgesia  Objective: BP 105/65   Pulse (!) 102   Temp 98 F (36.7 C) (Axillary)   Resp 18   Ht  (1.575 m)   Wt 132 lb 6.4 oz (60.1 kg)   LMP 06/17/2017 (Approximate)   BMI 24.22 kg/m  No intake/output data recorded. No intake/output data recorded.  FHT:  FHR: 120 bpm, variability: moderate,  accelerations:  Present,  decelerations:  Present early and variable UC:   regular, every 2-3 minutes SVE:   Dilation: 4 Effacement (%): 70 Station: ballotable Exam by:Sam, CNM   Labs: Lab Results  Component Value Date   WBC 20.0 (H) 03/17/2018   HGB 10.5 (L) 03/17/2018   HCT 31.7 (L) 03/17/2018   MCV 92.2 03/17/2018   PLT 355 03/17/2018    Assessment / Plan: - Cervix unchanged from previous exam - Baby not well applied, pt repositioned to tailor sitting - Now Category 1Baseline 120, mod variability, positive accelerations, no decels - Plan to AROM next check Anticipated MOD:  NSVD  Calvert Cantor, CNM 03/17/2018, 5:05 PM

## 2018-03-17 NOTE — Progress Notes (Signed)
   03/17/18 2335 03/17/18 2336 03/17/18 2337  Vital Signs  Temp (!) 100.5 F (38.1 C) (!) 101.1 F (38.4 C) (!) 102.5 F (39.2 C)  Temp Source Oral Axillary Oral   Geraldine Solar, nurse midwife, was called about elevated temperatures. She stated to give Tylenol and then reassess temperature in an hour.

## 2018-03-17 NOTE — Anesthesia Pain Management Evaluation Note (Signed)
  CRNA Pain Management Visit Note  Patient: Sandra Matthews, 22 y.o., female  "Hello I am a member of the anesthesia team at [facility]. We have an anesthesia team available at all times to provide care throughout the hospital, including epidural management and anesthesia for C-section. I don't know your plan for the delivery whether it a natural birth, water birth, IV sedation, nitrous supplementation, doula or epidural, but we want to meet your pain goals."   1.Was your pain managed to your expectations on prior hospitalizations?   Yes   2.What is your expectation for pain management during this hospitalization?     IV pain meds  3.How can we help you reach that goal? Be available  Record the patient's initial score and the patient's pain goal.   Pain: 6  Pain Goal: 7 [facility] wants you to be able to say your pain was always managed very well.  [facility] 03/17/2018

## 2018-03-18 NOTE — Progress Notes (Signed)
   03/18/18 1139  Edinburgh Postnatal Depression Scale:  In the Past 7 Days  I have been able to laugh and see the funny side of things. 0  I have looked forward with enjoyment to things. 0  I have blamed myself unnecessarily when things went wrong. 2  I have been anxious or worried for no good reason. 2  I have felt scared or panicky for no good reason. 1  Things have been getting on top of me. 1  I have been so unhappy that I have had difficulty sleeping. 0  I have felt sad or miserable. 1  I have been so unhappy that I have been crying. 1  The thought of harming myself has occurred to me. 1  Edinburgh Postnatal Depression Scale Total 9  Lulu Riding LCSW just left room; pt sated she discussed her PPD with previous child and has been provided resources for this postpartum period.

## 2018-03-18 NOTE — Clinical Social Work Maternal (Signed)
CLINICAL SOCIAL WORK MATERNAL/CHILD NOTE  Patient Details  Name: Sandra Matthews MRN: [id number] Date of Birth: 12/23/1995  Date:  03/18/2018  Clinical Social Worker Initiating Note:  Jaeven Wanzer, LCSW Date/Time: Initiated:  03/18/18/1100     Child's Name:  Sandra Matthews   Biological Parents:  Mother, Father(Sandra Matthews and Sandra Matthews)   Need for Interpreter:  None   Reason for Referral:  Current Substance Use/Substance Use During Pregnancy (Marijuana use.  MOB positive on admission.  Baby's UDS positive for THC.)   Address:  [address] [place] [address]    Phone number:  [phone] (home)     Additional phone number:  Household Members/Support Persons (HM/SP):   Household Member/Support Person 1, Household Member/Support Person 2, Household Member/Support Person 3   HM/SP Name Relationship DOB or Age  HM/SP -1 Sandra Matthews FOB 09/06/93  HM/SP -2 Sandra Matthews son 03/09/11  HM/SP -3 Sandra Matthews daughter 03/24/16  HM/SP -4        HM/SP -5        HM/SP -6        HM/SP -7        HM/SP -8          Natural Supports (not living in the home):  Parent, Extended Family, Immediate Family   Professional Supports: None   Employment:     Type of Work: FOB works in construction   Education:      Homebound arranged:    Financial Resources:  Medicaid   Other Resources:      Cultural/Religious Considerations Which May Impact Care: None stated.    Strengths:  Ability to meet basic needs , Home prepared for child , Pediatrician chosen(MOB states they are still trying to get a bassinet for baby.  CSW set her up on [email or web address] to obtain a baby box in the meantime.)   Psychotropic Medications:         Pediatrician:    [address] area  Pediatrician List:   [place] [facility]  [place]    [place]    [place]    [place]    [place]      Pediatrician Fax Number:     Risk Factors/Current Problems:  None, Mental Health Concerns (MOB states she has been feeling some passive SI in the past few months due to being overwhelmed.  She has not had any plans or intent and denies feelings now.  She is grieving the loss of her brother, but feels she is coping well with that at this time.)   Cognitive State:  Linear Thinking , Alert , Able to Concentrate , Insightful , Goal Oriented    Mood/Affect:  Interested , Calm    CSW Assessment: CSW met with MOB in her first floor room/139 to offer support and complete assessment for marijuana use in pregnancy.  MOB had a positive UDS for THC on 02/14/18 and admission on 03/17/18.  Baby's UDS is positive for THC.  MOB was quiet, but pleasant and welcoming of CSW's visit.  FOB and their two other children (ages 7 and 2) were present and CSW offered to return at a later time due to not feeling comfortable discussing all things that need to be discussed with 23 year old present.  MOB asked FOB to take the children out of the room so she could talk privately with CSW.  He handed baby to MOB and did so willingly.   MOB states labor   and delivery went well.  When asked how she is feeling about having a third baby, she was hesitant in her response, but overall positive.  She reports all three of her children are fathered by her current partner and that he is involved and supportive.  She states they live together with their children.  She states her mother, cousin, gramma and FOB's mother are all great supports to them.   CSW informed MOB that CSW did not want to talk about marijuana use or drug screening with her son in the room.  She thanked CSW and stated she was pretty sure this is what CSW was referring to when asking to speak with her privately.  MOB states she only smoked "every now and again for my appetite."  She states this happened with her daughter also and is aware of what to expect from CPS involvement.  She states her  daughter's CDS was positive and CPS worker came out to the home.  She states she does not smoke marijuana outside of pregnancy.  She denies all other drug use.   CSW discussed how she is feeling emotionally and provided education regarding PMADs.  CSW notes that MOB met with [facility] at [facility] in March and it was noted that her brother passed away a few months prior to that.  CSW asked how she feels she is coping with her grief at this time and if she has ever been diagnosed with depression.  MOB states depression runs on her dad's side of the family, but that she has never been given a diagnosis of depression.  She confirmed that she is grieving her brother's passing, but feels she is coping well at this time.  She does not think she needs counseling and states she has outpatient resources that [facility] provided her with.  MOB scored a 9 on the [place] Postnatal Depression Scale and CSW noted that she scored a 1 on question 10.  CSW spoke at length about SI-what is normal reaction to feeling overwhelmed and what is a crisis.  CSW asked that she commit to calling 911 or going to the emergency room if she ever has plan or intent to act on suicidal ideations.  MOB agreed and committed easily.  She denies feelings of wanting to harm herself currently.  She denies all emotional concerns currently, but was appreciative of CSW's concern for her emotional wellbeing and states she will talk with Roselyn Reef in the future if concerns arise.  CSW also provided her with a New Mom Checklist from [employer] as an additional way to monitor her mental health.  She denies symptoms of PMADs after her first two deliveries. MOB is completing the videos and quiz for a baby box and will let the RN station know when she has successfully completed the test.  CSW Plan/Description:  No Further Intervention Required/No Barriers to Discharge, Sudden Infant Death Syndrome (SIDS) Education, Perinatal Mood and Anxiety Disorder  (PMADs) Education, [place], Child Protective Service Report , Other Information/Referral to [employer], CSW Will Continue to Monitor Umbilical Cord Tissue Drug Screen Results and Make Report if Waldron Session, [place] 2018-06-28, 1:49 PM

## 2018-03-18 NOTE — Progress Notes (Addendum)
POSTPARTUM PROGRESS NOTE  Post Partum Day 1  Subjective:  Sandra Matthews is a 22 y.o. [phone] s/p SVD at [redacted]w[redacted]d.  She reports she is doing well.  She denies any problems with ambulating, voiding or po intake. Denies nausea or vomiting.  Pain is well controlled.  Lochia is nml.  Overnight patient had a fever to 102.5 that improved after receiving Tylenol.  She has been afebrile since 1:15am.  Objective: Blood pressure 108/62, pulse 71, temperature 99.2 F (37.3 C), temperature source Oral, resp. rate 18, height  (1.575 m), weight 60.1 kg (132 lb 6.4 oz), last menstrual period 06/17/2017, SpO2 100 %, unknown if currently breastfeeding.  Physical Exam:  General: alert, cooperative and no distress Chest: no respiratory distress Heart:regular rate, distal pulses intact Abdomen: soft, nontender,  Uterine Fundus: firm, appropriately tender DVT Evaluation: No calf swelling or tenderness Extremities: no edema Skin: warm, dry  Recent Labs    03/17/18 0818  HGB 10.5*  HCT 31.7*    Assessment/Plan: Sandra Matthews is a 22 y.o. [phone] s/p SVD at 102w0d   PPD#1 - Doing well     Routine postpartum care     SW consult for +THC on UDS  Contraception: POP, would like tubal ligation but patient reports was denied due to age Feeding: Breast & Bottle Feeding Dispo: Plan for discharge 03/19/2018.   LOS: 1 day   Alfonso Ellis Medical Student 03/18/2018, 7:50 AM

## 2018-03-18 NOTE — Lactation Note (Signed)
This note was copied from a baby's chart. Lactation Consultation Note Baby 9 hrs old. Mom states has BF well a couple of times.  Mom has BF an older child for 1 yr. Mom has soft tubular breast w/everted nipples. Mom hand expressed colostrum into colostrum container to spoon feed baby. Reviewed supplementing and feeding according to LPI information sheet. Encouraged mom to give colostrum first before formula. Asked RN to set up DEBP. Encouraged to pump every 3 hrs.   Attempted to give Similac 22 cal via slow flow nipple. Baby had no sucking interest. Has sensitive gag reflex. W/gloved finger attempted suck training.  Attempted spoon feeding formula, cheek massage. Baby occasionally sip from spoon, then gag when formula reached back of mouth. Baby took 4 ml, spit some out. Strict I&O encouraged. STS, supply and demand educated. Mom encouraged to feed baby 8-12 times/24 hours and with feeding cues. If baby hasn't cued in 2 1/2 - 3 hrs stimulate to feed. Alert RN if baby isn't feeding well, has missed a feeding, jitteriness increases. Encouraged to call for assistance in latching.  WH/LC brochure given w/resources, support groups and LC services.  Patient Name: Sandra Matthews [id number] Date: 03/18/2018 Reason for consult: Initial assessment;Term;Infant < 6lbs   Maternal Data Has patient been taught Hand Expression?: Yes Does the patient have breastfeeding experience prior to this delivery?: Yes  Feeding Feeding Type: Formula Nipple Type: Slow - flow  LATCH Score Latch: Too sleepy or reluctant, no latch achieved, no sucking elicited.  Audible Swallowing: None  Type of Nipple: Everted at rest and after stimulation  Comfort (Breast/Nipple): Soft / non-tender        Interventions Interventions: Breast feeding basics reviewed;Hand express;Expressed milk;Breast compression(asked RN to set up DEBP)  Lactation Tools Discussed/Used WIC Program: Yes   Consult Status Consult  Status: Follow-up Date: 03/18/18 Follow-up type: In-patient    Sandra Matthews, Diamond Nickel 03/18/2018, 5:00 AM

## 2018-03-18 NOTE — Lactation Note (Signed)
This note was copied from a baby's chart. Lactation Consultation Note  Patient Name: Sandra Matthews RUEAV'W Date: 03/18/2018 Reason for consult: Follow-up assessment;Infant < 6lbs;Term  Visited with P3 Mom of term baby at 62 hrs old.  Baby 4 lbs. 6.4 oz today, weight loss of 2%.   Baby double swaddled sleeping in crib.   It had been over 3 hrs since baby fed.  Offered to demonstrate paced bottle feeding to Mom.   Baby took 8 ml of 22 cal formula.  Chin support given and baby was able to suck/swallow more smoothly.   Encouraged keeping baby STS on her chest as much as possible.  Plan- 1- Feed baby at least every 3 hrs, sooner if baby cues to eat. 2- Offer breast, asking for assistance with latch prn. 3- Supplement with 10-20 ml of EBM+/22 cal formula by paced bottle feeding 4- Pump both breasts for 15 mins using DEBP.  Breast massage and hand expression before and after.  Feed baby any EBM obtained first to baby, adding formula to equal 10-20 ml. 5- STS as much as possible 6- ask for help prn.  [facility] referral sent for DEBP at discharge.   Consult Status Consult Status: Follow-up Date: 03/19/18 Follow-up type: In-patient    Sandra Matthews 03/18/2018, 5:05 PM

## 2018-03-19 MED ORDER — IBUPROFEN 600 MG PO TABS: 600.0000 mg | ORAL_TABLET | Freq: Four times a day (QID) | ORAL | 0 refills | Status: DC

## 2018-03-19 MED ORDER — OXYCODONE-ACETAMINOPHEN 5-325 MG PO TABS: 1.0000 | ORAL_TABLET | Freq: Four times a day (QID) | ORAL | 0 refills | Status: DC | PRN

## 2018-03-19 NOTE — Discharge Summary (Addendum)
OB Discharge Summary     Patient Name: Sandra Matthews DOB: 1996/09/27 MRN: [id number]  Date of admission: 03/17/2018 Delivering MD: Calvert Cantor   Date of discharge: 03/19/2018  Admitting diagnosis: INDUCTION Intrauterine pregnancy: [redacted]w[redacted]d     Secondary diagnosis:  Principal Problem:   NSVD (normal spontaneous vaginal delivery) Active Problems:   GBS (group B Streptococcus carrier), +RV culture, currently pregnant   IUGR (intrauterine growth restriction) affecting care of mother  Additional problems:  Patient Active Problem List   Diagnosis Date Noted  . IUGR (intrauterine growth restriction) affecting care of mother 03/17/2018  . NSVD (normal spontaneous vaginal delivery) 03/17/2018  . GBS (group B Streptococcus carrier), +RV culture, currently pregnant 03/02/2018  . Limited prenatal care in third trimester 02/14/2018  . 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  . 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 Poor fetal growth 01/07/2018  . High risk pregnancy due to history of preterm labor, antepartum 12/17/2017  . History of preterm premature rupture of membranes (PPROM) 12/17/2017  . History of preterm delivery, currently pregnant in second trimester 11/18/2015  . Late prenatal care 11/18/2015       Discharge diagnosis: Term Pregnancy Delivered                                                                                                Post partum procedures:none  Augmentation: AROM and Pitocin  Complications: None  Hospital course:  Induction of Labor With Vaginal Delivery   22 y.o. yo [phone] at [redacted]w[redacted]d was admitted to the hospital 03/17/2018 for induction of labor.  Indication for induction: IUGR.  Patient had an uncomplicated labor course as follows: Membrane Rupture Time/Date: 5:50 PM ,03/17/2018   Intrapartum Procedures: Episiotomy: None [1]                                         Lacerations:  None [1]  Patient had delivery of a Viable  infant.  Information for the patient's newborn:  Yesly, Gerety [[id number]]  Delivery Method: Vaginal, Spontaneous(Filed from Delivery Summary)   03/17/2018  Details of delivery can be found in separate delivery note.  Patient had a routine postpartum course. Patient is discharged home 03/19/18.  Physical exam  Vitals:   03/18/18 0252 03/18/18 1140 03/18/18 1729 03/19/18 0534  BP: 108/62 110/73 108/68 (!) 100/58  Pulse: 71 72 70 65  Resp: Temp: 99.2 F (37.3 C) 97.9 F (36.6 C) 98 F (36.7 C) 98.2 F (36.8 C)  TempSrc: Oral  Oral Oral  SpO2:  100% 100% 100%  Weight:      Height:       General: alert, cooperative and no distress Lochia: appropriate Uterine Fundus: firm Incision: N/A DVT Evaluation: No evidence of DVT seen on physical exam. Labs: Lab Results  Component Value Date   WBC 20.0 (H) 03/17/2018   HGB 10.5 (L) 03/17/2018   HCT 31.7 (L) 03/17/2018   MCV 92.2 03/17/2018   PLT 355 03/17/2018  CMP Latest Ref Rng & Units 12/10/2017  Glucose 65 - 99 mg/dL 92  BUN 6 - 20 mg/dL 5(L)  Creatinine 4.09 - 1.00 mg/dL 8.11  Sodium 914 - 782 mmol/L 129(L)  Potassium 3.5 - 5.1 mmol/L 3.0(L)  Chloride 101 - 111 mmol/L 102  CO2 22 - 32 mmol/L 18(L)  Calcium 8.9 - 10.3 mg/dL 9.5(A)  Total Protein 6.5 - 8.1 g/dL 7.4  Total Bilirubin 0.3 - 1.2 mg/dL 0.8  Alkaline Phos 38 - 126 U/L 93  AST 15 - 41 U/L 14(L)  ALT 14 - 54 U/L 11(L)    Discharge instruction: per After Visit Summary and "Baby and Me Booklet".  After visit meds:  Allergies as of 03/19/2018   No Known Allergies     Medication List    STOP taking these medications   cyclobenzaprine 10 MG tablet Commonly known as:  FLEXERIL     TAKE these medications   ibuprofen 600 MG tablet Commonly known as:  ADVIL,MOTRIN Take 1 tablet (600 mg total) by mouth every 6 (six) hours.   oxyCODONE-acetaminophen 5-325 MG tablet Commonly known as:  PERCOCET/ROXICET Take 1 tablet by mouth every 6 (six)  hours as needed for moderate pain (pain scale 4-7).   PRENATAL VITAMIN PLUS LOW IRON 27-1 MG Tabs Take 1 tablet by mouth daily.       Diet: routine diet  Activity: Advance as tolerated. Pelvic rest for 6 weeks.   Outpatient follow up:4 weeks Follow up Appt:No future appointments. Follow up Visit: Follow-up Information    [facility] Follow up in 4 week(s).   Specialty:  Obstetrics and Gynecology Contact information: [address] [place] [address] [phone]          Postpartum contraception: Progesterone only pills  Newborn Data: Live born female  Birth Weight: 4 lb 8.3 oz (2050 g) APGAR: 9, 9  Newborn Delivery   Birth date/time:  03/17/2018 19:47:00 Delivery type:  Vaginal, Spontaneous     Baby Feeding: Bottle and Breast Disposition:home with mother   03/19/2018 Felisa Bonier, MD, PGY-1 Family Medicine - [facility]  OB FELLOW DISCHARGE ATTESTATION  I have seen and examined this patient. I agree with above documentation and have made edits as needed.   Caryl Ada, DO OB Fellow 2:36 PM

## 2018-03-19 NOTE — Lactation Note (Signed)
This note was copied from a baby's chart. Lactation Consultation Note  Patient Name: [name] [id number] Date: 03/19/2018  Reviewed feeding plan with mom.  She is putting baby to breast and hand expressing but not pumping.  Baby is supplemented with 10-20 mls of formula but not consistently. Discussed importance of pumping and supplementing every 3 hours.  Mom can easily hand express milk.  Encouraged to call with concerns or assist prn.   Maternal Data    Feeding Feeding Type: Formula Nipple Type: Slow - flow  LATCH Score                   Interventions    Lactation Tools Discussed/Used     Consult Status      Huston Foley 03/19/2018, 11:34 AM

## 2018-03-20 ENCOUNTER — Ambulatory Visit: Payer: Self-pay

## 2018-03-20 NOTE — Lactation Note (Addendum)
This note was copied from a baby's chart. Lactation Consultation Note  Patient Name: Sandra Matthews    Mom says infant is breastfeeding well. Mom reports it only takes 5 minute for infant to drink 15mL from bottle. She reports infant is not interested in consuming more.   Mom reports having exclusively breast fed her last child once her milk came to volume. She reports having had a good supply.   Mom has my # to call to assess next feeding.  [name], [name] [name], 8:08 AM

## 2018-03-20 NOTE — Lactation Note (Signed)
This note was copied from a baby's chart. Lactation Consultation Note  Patient Name: Sandra Matthews Date: 03/20/2018   Infant is nursing very well (swallows verified by cervical auscultation).   Mom used the DEBP about 3 times yesterday & not yet today (as of 0800). Mom says that pumping hurts a little bit. Her flange size was checked & coconut oil was provided for lubrication for pumping. Mom's nipple diameter suggests that she is a size 24 flange, which is what she has been using. Mom was counseled not to turn the suction up as high as possible. I requested that Mom pump about 4 times/day (to give Mom an achievable goal) so she can use EBM instead of formula for supplementation.   Mom was shown how to assemble & use hand pump (single & double mode) that was included in pump kit.  Infant has gained 0.4 oz.   Matthias Hughs [facility] 03/20/2018, 10:53 AM

## 2018-03-21 ENCOUNTER — Ambulatory Visit: Payer: Self-pay

## 2018-03-21 LAB — TYPE AND SCREEN
ABO/RH(D): O POS
Antibody Screen: POSITIVE
Donor AG Type: NEGATIVE
Donor AG Type: NEGATIVE
UNIT DIVISION: 0
Unit division: 0

## 2018-03-21 LAB — BPAM RBC
Blood Product Expiration Date: 201906232359
Blood Product Expiration Date: 201906272359
UNIT TYPE AND RH: 5100
Unit Type and Rh: 5100

## 2018-03-21 NOTE — Lactation Note (Signed)
This note was copied from a baby's chart. Lactation Consultation Note  Patient Name: Sandra Matthews [id number]'[id number]'s Date: 03/21/2018 Reason for consult: Follow-up assessment;Infant < 6lbs Breasts are full this morning.  Mom reports baby continues to latch easily and softens breast during feeding.  She pumped 10 mls her last pumping and gave expressed milk to baby.  Reviewed engorgement treatment.  Mom is comfortable with manual pump and [facility]  Lactation outpatient services and support reviewed and encouraged prn.  Maternal Data    Feeding    LATCH Score                   Interventions    Lactation Tools Discussed/Used     Consult Status      Huston FoleyMOULDEN, Tamyka Bezio S 03/21/2018, 10:06 AM

## 2018-04-29 ENCOUNTER — Ambulatory Visit: Payer: Medicaid Other | Admitting: Advanced Practice Midwife

## 2018-05-11 ENCOUNTER — Ambulatory Visit: Payer: Self-pay | Admitting: Advanced Practice Midwife

## 2019-10-22 NOTE — L&D Delivery Note (Addendum)
OB/GYN Faculty Practice Delivery Note  Sandra Matthews is a 24 y.o. [phone] s/p spontaneous vaginal at [redacted]w[redacted]d. She was admitted for Contractions, Rupture of Membranes, and Decreased Fetal Movement .   GBS Status: Positive/-- (10/01 1147) Maximum Maternal Temperature: Temp (24hrs), Avg:97.5 F (36.4 C), Min:97.5 F (36.4 C), Max:97.5 F (36.4 C)  Labor Progress: Admitted in SOL AROM 0h 28m prior to delivery with light mec fluid  Complete dilation achieved.   Delivery Date/Time: 07/30/2020 at 0959 Delivery: Called to room and patient was complete and pushing. Head delivered ROA.  Nuchal cord present x 1 and delivered through. Shoulder and body delivered in usual fashion. Infant with spontaneous cry, placed skin to skin on mother's abdomen, dried and stimulated. Cord clamped x 2 after 1-minute delay, and cut by MOB. Cord blood drawn. Placenta delivered spontaneously with gentle cord traction. Fundus firm with massage and Pitocin. Labia, perineum, vagina, and cervix inspected with  right periurethral laceration that did not require repair  .   Placenta: spontaneous , intact  Complications: none EBL: 75 mL  Analgesia: none  Postpartum Planning Mom and baby to mother/baby.  Lactation consult Contraception - depo  Circ desires   Social work: mood disorder   Infant: Viable baby boy   APGAR: 8/9   see delivery summary for infant weight  Genia Hotter, M.D.  07/30/2020 10:32 AM  I attest that I was gowned and gloved for the delivery  of infant and placenta and I agree in the above documentation and findings.   Sandra Matthews

## 2019-12-02 ENCOUNTER — Other Ambulatory Visit: Payer: Self-pay

## 2019-12-02 ENCOUNTER — Inpatient Hospital Stay (HOSPITAL_COMMUNITY): Payer: Medicaid Other

## 2019-12-02 ENCOUNTER — Inpatient Hospital Stay (HOSPITAL_COMMUNITY)
Admission: AD | Admit: 2019-12-02 | Discharge: 2019-12-02 | Discharge status: Against Medical Advice | Payer: Medicaid Other | Attending: Obstetrics and Gynecology | Admitting: Obstetrics and Gynecology

## 2019-12-02 ENCOUNTER — Encounter (HOSPITAL_COMMUNITY): Payer: Self-pay | Admitting: Obstetrics and Gynecology

## 2019-12-02 DIAGNOSIS — Z5329 Procedure and treatment not carried out because of patient's decision for other reasons: Secondary | ICD-10-CM | POA: Diagnosis not present

## 2019-12-02 DIAGNOSIS — R109 Unspecified abdominal pain: Secondary | ICD-10-CM | POA: Insufficient documentation

## 2019-12-02 DIAGNOSIS — O3680X0 Pregnancy with inconclusive fetal viability, not applicable or unspecified: Secondary | ICD-10-CM

## 2019-12-02 DIAGNOSIS — O26891 Other specified pregnancy related conditions, first trimester: Secondary | ICD-10-CM | POA: Diagnosis not present

## 2019-12-02 DIAGNOSIS — Z3A01 Less than 8 weeks gestation of pregnancy: Secondary | ICD-10-CM | POA: Diagnosis not present

## 2019-12-02 LAB — CBC
HCT: 35.2 % — ABNORMAL LOW (ref 36.0–46.0)
Hemoglobin: 11.9 g/dL — ABNORMAL LOW (ref 12.0–15.0)
MCH: 30.6 pg (ref 26.0–34.0)
MCHC: 33.8 g/dL (ref 30.0–36.0)
MCV: 90.5 fL (ref 80.0–100.0)
Platelets: 432 10*3/uL — ABNORMAL HIGH (ref 150–400)
RBC: 3.89 MIL/uL (ref 3.87–5.11)
RDW: 11.9 % (ref 11.5–15.5)
WBC: 8.7 10*3/uL (ref 4.0–10.5)
nRBC: 0 % (ref 0.0–0.2)

## 2019-12-02 LAB — URINALYSIS, ROUTINE W REFLEX MICROSCOPIC
Bilirubin Urine: NEGATIVE
Glucose, UA: NEGATIVE mg/dL
Hgb urine dipstick: NEGATIVE
Ketones, ur: NEGATIVE mg/dL
Nitrite: POSITIVE — AB
Protein, ur: 30 mg/dL — AB
Specific Gravity, Urine: 1.031 — ABNORMAL HIGH (ref 1.005–1.030)
pH: 5 (ref 5.0–8.0)

## 2019-12-02 LAB — WET PREP, GENITAL
Clue Cells Wet Prep HPF POC: NONE SEEN
Sperm: NONE SEEN
Trich, Wet Prep: NONE SEEN
Yeast Wet Prep HPF POC: NONE SEEN

## 2019-12-02 LAB — HCG, QUANTITATIVE, PREGNANCY: hCG, Beta Chain, Quant, S: 283 m[IU]/mL — ABNORMAL HIGH (ref <5)

## 2019-12-02 LAB — POCT PREGNANCY, URINE: Preg Test, Ur: POSITIVE — AB
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
## 2019-12-02 NOTE — Progress Notes (Signed)
Ultrasound called stated they were ready for patient. Called patient in the lobby, registration stated that she went to [place].

## 2019-12-02 NOTE — Progress Notes (Signed)
Pt not in lobby or @ [facility] as previously informed.

## 2019-12-02 NOTE — Progress Notes (Addendum)
Pt left prior to completion of visit, considered AMA.

## 2019-12-02 NOTE — MAU Note (Signed)
Presents with c/o abdominal cramping, no VB.  +HPT.  LMP 11/07/2019.

## 2019-12-03 LAB — GC/CHLAMYDIA PROBE AMP (~~LOC~~) NOT AT ARMC
Chlamydia: NEGATIVE
Comment: NEGATIVE
Comment: NORMAL
Neisseria Gonorrhea: NEGATIVE

## 2019-12-04 ENCOUNTER — Other Ambulatory Visit: Payer: Self-pay | Admitting: Student

## 2019-12-04 DIAGNOSIS — O2341 Unspecified infection of urinary tract in pregnancy, first trimester: Secondary | ICD-10-CM

## 2019-12-04 LAB — CULTURE, OB URINE: Culture: >=100000 — AB

## 2019-12-04 MED ORDER — CEPHALEXIN 500 MG PO CAPS: 500.0000 mg | ORAL_CAPSULE | Freq: Four times a day (QID) | ORAL | 0 refills | Status: DC

## 2019-12-16 ENCOUNTER — Inpatient Hospital Stay (HOSPITAL_COMMUNITY): Payer: Medicaid Other

## 2019-12-16 ENCOUNTER — Inpatient Hospital Stay (HOSPITAL_COMMUNITY)
Admission: AD | Admit: 2019-12-16 | Discharge: 2019-12-16 | Disposition: A | Discharge status: Home / Self Care | Payer: Medicaid Other | Attending: Obstetrics and Gynecology | Admitting: Obstetrics and Gynecology

## 2019-12-16 ENCOUNTER — Other Ambulatory Visit: Payer: Self-pay

## 2019-12-16 ENCOUNTER — Encounter (HOSPITAL_COMMUNITY): Payer: Self-pay | Admitting: Obstetrics and Gynecology

## 2019-12-16 DIAGNOSIS — O2341 Unspecified infection of urinary tract in pregnancy, first trimester: Secondary | ICD-10-CM

## 2019-12-16 DIAGNOSIS — B9620 Unspecified Escherichia coli [E. coli] as the cause of diseases classified elsewhere: Secondary | ICD-10-CM | POA: Insufficient documentation

## 2019-12-16 DIAGNOSIS — R1030 Lower abdominal pain, unspecified: Secondary | ICD-10-CM | POA: Diagnosis not present

## 2019-12-16 DIAGNOSIS — R110 Nausea: Secondary | ICD-10-CM | POA: Insufficient documentation

## 2019-12-16 DIAGNOSIS — O26891 Other specified pregnancy related conditions, first trimester: Secondary | ICD-10-CM | POA: Insufficient documentation

## 2019-12-16 DIAGNOSIS — O26899 Other specified pregnancy related conditions, unspecified trimester: Secondary | ICD-10-CM

## 2019-12-16 DIAGNOSIS — Z3A01 Less than 8 weeks gestation of pregnancy: Secondary | ICD-10-CM | POA: Diagnosis not present

## 2019-12-16 LAB — CBC
HCT: 34.2 % — ABNORMAL LOW (ref 36.0–46.0)
Hemoglobin: 11.5 g/dL — ABNORMAL LOW (ref 12.0–15.0)
MCH: 30.2 pg (ref 26.0–34.0)
MCHC: 33.6 g/dL (ref 30.0–36.0)
MCV: 89.8 fL (ref 80.0–100.0)
Platelets: 426 10*3/uL — ABNORMAL HIGH (ref 150–400)
RBC: 3.81 MIL/uL — ABNORMAL LOW (ref 3.87–5.11)
RDW: 12.1 % (ref 11.5–15.5)
WBC: 9.2 10*3/uL (ref 4.0–10.5)
nRBC: 0 % (ref 0.0–0.2)

## 2019-12-16 LAB — URINALYSIS, ROUTINE W REFLEX MICROSCOPIC
Bilirubin Urine: NEGATIVE
Glucose, UA: NEGATIVE mg/dL
Hgb urine dipstick: NEGATIVE
Ketones, ur: 80 mg/dL — AB
Leukocytes,Ua: NEGATIVE
Nitrite: NEGATIVE
Protein, ur: 30 mg/dL — AB
Specific Gravity, Urine: 1.028 (ref 1.005–1.030)
pH: 5 (ref 5.0–8.0)

## 2019-12-16 LAB — HCG, QUANTITATIVE, PREGNANCY: hCG, Beta Chain, Quant, S: 15971 m[IU]/mL — ABNORMAL HIGH (ref <5)

## 2019-12-16 NOTE — Discharge Instructions (Signed)
Pregnancy and Urinary Tract Infection ° °A urinary tract infection (UTI) is an infection of any part of the urinary tract. This includes the kidneys, the tubes that connect your kidneys to your bladder (ureters), the bladder, and the tube that carries urine out of your body (urethra). These organs make, store, and get rid of urine in the body. Your health care provider may use other names to describe the infection. An upper UTI affects the ureters and kidneys (pyelonephritis). A lower UTI affects the bladder (cystitis) and urethra (urethritis). °Most urinary tract infections are caused by bacteria in your genital area, around the entrance to your urinary tract (urethra). These bacteria grow and cause irritation and inflammation of your urinary tract. You are more likely to develop a UTI during pregnancy because the physical and hormonal changes your body goes through can make it easier for bacteria to get into your urinary tract. Your growing baby also puts pressure on your bladder and can affect urine flow. It is important to recognize and treat UTIs in pregnancy because of the risk of serious complications for both you and your baby. °How does this affect me? °Symptoms of a UTI include: °· Needing to urinate right away (urgently). °· Frequent urination or passing small amounts of urine frequently. °· Pain or burning with urination. °· Blood in the urine. °· Urine that smells bad or unusual. °· Trouble urinating. °· Cloudy urine. °· Pain in the abdomen or lower back. °· Vaginal discharge. °You may also have: °· Vomiting or a decreased appetite. °· Confusion. °· Irritability or tiredness. °· A fever. °· Diarrhea. °How does this affect my baby? °An untreated UTI during pregnancy could lead to a kidney infection or a systemic infection, which can cause health problems that could affect your baby. Possible complications of an untreated UTI include: °· Giving birth to your baby before 37 weeks of pregnancy  (premature). °· Having a baby with a low birth weight. °· Developing high blood pressure during pregnancy (preeclampsia). °· Having a low hemoglobin level (anemia). °What can I do to lower my risk? °To prevent a UTI: °· Go to the bathroom as soon as you feel the need. Do not hold urine for long periods of time. °· Always wipe from front to back, especially after a bowel movement. Use each tissue one time when you wipe. °· Empty your bladder after sex. °· Keep your genital area dry. °· Drink 6-10 glasses of water each day. °· Do not douche or use deodorant sprays. °How is this treated? °Treatment for this condition may include: °· Antibiotic medicines that are safe to take during pregnancy. °· Other medicines to treat less common causes of UTI. °Follow these instructions at home: °· If you were prescribed an antibiotic medicine, take it as told by your health care provider. Do not stop using the antibiotic even if you start to feel better. °· Keep all follow-up visits as told by your health care provider. This is important. °Contact a health care provider if: °· Your symptoms do not improve or they get worse. °· You have abnormal vaginal discharge. °Get help right away if you: °· Have a fever. °· Have nausea and vomiting. °· Have back or side pain. °· Feel contractions in your uterus. °· Have lower belly pain. °· Have a gush of fluid from your vagina. °· Have blood in your urine. °Summary °· A urinary tract infection (UTI) is an infection of any part of the urinary tract, which includes the   kidneys, ureters, bladder, and urethra. °· Most urinary tract infections are caused by bacteria in your genital area, around the entrance to your urinary tract (urethra). °· You are more likely to develop a UTI during pregnancy. °· If you were prescribed an antibiotic medicine, take it as told by your health care provider. Do not stop using the antibiotic even if you start to feel better. °This information is not intended to  replace advice given to you by your health care provider. Make sure you discuss any questions you have with your health care provider. °Document Revised: 01/29/2019 Document Reviewed: 09/10/2018 °Elsevier Patient Education © 2020 [employer] ° °

## 2019-12-16 NOTE — MAU Note (Signed)
Pt reporting abdominal pain for 2 days, which might be related to "crying a lot." She also started having diarrhea today. She also reports a lot of stress in her life so she hasn't been eating much. Some intermittent n/v.  Denies bleeding or any sharp abdominal pain. She is here to "get her baby checked out."
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
## 2019-12-28 ENCOUNTER — Institutional Professional Consult (permissible substitution): Payer: Self-pay | Admitting: Licensed Clinical Social Worker

## 2020-01-17 ENCOUNTER — Ambulatory Visit (INDEPENDENT_AMBULATORY_CARE_PROVIDER_SITE_OTHER): Payer: Self-pay

## 2020-01-17 DIAGNOSIS — O0990 Supervision of high risk pregnancy, unspecified, unspecified trimester: Secondary | ICD-10-CM | POA: Insufficient documentation

## 2020-01-17 MED ORDER — BLOOD PRESSURE KIT DEVI: 1.0000 | 0 refills | Status: DC

## 2020-01-17 NOTE — Progress Notes (Signed)
I connected with  [name] on 01/17/20 by a video enabled telemedicine application and verified that I am speaking with the correct person using two identifiers.   I discussed the limitations of evaluation and management by telemedicine. The patient expressed understanding and agreed to proceed.   PRENATAL INTAKE SUMMARY  Ms. Horstman presents today New OB Nurse Interview.  OB History    Gravida  4   Para  3   Term  2   Preterm  1   AB  0   Living  3     SAB  0   TAB  0   Ectopic  0   Multiple  0   Live Births  3          I have reviewed the patient's medical, obstetrical, social, and family histories, medications, and available lab results.  SUBJECTIVE She has no unusual complaints  OBJECTIVE Initial Physical Exam (New OB)  GENERAL APPEARANCE: alert, well appearing, oriented to person, place and time   ASSESSMENT Normal pregnancy PHQ-9=2  PLAN Prenatal care @ [facility] will be done NOB visit BP Cuff ordered BabyScripts explained and ordered New OB appt. 01/25/20 @ 1 pm with Joni Reining Nugent.

## 2020-01-25 ENCOUNTER — Encounter: Payer: Self-pay | Admitting: Women's Health

## 2020-01-31 ENCOUNTER — Encounter (HOSPITAL_COMMUNITY): Payer: Self-pay | Admitting: Obstetrics and Gynecology

## 2020-01-31 ENCOUNTER — Inpatient Hospital Stay (HOSPITAL_COMMUNITY)
Admission: AD | Admit: 2020-01-31 | Discharge: 2020-01-31 | Disposition: A | Discharge status: Home / Self Care | Payer: Medicaid Other | Attending: Obstetrics and Gynecology | Admitting: Obstetrics and Gynecology

## 2020-01-31 ENCOUNTER — Other Ambulatory Visit: Payer: Self-pay

## 2020-01-31 DIAGNOSIS — Z20828 Contact with and (suspected) exposure to other viral communicable diseases: Secondary | ICD-10-CM

## 2020-01-31 DIAGNOSIS — O99321 Drug use complicating pregnancy, first trimester: Secondary | ICD-10-CM

## 2020-01-31 DIAGNOSIS — O219 Vomiting of pregnancy, unspecified: Secondary | ICD-10-CM | POA: Diagnosis not present

## 2020-01-31 DIAGNOSIS — O210 Mild hyperemesis gravidarum: Secondary | ICD-10-CM | POA: Insufficient documentation

## 2020-01-31 DIAGNOSIS — Z3A12 12 weeks gestation of pregnancy: Secondary | ICD-10-CM | POA: Diagnosis not present

## 2020-01-31 DIAGNOSIS — F1290 Cannabis use, unspecified, uncomplicated: Secondary | ICD-10-CM | POA: Diagnosis not present

## 2020-01-31 DIAGNOSIS — O99891 Other specified diseases and conditions complicating pregnancy: Secondary | ICD-10-CM

## 2020-01-31 DIAGNOSIS — Z87891 Personal history of nicotine dependence: Secondary | ICD-10-CM | POA: Diagnosis not present

## 2020-01-31 DIAGNOSIS — R197 Diarrhea, unspecified: Secondary | ICD-10-CM

## 2020-01-31 LAB — RAPID URINE DRUG SCREEN, HOSP PERFORMED
Amphetamines: NOT DETECTED
Barbiturates: NOT DETECTED
Benzodiazepines: NOT DETECTED
Cocaine: NOT DETECTED
Opiates: NOT DETECTED
Tetrahydrocannabinol: POSITIVE — AB

## 2020-01-31 LAB — URINALYSIS, ROUTINE W REFLEX MICROSCOPIC
Bilirubin Urine: NEGATIVE
Glucose, UA: NEGATIVE mg/dL
Ketones, ur: NEGATIVE mg/dL
Nitrite: NEGATIVE
Protein, ur: 30 mg/dL — AB
Specific Gravity, Urine: 1.03 (ref 1.005–1.030)
pH: 5 (ref 5.0–8.0)

## 2020-01-31 MED ORDER — LOPERAMIDE HCL 2 MG PO CAPS
2.0000 mg | ORAL_CAPSULE | Freq: Once | ORAL | Status: AC
  Administered 2020-01-31: 09:00:00 2 mg via ORAL
  Filled 2020-01-31: qty 1

## 2020-01-31 MED ORDER — PROMETHAZINE HCL 25 MG PO TABS: 25.0000 mg | ORAL_TABLET | Freq: Four times a day (QID) | ORAL | 0 refills | Status: DC | PRN

## 2020-01-31 NOTE — MAU Note (Addendum)
Pt presents to MAU with c/o nausea/vomiting and loose stools. She has had 3 episodes of loose stools and 4 episodes of vomiting in the last 24 hours. She also is complaining of abdominal pain that presents in the morning for x 3 days. She does feel as if she is getting better but still is not feeling well. She also is complaining of painful urination x 3 days. Denies fever.

## 2020-01-31 NOTE — MAU Provider Note (Signed)
History     CSN: [id number]  Arrival date and time: 01/31/20 0746   First Provider Initiated Contact with Patient 01/31/20 [phone]      Chief Complaint  Patient presents with  . Abdominal Pain  . Emesis   HPI SHANEISHA BURKEL is a 24 y.o. [phone] at 56w1dwho presents to MAU with chief complaint nausea, vomiting and diarrhea, new onset three days ago. Patient states she is starting to feel better but experienced watery diarrhea and vomiting as recently as this morning. She also c/o mild abdominal cramping, onset and occurrence in conjuction with her GI complaints. She denies exposure to sick people, fever, dysuria, abdominal tenderness.  Patient is s/[place] [facility] [place] [facility] She states she missed her New OB appointment on 01/25/2020.  OB History    Gravida  4   Para  3   Term  2   Preterm  1   AB  0   Living  3     SAB  0   TAB  0   Ectopic  0   Multiple  0   Live Births  3           Past Medical History:  Diagnosis Date  . Anemia   . Depression   . Preterm labor     Past Surgical History:  Procedure Laterality Date  . FRACTURE SURGERY     broken arm in childhood, cast only, no surgery    Family History  Problem Relation Age of Onset  . Bipolar disorder Father   . Depression Father   . Anxiety disorder Father   . Arthritis Father   . Diabetes Maternal Grandmother   . Hypertension Maternal Grandfather   . Depression Paternal Grandmother     Social History   Tobacco Use  . Smoking status: Former Smoker    Types: Cigarettes    Quit date: 10/22/2019    Years since quitting: 0.2  . Smokeless tobacco: Never Used  Substance Use Topics  . Alcohol use: No  . Drug use: Yes    Types: Marijuana    Comment: Last use 01/30/2020    Allergies: No Known Allergies  Medications Prior to Admission  Medication Sig Dispense Refill Last Dose  . cephALEXin (KEFLEX) 500 MG capsule Take 1 capsule (500 mg total) by mouth 4 (four)  times daily. 28 capsule 0 Past Month at Unknown time  . Prenatal Vit-Fe Fumarate-FA (PRENATAL VITAMIN PLUS LOW IRON) 27-1 MG TABS Take 1 tablet by mouth daily. 30 tablet 11 01/30/2020 at Unknown time  . Blood Pressure Monitoring (BLOOD PRESSURE KIT) DEVI 1 kit by Does not apply route once a week. Check Blood Pressure regularly and record readings into the Babys[employer]s App.  Large Cuff.  DX O90.0 1 each 0 Unknown at Unknown time    Review of Systems  Constitutional: Negative for fatigue and fever.  Respiratory: Negative for shortness of breath.   Gastrointestinal: Positive for abdominal pain, diarrhea, nausea and vomiting.  Musculoskeletal: Negative for back pain.  Neurological: Negative for dizziness.  All other systems reviewed and are negative.  Physical Exam   Blood pressure 121/81, pulse 87, temperature 98.1 F (36.7 C), temperature source Oral, resp. rate 18, height '5\' 1"'  (1.549 m), weight 49.9 kg, last menstrual period 11/07/2019, SpO2 100 %, unknown if currently breastfeeding.  Physical Exam  Nursing note and vitals reviewed. Constitutional: She is oriented to person, place, and time. Vital signs are normal. She appears well-developed  and well-nourished. She is cooperative.  Non-toxic appearance. She does not have a sickly appearance. She does not appear ill. No distress.  HENT:  Mouth/Throat: Uvula is midline, oropharynx is clear and moist and mucous membranes are normal.  Eyes: Conjunctivae are normal.  Cardiovascular: Normal rate and normal heart sounds.  Respiratory: Effort normal and breath sounds normal.  GI: Soft. Bowel sounds are normal. She exhibits no distension. There is no abdominal tenderness. There is no rebound and no guarding.  Genitourinary:    Genitourinary Comments: Not evaluated due to chief complaint   Neurological: She is alert and oriented to person, place, and time.  Skin: Skin is warm and dry.  Psychiatric: She has a normal mood and affect. Her behavior is  normal. Judgment and thought content normal.    MAU Course/MDM  Procedures  --IUP confirmed via ultrasound 12/16/2019 --Discussed with patient that diarrhea is typically self-limiting, Imodium OTC as written is safe in pregnancy. Given list of diet choices to minimize episodes of diarrhea. --THC +. Abstinence encouraged. Discussed concern for fetal exposure, cannabis hyperemesis --Abnormal UA in setting of squam epithelial. Absence of patient urinary complaints. Will recheck urine at [facility] OB appt  Patient Vitals for the past 24 hrs:  BP Temp Temp src Pulse Resp SpO2 Height Weight  01/31/20 1009 116/76 -- -- -- -- -- -- --  01/31/20 0813 -- -- -- -- -- -- '5\' 1"'  (1.549 m) 49.9 kg  01/31/20 0808 121/81 98.1 F (36.7 C) Oral 87 18 100 % -- --   Results for orders placed or performed during the hospital encounter of 01/31/20 (from the past 24 hour(s))  Urinalysis, Routine w reflex microscopic     Status: Abnormal   Collection Time: 01/31/20  7:52 AM  Result Value Ref Range   Color, Urine YELLOW YELLOW   APPearance CLOUDY (A) CLEAR   Specific Gravity, Urine 1.030 1.005 - 1.030   pH 5.0 5.0 - 8.0   Glucose, UA NEGATIVE NEGATIVE mg/dL   Hgb urine dipstick SMALL (A) NEGATIVE   Bilirubin Urine NEGATIVE NEGATIVE   Ketones, ur NEGATIVE NEGATIVE mg/dL   Protein, ur 30 (A) NEGATIVE mg/dL   Nitrite NEGATIVE NEGATIVE   Leukocytes,Ua SMALL (A) NEGATIVE   RBC / HPF 0-5 0 - 5 RBC/hpf   WBC, UA 6-10 0 - 5 WBC/hpf   Bacteria, UA RARE (A) NONE SEEN   Squamous Epithelial / LPF 21-50 0 - 5   Mucus PRESENT   Rapid urine drug screen (hospital performed)     Status: Abnormal   Collection Time: 01/31/20  8:17 AM  Result Value Ref Range   Opiates NONE DETECTED NONE DETECTED   Cocaine NONE DETECTED NONE DETECTED   Benzodiazepines NONE DETECTED NONE DETECTED   Amphetamines NONE DETECTED NONE DETECTED   Tetrahydrocannabinol POSITIVE (A) NONE DETECTED   Barbiturates NONE DETECTED NONE DETECTED   Meds  ordered this encounter  Medications  . loperamide (IMODIUM) capsule 2 mg  . promethazine (PHENERGAN) 25 MG tablet    Sig: Take 1 tablet (25 mg total) by mouth every 6 (six) hours as needed for nausea or vomiting.    Dispense:  30 tablet    Refill:  0    Order Specific Question:   Supervising Provider    Answer:   Arlina Robes L [[id number]]   Assessment and Plan  --24 y.o. [id number] at [redacted]w[redacted]d --Possible viral exposure --THC + --Discharge home in stable condition  F/U: --Patient to schedule missed New OB  at [facility] --Message sent to [facility] staff to assist with scheduling  Darlina Rumpf, CNM 01/31/2020, 12:05 PM

## 2020-02-17 ENCOUNTER — Encounter: Payer: Self-pay | Admitting: Obstetrics and Gynecology

## 2020-03-02 ENCOUNTER — Telehealth: Payer: Self-pay | Admitting: Clinical

## 2020-03-02 ENCOUNTER — Other Ambulatory Visit: Payer: Self-pay

## 2020-03-02 ENCOUNTER — Inpatient Hospital Stay (HOSPITAL_COMMUNITY)
Admission: AD | Admit: 2020-03-02 | Discharge: 2020-03-02 | Disposition: A | Discharge status: Home / Self Care | Payer: Medicaid Other | Attending: Obstetrics and Gynecology | Admitting: Obstetrics and Gynecology

## 2020-03-02 ENCOUNTER — Encounter (HOSPITAL_COMMUNITY): Payer: Self-pay | Admitting: Obstetrics and Gynecology

## 2020-03-02 DIAGNOSIS — O219 Vomiting of pregnancy, unspecified: Secondary | ICD-10-CM

## 2020-03-02 DIAGNOSIS — O26892 Other specified pregnancy related conditions, second trimester: Secondary | ICD-10-CM

## 2020-03-02 DIAGNOSIS — Z87891 Personal history of nicotine dependence: Secondary | ICD-10-CM | POA: Insufficient documentation

## 2020-03-02 DIAGNOSIS — O09212 Supervision of pregnancy with history of pre-term labor, second trimester: Secondary | ICD-10-CM

## 2020-03-02 DIAGNOSIS — R197 Diarrhea, unspecified: Secondary | ICD-10-CM | POA: Diagnosis not present

## 2020-03-02 DIAGNOSIS — O09892 Supervision of other high risk pregnancies, second trimester: Secondary | ICD-10-CM

## 2020-03-02 DIAGNOSIS — R82998 Other abnormal findings in urine: Secondary | ICD-10-CM

## 2020-03-02 DIAGNOSIS — Z3A16 16 weeks gestation of pregnancy: Secondary | ICD-10-CM | POA: Insufficient documentation

## 2020-03-02 DIAGNOSIS — R102 Pelvic and perineal pain: Secondary | ICD-10-CM | POA: Insufficient documentation

## 2020-03-02 LAB — CBC WITH DIFFERENTIAL/PLATELET
Abs Immature Granulocytes: 0.1 10*3/uL — ABNORMAL HIGH (ref 0.00–0.07)
Basophils Absolute: 0 10*3/uL (ref 0.0–0.1)
Basophils Relative: 0 %
Eosinophils Absolute: 0 10*3/uL (ref 0.0–0.5)
Eosinophils Relative: 0 %
HCT: 32.8 % — ABNORMAL LOW (ref 36.0–46.0)
Hemoglobin: 10.9 g/dL — ABNORMAL LOW (ref 12.0–15.0)
Immature Granulocytes: 1 %
Lymphocytes Relative: 17 %
Lymphs Abs: 2 10*3/uL (ref 0.7–4.0)
MCH: 30.1 pg (ref 26.0–34.0)
MCHC: 33.2 g/dL (ref 30.0–36.0)
MCV: 90.6 fL (ref 80.0–100.0)
Monocytes Absolute: 0.8 10*3/uL (ref 0.1–1.0)
Monocytes Relative: 7 %
Neutro Abs: 8.5 10*3/uL — ABNORMAL HIGH (ref 1.7–7.7)
Neutrophils Relative %: 75 %
Platelets: 398 10*3/uL (ref 150–400)
RBC: 3.62 MIL/uL — ABNORMAL LOW (ref 3.87–5.11)
RDW: 12.1 % (ref 11.5–15.5)
WBC: 11.4 10*3/uL — ABNORMAL HIGH (ref 4.0–10.5)
nRBC: 0 % (ref 0.0–0.2)

## 2020-03-02 LAB — COMPREHENSIVE METABOLIC PANEL
ALT: 12 U/L (ref 0–44)
AST: 14 U/L — ABNORMAL LOW (ref 15–41)
Albumin: 3.1 g/dL — ABNORMAL LOW (ref 3.5–5.0)
Alkaline Phosphatase: 50 U/L (ref 38–126)
Anion gap: 9 (ref 5–15)
BUN: 5 mg/dL — ABNORMAL LOW (ref 6–20)
CO2: 22 mmol/L (ref 22–32)
Calcium: 8.9 mg/dL (ref 8.9–10.3)
Chloride: 103 mmol/L (ref 98–111)
Creatinine, Ser: 0.5 mg/dL (ref 0.44–1.00)
GFR calc Af Amer: >60 mL/min (ref >60)
GFR calc non Af Amer: >60 mL/min (ref >60)
Glucose, Bld: 84 mg/dL (ref 70–99)
Potassium: 3.4 mmol/L — ABNORMAL LOW (ref 3.5–5.1)
Sodium: 134 mmol/L — ABNORMAL LOW (ref 135–145)
Total Bilirubin: 0.7 mg/dL (ref 0.3–1.2)
Total Protein: 6.5 g/dL (ref 6.5–8.1)

## 2020-03-02 LAB — URINALYSIS, ROUTINE W REFLEX MICROSCOPIC
Bilirubin Urine: NEGATIVE
Glucose, UA: NEGATIVE mg/dL
Ketones, ur: 80 mg/dL — AB
Nitrite: NEGATIVE
Protein, ur: 100 mg/dL — AB
Specific Gravity, Urine: 1.029 (ref 1.005–1.030)
pH: 5 (ref 5.0–8.0)

## 2020-03-02 LAB — LIPASE, BLOOD: Lipase: 27 U/L (ref 11–51)

## 2020-03-02 MED ORDER — ONDANSETRON 4 MG PO TBDP
4.0000 mg | ORAL_TABLET | Freq: Four times a day (QID) | ORAL | 0 refills | Status: DC | PRN
Start: 2020-03-02 — End: 2020-04-26

## 2020-03-02 MED ORDER — SIMETHICONE 80 MG PO CHEW
80.0000 mg | CHEWABLE_TABLET | Freq: Once | ORAL | Status: AC
  Administered 2020-03-02: 80 mg via ORAL
  Filled 2020-03-02: qty 1

## 2020-03-02 MED ORDER — HYOSCYAMINE SULFATE 0.125 MG SL SUBL
0.1250 mg | SUBLINGUAL_TABLET | Freq: Once | SUBLINGUAL | Status: AC
  Administered 2020-03-02: 0.125 mg via SUBLINGUAL
  Filled 2020-03-02: qty 1

## 2020-03-02 MED ORDER — NITROFURANTOIN MONOHYD MACRO 100 MG PO CAPS
100.0000 mg | ORAL_CAPSULE | Freq: Two times a day (BID) | ORAL | 1 refills | Status: DC
Start: 2020-03-02 — End: 2020-05-06

## 2020-03-02 NOTE — MAU Note (Signed)
Pt presents to MAU c/o abdominal pain that hurts when she walks. Pt states the pain started yesterday. Pt denies bleeding or LOF. +FM. Pt reports some vomiting x3 times yesterday none today.

## 2020-03-02 NOTE — MAU Provider Note (Signed)
Chief Complaint:  Abdominal Pain   First Provider Initiated Contact with Patient 03/02/20 [phone]     HPI: Sandra Matthews is a 24 y.o. [phone] at 73w4dho presents to maternity admissions reporting lower abdominal pain since yesterday.  Also has had nausea and vomiting, which has been better since last night.  Able to keep things down now.  Took Phenergan yesterday.  Has had intermittent diarhhea, 3x/day.  History of short cervix requiring Cerclage..Marland Kitchen Has not had it done yet because she missed two New OB appointments. States has been stressed.  Tearful. Would not elaborate.  She reports good fetal movement, denies LOF, vaginal bleeding, vaginal itching/burning, urinary symptoms, h/a, dizziness, n/v, diarrhea, constipation or fever/chills.  She denies headache, visual changes or RUQ abdominal pain.  Abdominal Pain This is a new problem. The current episode started yesterday. The onset quality is gradual. The problem occurs intermittently. The problem has been unchanged. The pain is located in the suprapubic region. The quality of the pain is cramping. The abdominal pain does not radiate. Associated symptoms include anorexia, diarrhea, nausea and vomiting. Pertinent negatives include no constipation, dysuria, fever, frequency, headaches or myalgias. Nothing aggravates the pain. The pain is relieved by nothing. She has tried nothing for the symptoms.    RN Note: Pt presents to MAU c/o abdominal pain that hurts when she walks. Pt states the pain started yesterday. Pt denies bleeding or LOF. +FM. Pt reports some vomiting x3 times yesterday none today  Past Medical History: Past Medical History:  Diagnosis Date  . Anemia   . Depression   . Preterm labor     Past obstetric history: OB History  Gravida Para Term Preterm AB Living  _0 0 3  SAB TAB Ectopic Multiple Live Births  0 0 0 0 3    # Outcome Date GA Lbr Len/2nd Weight Sex Delivery Anes PTL Lv  4 Current           3 Term  03/17/18 340w0d6:15 / 00:02 2050 g M Vag-Spont None  LIV  2 Term 03/24/16 3864w1d:33 / 00:04 2825 g F Vag-Spont None  LIV  1 Preterm 03/09/11 30w6w0d71 g M Vag-Spont None Y LIV    Past Surgical History: Past Surgical History:  Procedure Laterality Date  . FRACTURE SURGERY     broken arm in childhood, cast only, no surgery    Family History: Family History  Problem Relation Age of Onset  . Bipolar disorder Father   . Depression Father   . Anxiety disorder Father   . Arthritis Father   . Diabetes Maternal Grandmother   . Hypertension Maternal Grandfather   . Depression Paternal Grandmother     Social History: Social History   Tobacco Use  . Smoking status: Former Smoker    Types: Cigarettes    Quit date: 10/22/2019    Years since quitting: 0.3  . Smokeless tobacco: Never Used  Substance Use Topics  . Alcohol use: No  . Drug use: Not Currently    Types: Marijuana    Comment: Last use 01/30/2020    Allergies: No Known Allergies  Meds:  Medications Prior to Admission  Medication Sig Dispense Refill Last Dose  . Prenatal Vit-Fe Fumarate-FA (MULTIVITAMIN-PRENATAL) 27-0.8 MG TABS tablet Take 1 tablet by mouth daily at 12 noon.   Past Week at Unknown time  . promethazine (PHENERGAN) 25 MG tablet Take 1 tablet (25 mg total) by mouth every 6 (six) hours as  needed for nausea or vomiting. 30 tablet 0 03/01/2020 at Unknown time  . Blood Pressure Monitoring (BLOOD PRESSURE KIT) DEVI 1 kit by Does not apply route once a week. Check Blood Pressure regularly and record readings into the Babyscripts App.  Large Cuff.  DX O90.0 1 each 0 Unknown at Unknown time    I have reviewed patient's Past Medical Hx, Surgical Hx, Family Hx, Social Hx, medications and allergies.   ROS:  Review of Systems  Constitutional: Negative for fever.  Gastrointestinal: Positive for abdominal pain, anorexia, diarrhea, nausea and vomiting. Negative for constipation.  Genitourinary: Negative for dysuria  and frequency.  Musculoskeletal: Negative for myalgias.  Neurological: Negative for headaches.   Other systems negative  Physical Exam   Patient Vitals for the past 24 hrs:  BP Temp Temp src Pulse Resp Weight  03/02/20 0603 115/69 98.3 F (36.8 C) Oral 90 16 50.5 kg   Constitutional: Well-developed, well-nourished female in no acute distress.  Cardiovascular: normal rate and rhythm Respiratory: normal effort, clear to auscultation bilaterally GI: Abd soft, non-tender except over suprapubic area which is mildly tender.  No rebound or guarding. MS: Extremities nontender, no edema, normal ROM Neurologic: Alert and oriented x 4.  GU: Neg CVAT.  PELVIC EXAM:   Cervix closed/long/high  FHT:  142   Labs: Results for orders placed or performed during the hospital encounter of 03/02/20 (from the past 24 hour(s))  CBC with Differential/Platelet     Status: Abnormal   Collection Time: 03/02/20  6:37 AM  Result Value Ref Range   WBC 11.4 (H) 4.0 - 10.5 K/uL   RBC 3.62 (L) 3.87 - 5.11 MIL/uL   Hemoglobin 10.9 (L) 12.0 - 15.0 g/dL   HCT 32.8 (L) 36.0 - 46.0 %   MCV 90.6 80.0 - 100.0 fL   MCH 30.1 26.0 - 34.0 pg   MCHC 33.2 30.0 - 36.0 g/dL   RDW 12.1 11.5 - 15.5 %   Platelets 398 150 - 400 K/uL   nRBC 0.0 0.0 - 0.2 %   Neutrophils Relative % 75 %   Neutro Abs 8.5 (H) 1.7 - 7.7 K/uL   Lymphocytes Relative 17 %   Lymphs Abs 2.0 0.7 - 4.0 K/uL   Monocytes Relative 7 %   Monocytes Absolute 0.8 0.1 - 1.0 K/uL   Eosinophils Relative 0 %   Eosinophils Absolute 0.0 0.0 - 0.5 K/uL   Basophils Relative 0 %   Basophils Absolute 0.0 0.0 - 0.1 K/uL   Immature Granulocytes 1 %   Abs Immature Granulocytes 0.10 (H) 0.00 - 0.07 K/uL  Comprehensive metabolic panel     Status: Abnormal   Collection Time: 03/02/20  6:37 AM  Result Value Ref Range   Sodium 134 (L) 135 - 145 mmol/L   Potassium 3.4 (L) 3.5 - 5.1 mmol/L   Chloride 103 98 - 111 mmol/L   CO2 22 22 - 32 mmol/L   Glucose, Bld 84 70  - 99 mg/dL   BUN 5 (L) 6 - 20 mg/dL   Creatinine, Ser 0.50 0.44 - 1.00 mg/dL   Calcium 8.9 8.9 - 10.3 mg/dL   Total Protein 6.5 6.5 - 8.1 g/dL   Albumin 3.1 (L) 3.5 - 5.0 g/dL   AST 14 (L) 15 - 41 U/L   ALT 12 0 - 44 U/L   Alkaline Phosphatase 50 38 - 126 U/L   Total Bilirubin 0.7 0.3 - 1.2 mg/dL   GFR calc non Af Amer >60 >60  mL/min   GFR calc Af Amer >60 >60 mL/min   Anion gap 9 5 - 15  Lipase, blood     Status: None   Collection Time: 03/02/20  6:37 AM  Result Value Ref Range   Lipase 27 11 - 51 U/L  Urinalysis, Routine w reflex microscopic     Status: Abnormal   Collection Time: 03/02/20  6:50 AM  Result Value Ref Range   Color, Urine YELLOW YELLOW   APPearance HAZY (A) CLEAR   Specific Gravity, Urine 1.029 1.005 - 1.030   pH 5.0 5.0 - 8.0   Glucose, UA NEGATIVE NEGATIVE mg/dL   Hgb urine dipstick SMALL (A) NEGATIVE   Bilirubin Urine NEGATIVE NEGATIVE   Ketones, ur 80 (A) NEGATIVE mg/dL   Protein, ur 100 (A) NEGATIVE mg/dL   Nitrite NEGATIVE NEGATIVE   Leukocytes,Ua MODERATE (A) NEGATIVE   RBC / HPF 0-5 0 - 5 RBC/hpf   WBC, UA 6-10 0 - 5 WBC/hpf   Bacteria, UA RARE (A) NONE SEEN   Squamous Epithelial / LPF 21-50 0 - 5   Mucus PRESENT      Imaging:  No results found.  MAU Course/MDM: I have ordered labs and reviewed results. Ordered CBC, diff, CMET, and Lipase to evaluate for conditions such as appendicitis or gallbladder disease.  Labs normal but UA is suggestive of possible UTI  WIll prescribe Macrobid and culture urine Rx zofran for nausea to augment Phenergan.  Treatments in MAU included Levsin and Simethicone which did help pelvic pain.    Assessment: Single IUP at 37w4dPelvic pain, presumed UTI History of preterm delivery at 335w6dollowed by 2 term deliveries Nausea and vomiting, intermittent Intermittent diarrhea, ?IBS Social stress (would not discuss)  Plan: .Discharge home Rx Zofran for nausea Rx Macrobid for presumed UTI Preterm labor  precautions Encouraged to keep next new OB appt.  [facility]  Encouraged to return here or to other Urgent Care/ED if she develops worsening of symptoms, increase in pain, fever, or other concerning symptoms.     MaHansel FeinsteinNM, MSN Certified Nurse-Midwife 03/02/2020 6:14 AM

## 2020-03-02 NOTE — Telephone Encounter (Signed)
Left HIPPA-compliant message to call back [name] from [facility] at [phone] (main office) or [phone] (Delayna Sparlin's office).   

## 2020-03-02 NOTE — MAU Note (Signed)
Patient tearful at time of discharge instruction. Patient reports feeling depressed. Discussed with Paulina Fusi, CNM. Provider will reach out to the office and patient to follow up.

## 2020-03-02 NOTE — Discharge Instructions (Signed)
Nausea and Vomiting, Adult Nausea is the feeling that you have an upset stomach or that you are about to vomit. Vomiting is when stomach contents are thrown up and out of the mouth as a result of nausea. Vomiting can make you feel weak and cause you to become dehydrated. Dehydration can make you feel tired and thirsty, cause you to have a dry mouth, and decrease how often you urinate. Older adults and people with other diseases or a weak disease-fighting system (immune system) are at higher risk for dehydration. It is important to treat your nausea and vomiting as told by your health care provider. Follow these instructions at home: Watch your symptoms for any changes. Tell your health care provider about them. Follow these instructions to care for yourself at home. Eating and drinking      Take an oral rehydration solution (ORS). This is a drink that is sold at pharmacies and retail stores.  Drink clear fluids slowly and in small amounts as you are able. Clear fluids include water, ice chips, low-calorie sports drinks, and fruit juice that has water added (diluted fruit juice).  Eat bland, easy-to-digest foods in small amounts as you are able. These foods include bananas, applesauce, rice, lean meats, toast, and crackers.  Avoid fluids that contain a lot of sugar or caffeine, such as energy drinks, sports drinks, and soda.  Avoid alcohol.  Avoid spicy or fatty foods. General instructions  Take over-the-counter and prescription medicines only as told by your health care provider.  Drink enough fluid to keep your urine pale yellow.  Wash your hands often using soap and water. If soap and water are not available, use hand sanitizer.  Make sure that all people in your household wash their hands well and often.  Rest at home while you recover.  Watch your condition for any changes.  Breathe slowly and deeply when you feel nauseated.  Keep all follow-up visits as told by your health  care provider. This is important. Contact a health care provider if:  Your symptoms get worse.  You have new symptoms.  You have a fever.  You cannot drink fluids without vomiting.  Your nausea does not go away after 2 days.  You feel light-headed or dizzy.  You have a headache.  You have muscle cramps.  You have a rash.  You have pain while urinating. Get help right away if:  You have pain in your chest, neck, arm, or jaw.  You feel extremely weak or you faint.  You have persistent vomiting.  You have vomit that is bright red or looks like black coffee grounds.  You have bloody or black stools or stools that look like tar.  You have a severe headache, a stiff neck, or both.  You have severe pain, cramping, or bloating in your abdomen.  You have difficulty breathing, or you are breathing very quickly.  Your heart is beating very quickly.  Your skin feels cold and clammy.  You feel confused.  You have signs of dehydration, such as: ? Dark urine, very little urine, or no urine. ? Cracked lips. ? Dry mouth. ? Sunken eyes. ? Sleepiness. ? Weakness. These symptoms may represent a serious problem that is an emergency. Do not wait to see if the symptoms will go away. Get medical help right away. Call your local emergency services (911 in the [place] Do not drive yourself to the hospital. Summary  Nausea is the feeling that you have an upset stomach  or that you are about to vomit. As nausea gets worse, it can lead to vomiting. Vomiting can make you feel weak and cause you to become dehydrated.  Follow instructions from your health care provider about eating and drinking to prevent dehydration.  Take over-the-counter and prescription medicines only as told by your health care provider.  Contact your health care provider if your symptoms get worse, or you have new symptoms.  Keep all follow-up visits as told by your health care provider. This is important. This  information is not intended to replace advice given to you by your health care provider. Make sure you discuss any questions you have with your health care provider. Document Revised: 01/29/2019 Document Reviewed: 03/17/2018 Elsevier Patient Education  2020 Elsevier Inc.  Pregnancy and Urinary Tract Infection  A urinary tract infection (UTI) is an infection of any part of the urinary tract. This includes the kidneys, the tubes that connect your kidneys to your bladder (ureters), the bladder, and the tube that carries urine out of your body (urethra). These organs make, store, and get rid of urine in the body. Your health care provider may use other names to describe the infection. An upper UTI affects the ureters and kidneys (pyelonephritis). A lower UTI affects the bladder (cystitis) and urethra (urethritis). Most urinary tract infections are caused by bacteria in your genital area, around the entrance to your urinary tract (urethra). These bacteria grow and cause irritation and inflammation of your urinary tract. You are more likely to develop a UTI during pregnancy because the physical and hormonal changes your body goes through can make it easier for bacteria to get into your urinary tract. Your growing baby also puts pressure on your bladder and can affect urine flow. It is important to recognize and treat UTIs in pregnancy because of the risk of serious complications for both you and your baby. How does this affect me? Symptoms of a UTI include:  Needing to urinate right away (urgently).  Frequent urination or passing small amounts of urine frequently.  Pain or burning with urination.  Blood in the urine.  Urine that smells bad or unusual.  Trouble urinating.  Cloudy urine.  Pain in the abdomen or lower back.  Vaginal discharge. You may also have:  Vomiting or a decreased appetite.  Confusion.  Irritability or tiredness.  A fever.  Diarrhea. How does this affect my  baby? An untreated UTI during pregnancy could lead to a kidney infection or a systemic infection, which can cause health problems that could affect your baby. Possible complications of an untreated UTI include:  Giving birth to your baby before 37 weeks of pregnancy (premature).  Having a baby with a low birth weight.  Developing high blood pressure during pregnancy (preeclampsia).  Having a low hemoglobin level (anemia). What can I do to lower my risk? To prevent a UTI:  Go to the bathroom as soon as you feel the need. Do not hold urine for long periods of time.  Always wipe from front to back, especially after a bowel movement. Use each tissue one time when you wipe.  Empty your bladder after sex.  Keep your genital area dry.  Drink 6-10 glasses of water each day.  Do not douche or use deodorant sprays. How is this treated? Treatment for this condition may include:  Antibiotic medicines that are safe to take during pregnancy.  Other medicines to treat less common causes of UTI. Follow these instructions at home:  If  you were prescribed an antibiotic medicine, take it as told by your health care provider. Do not stop using the antibiotic even if you start to feel better.  Keep all follow-up visits as told by your health care provider. This is important. Contact a health care provider if:  Your symptoms do not improve or they get worse.  You have abnormal vaginal discharge. Get help right away if you:  Have a fever.  Have nausea and vomiting.  Have back or side pain.  Feel contractions in your uterus.  Have lower belly pain.  Have a gush of fluid from your vagina.  Have blood in your urine. Summary  A urinary tract infection (UTI) is an infection of any part of the urinary tract, which includes the kidneys, ureters, bladder, and urethra.  Most urinary tract infections are caused by bacteria in your genital area, around the entrance to your urinary tract  (urethra).  You are more likely to develop a UTI during pregnancy.  If you were prescribed an antibiotic medicine, take it as told by your health care provider. Do not stop using the antibiotic even if you start to feel better. This information is not intended to replace advice given to you by your health care provider. Make sure you discuss any questions you have with your health care provider. Document Revised: 01/29/2019 Document Reviewed: 09/10/2018 Elsevier Patient Education  [place].

## 2020-03-04 LAB — CULTURE, OB URINE: Culture: 30000 — AB

## 2020-03-07 ENCOUNTER — Ambulatory Visit (INDEPENDENT_AMBULATORY_CARE_PROVIDER_SITE_OTHER): Payer: Medicaid Other | Admitting: Obstetrics and Gynecology

## 2020-03-07 ENCOUNTER — Telehealth: Payer: Self-pay

## 2020-03-07 ENCOUNTER — Other Ambulatory Visit (HOSPITAL_COMMUNITY)
Admission: RE | Admit: 2020-03-07 | Discharge: 2020-03-07 | Disposition: A | Discharge status: Home / Self Care | Payer: Medicaid Other | Source: Ambulatory Visit | Attending: Obstetrics and Gynecology | Admitting: Obstetrics and Gynecology

## 2020-03-07 ENCOUNTER — Encounter: Payer: Self-pay | Admitting: Obstetrics and Gynecology

## 2020-03-07 ENCOUNTER — Ambulatory Visit (INDEPENDENT_AMBULATORY_CARE_PROVIDER_SITE_OTHER): Payer: Medicaid Other | Admitting: Licensed Clinical Social Worker

## 2020-03-07 ENCOUNTER — Other Ambulatory Visit: Payer: Self-pay

## 2020-03-07 VITALS — BP 119/75 | HR 125 | Wt 113.0 lb

## 2020-03-07 DIAGNOSIS — O99340 Other mental disorders complicating pregnancy, unspecified trimester: Secondary | ICD-10-CM

## 2020-03-07 DIAGNOSIS — O0990 Supervision of high risk pregnancy, unspecified, unspecified trimester: Secondary | ICD-10-CM

## 2020-03-07 DIAGNOSIS — O09892 Supervision of other high risk pregnancies, second trimester: Secondary | ICD-10-CM | POA: Diagnosis not present

## 2020-03-07 DIAGNOSIS — F32A Depression, unspecified: Secondary | ICD-10-CM | POA: Insufficient documentation

## 2020-03-07 DIAGNOSIS — Z8759 Personal history of other complications of pregnancy, childbirth and the puerperium: Secondary | ICD-10-CM

## 2020-03-07 DIAGNOSIS — Z3A17 17 weeks gestation of pregnancy: Secondary | ICD-10-CM

## 2020-03-07 DIAGNOSIS — F329 Major depressive disorder, single episode, unspecified: Secondary | ICD-10-CM

## 2020-03-07 DIAGNOSIS — O99342 Other mental disorders complicating pregnancy, second trimester: Secondary | ICD-10-CM | POA: Diagnosis not present

## 2020-03-07 MED ORDER — SERTRALINE HCL 50 MG PO TABS: 50.0000 mg | ORAL_TABLET | Freq: Every day | ORAL | 5 refills | Status: DC

## 2020-03-07 NOTE — Progress Notes (Signed)
NOB  Genetic Screening: Desires   Last pap: 11/2017 WNL  Currently taking Rx for UTI treated at hospital on 03/02/20  CC: pt has concerns about weight gain and no appetite pt denies any Nausea or Vomiting.  *Pt very tearful due to stressors.   *Pt given navigation tablet.

## 2020-03-07 NOTE — Telephone Encounter (Signed)
Returned call, no answer, left vm 

## 2020-03-07 NOTE — Patient Instructions (Signed)

## 2020-03-07 NOTE — Progress Notes (Signed)
Subjective:  Sandra Matthews is a 24 y.o. [phone] at [redacted]w[redacted]d being seen today for first OB appt. EDD by LMP and confirmed by first trimester U/S. H/O PTD secondary to PROM. TSVD x 2 afterwards but took 17 OHP. Desires to take this year. She does report some problems with depression. Denies HI/SI.  She is interested in medication.  She is currently monitored for the following issues for this high-risk pregnancy and has History of preterm delivery, currently pregnant in second trimester; History of preterm premature rupture of membranes (PPROM); Anti-M isoimmunization affecting pregnancy, antepartum; Marijuana use; Supervision of high risk pregnancy, antepartum; and Depression affecting pregnancy on their problem list.  Patient reports see above.  Contractions: Not present. Vag. Bleeding: None.  Movement: Present. Denies leaking of fluid.   The following portions of the patient's history were reviewed and updated as appropriate: allergies, current medications, past family history, past medical history, past social history, past surgical history and problem list. Problem list updated.  Objective:   Vitals:   03/07/20 1336  BP: 119/75  Pulse: (!) 125  Weight: 113 lb (51.3 kg)    Fetal Status: Fetal Heart Rate (bpm): 154   Movement: Present     General:  Alert, oriented and cooperative. Patient is in no acute distress.  Skin: Skin is warm and dry. No rash noted.   Cardiovascular: Normal heart rate noted  Respiratory: Normal respiratory effort, no problems with respiration noted  Abdomen: Soft, gravid, appropriate for gestational age. Pain/Pressure: Absent     Pelvic:  Cervical exam deferred        Extremities: Normal range of motion.  Edema: None  Mental Status: Normal mood and affect. Normal behavior. Normal judgment and thought content.   Urinalysis:      Assessment and Plan:  Pregnancy: [id number] at [redacted]w[redacted]d  1. Supervision of high risk pregnancy, antepartum Prenatal care and labs  reviewed with pt. - Obstetric Panel, Including HIV - Cervicovaginal ancillary only( [place]) - Genetic Screening - Hepatitis C antibody - [place] MFM OB COMP + 14 WK; Future  2. History of preterm premature rupture of membranes (PPROM) Took 7 OHP with last 2 pregnancies. Paper work completed today.  3. History of preterm delivery, currently pregnant in second trimester See above  4. Depression affecting pregnancy Referral to [facility] - sertraline (ZOLOFT) 50 MG tablet; Take 1 tablet (50 mg total) by mouth daily. Take 1/2 tablet qd x 7 days and then 1 whole tablet qd  Dispense: 30 tablet; Refill: 5  Preterm labor symptoms and general obstetric precautions including but not limited to vaginal bleeding, contractions, leaking of fluid and fetal movement were reviewed in detail with the patient. Please refer to After Visit Summary for other counseling recommendations.  Return in about 4 weeks (around 04/04/2020) for face to face, any provider.   Hermina Staggers, MD

## 2020-03-08 ENCOUNTER — Inpatient Hospital Stay (HOSPITAL_COMMUNITY)
Admission: AD | Admit: 2020-03-08 | Discharge: 2020-03-08 | Disposition: A | Discharge status: Home / Self Care | Payer: Medicaid Other | Attending: Family Medicine | Admitting: Family Medicine

## 2020-03-08 ENCOUNTER — Encounter (HOSPITAL_COMMUNITY): Payer: Self-pay | Admitting: Family Medicine

## 2020-03-08 DIAGNOSIS — Z79899 Other long term (current) drug therapy: Secondary | ICD-10-CM | POA: Insufficient documentation

## 2020-03-08 DIAGNOSIS — Z87891 Personal history of nicotine dependence: Secondary | ICD-10-CM | POA: Insufficient documentation

## 2020-03-08 DIAGNOSIS — F32A Depression, unspecified: Secondary | ICD-10-CM

## 2020-03-08 DIAGNOSIS — F329 Major depressive disorder, single episode, unspecified: Secondary | ICD-10-CM | POA: Insufficient documentation

## 2020-03-08 DIAGNOSIS — O99342 Other mental disorders complicating pregnancy, second trimester: Secondary | ICD-10-CM | POA: Diagnosis not present

## 2020-03-08 DIAGNOSIS — Z634 Disappearance and death of family member: Secondary | ICD-10-CM | POA: Insufficient documentation

## 2020-03-08 DIAGNOSIS — R531 Weakness: Secondary | ICD-10-CM

## 2020-03-08 DIAGNOSIS — Z711 Person with feared health complaint in whom no diagnosis is made: Secondary | ICD-10-CM

## 2020-03-08 DIAGNOSIS — R682 Dry mouth, unspecified: Secondary | ICD-10-CM | POA: Diagnosis not present

## 2020-03-08 DIAGNOSIS — R630 Anorexia: Secondary | ICD-10-CM | POA: Diagnosis not present

## 2020-03-08 DIAGNOSIS — Z733 Stress, not elsewhere classified: Secondary | ICD-10-CM

## 2020-03-08 DIAGNOSIS — Z3A17 17 weeks gestation of pregnancy: Secondary | ICD-10-CM | POA: Diagnosis not present

## 2020-03-08 DIAGNOSIS — O26892 Other specified pregnancy related conditions, second trimester: Secondary | ICD-10-CM | POA: Diagnosis present

## 2020-03-08 LAB — URINALYSIS, ROUTINE W REFLEX MICROSCOPIC
Bilirubin Urine: NEGATIVE
Glucose, UA: NEGATIVE mg/dL
Hgb urine dipstick: NEGATIVE
Ketones, ur: 80 mg/dL — AB
Nitrite: NEGATIVE
Protein, ur: 30 mg/dL — AB
Specific Gravity, Urine: 1.028 (ref 1.005–1.030)
pH: 5 (ref 5.0–8.0)

## 2020-03-08 LAB — URINE CYTOLOGY ANCILLARY ONLY
Chlamydia: NEGATIVE
Comment: NEGATIVE
Comment: NORMAL
Neisseria Gonorrhea: NEGATIVE

## 2020-03-08 MED ORDER — NATACHEW 28-1 MG PO CHEW: 1.0000 | CHEWABLE_TABLET | Freq: Every day | ORAL | 12 refills | Status: DC

## 2020-03-08 NOTE — MAU Provider Note (Signed)
Chief Complaint:  Dehydration   First Provider Initiated Contact with Patient 03/08/20 [phone]     HPI: Sandra Matthews is a 24 y.o. [phone] at 42w3dho presents to maternity admissions reporting feeling weak and jittery "like I am dehydrated" with a dry mouth.  Thinks these are caused by the Zoloft she just started today (has only taken one dose).    States is drinking a lot and keeps it all down.  Is wanting to take "one a day vitamins" to help her with her appetite.  States when she is "very stressed" she "just doesn't eat"   Only ate lunch today.  Drank a lot.  No vomiting.  Had cramping yesterday but none now.    Reports she is very stressed, prior notes reflect recent death of her brother.  States has an interview with [employer] today for a job and is excited about it. Wants to start school soon. . She denies LOF, vaginal bleeding, vaginal itching/burning, urinary symptoms, h/a, diarrhea, constipation or fever/chills.    Other This is a new problem. The current episode started today. The problem occurs intermittently. The problem has been unchanged. Associated symptoms include anorexia (due to stress), nausea and weakness. Pertinent negatives include no abdominal pain, chest pain, chills, fever, headaches, myalgias, urinary symptoms, vertigo, visual change or vomiting. Nothing aggravates the symptoms. She has tried nothing for the symptoms.    RN Note: Patient reports starting Zoloft yesterday and since then has been feeling jittery and like she is dehydrated.  Reports she hasn't had vomiting-is also taking her medication for N/V and just felt like she had to vomit.  Denies VB/LOF.  Some cramping when she woke up.    Past Medical History: Past Medical History:  Diagnosis Date  . Anemia   . Depression   . Preterm labor     Past obstetric history: OB History  Gravida Para Term Preterm AB Living  '4 3 2 1 ' 0 3  SAB TAB Ectopic Multiple Live Births  0 0 0 0 3    # Outcome Date  GA Lbr Len/2nd Weight Sex Delivery Anes PTL Lv  4 Current           3 Term 03/17/18 326w0d6:15 / 00:02 2050 g M Vag-Spont None  LIV  2 Term 03/24/16 3860w1d:33 / 00:04 2825 g F Vag-Spont None  LIV  1 Preterm 03/09/11 [id number] g M Vag-Spont None Y LIV    Past Surgical History: Past Surgical History:  Procedure Laterality Date  . FRACTURE SURGERY     broken arm in childhood, cast only, no surgery    Family History: Family History  Problem Relation Age of Onset  . Bipolar disorder Father   . Depression Father   . Anxiety disorder Father   . Arthritis Father   . Diabetes Maternal Grandmother   . Hypertension Maternal Grandfather   . Depression Paternal Grandmother     Social History: Social History   Tobacco Use  . Smoking status: Former Smoker    Types: Cigarettes    Quit date: 10/22/2019    Years since quitting: 0.3  . Smokeless tobacco: Never Used  Substance Use Topics  . Alcohol use: No  . Drug use: Not Currently    Types: Marijuana    Comment: Last use 01/30/2020    Allergies: No Known Allergies  Meds:  Medications Prior to Admission  Medication Sig Dispense Refill Last Dose  . nitrofurantoin, macrocrystal-monohydrate, (MACROBID) 100 MG capsule  Take 1 capsule (100 mg total) by mouth 2 (two) times daily. 14 capsule 1 03/07/2020 at Unknown time  . ondansetron (ZOFRAN ODT) 4 MG disintegrating tablet Take 1 tablet (4 mg total) by mouth every 6 (six) hours as needed for nausea. 20 tablet 0 03/07/2020 at 1500  . sertraline (ZOLOFT) 50 MG tablet Take 1 tablet (50 mg total) by mouth daily. Take 1/2 tablet qd x 7 days and then 1 whole tablet qd 30 tablet 5 03/07/2020 at Unknown time  . Blood Pressure Monitoring (BLOOD PRESSURE KIT) DEVI 1 kit by Does not apply route once a week. Check Blood Pressure regularly and record readings into the [employer] App.  Large Cuff.  DX O90.0 1 each 0   . Prenatal Vit-Fe Fumarate-FA (MULTIVITAMIN-PRENATAL) 27-0.8 MG TABS tablet Take 1  tablet by mouth daily at 12 noon.     . promethazine (PHENERGAN) 25 MG tablet Take 1 tablet (25 mg total) by mouth every 6 (six) hours as needed for nausea or vomiting. 30 tablet 0     I have reviewed patient's Past Medical Hx, Surgical Hx, Family Hx, Social Hx, medications and allergies.   ROS:  Review of Systems  Constitutional: Negative for chills and fever.  Cardiovascular: Negative for chest pain.  Gastrointestinal: Positive for anorexia (due to stress) and nausea. Negative for abdominal pain and vomiting.  Musculoskeletal: Negative for myalgias.  Neurological: Positive for weakness. Negative for vertigo and headaches.   Other systems negative  Physical Exam   Patient Vitals for the past 24 hrs:  BP Temp Pulse Resp SpO2 Weight  03/08/20 0426 108/64 -- 83 15 99 % --  03/08/20 0425 -- 98.5 F (36.9 C) -- -- -- 50.2 kg   Orthostatic Vital signs done and are all normal .  No change with position changes  Constitutional: Well-developed, well-nourished female in no acute distress.  Cardiovascular: normal rate and rhythm, no ectopy audible Respiratory: normal effort, clear to auscultation bilaterally GI: Abd soft, non-tender, gravid appropriate for gestational age.   No rebound or guarding. MS: Extremities nontender, no edema, normal ROM Neurologic: Alert and oriented x 4.  GU: Neg CVAT.  PELVIC EXAM: deferred, denies cramping   FHT:  150    Labs: Results for orders placed or performed during the hospital encounter of 03/08/20 (from the past 24 hour(s))  Urinalysis, Routine w reflex microscopic     Status: Abnormal   Collection Time: 03/08/20  4:36 AM  Result Value Ref Range   Color, Urine YELLOW YELLOW   APPearance HAZY (A) CLEAR   Specific Gravity, Urine 1.028 1.005 - 1.030   pH 5.0 5.0 - 8.0   Glucose, UA NEGATIVE NEGATIVE mg/dL   Hgb urine dipstick NEGATIVE NEGATIVE   Bilirubin Urine NEGATIVE NEGATIVE   Ketones, ur 80 (A) NEGATIVE mg/dL   Protein, ur 30 (A)  NEGATIVE mg/dL   Nitrite NEGATIVE NEGATIVE   Leukocytes,Ua SMALL (A) NEGATIVE   RBC / HPF 0-5 0 - 5 RBC/hpf   WBC, UA 0-5 0 - 5 WBC/hpf   Bacteria, UA RARE (A) NONE SEEN   Squamous Epithelial / LPF 11-20 0 - 5   Mucus PRESENT      Imaging:  No results found.  MAU Course/MDM: I have ordered labs and reviewed results. Urine showed ketones, but since her orthostatic VS are normal, and she reports she only eats one meal a day, I think the ketones are from low food intake (pt has gained 1 Kg in a week  however)  Praised her for gaining weight and staying hydrated.  Reviewed tips and tricks for more frequent eating and drinking  Discussed stress reduction and encouraged her to keep seeking to achieve her goals   Reviewed side effect profile with pharmacist.  Recommended she give it 1-2 more days and if the dry mouth and feelings of weakness persist, call office and discuss medication regimen.  Advised not to stop Zoloft abruptly.     Discussed prenatal vitamins are preferable to one-a-day vitamins.  She prefers chewable so I will prescribe Natachew for her to try.   Assessment: Single IUP at 4w3dDry mouth and feelings of weakness, ?side effects of Zoloft (too soon to tell) Poor appetite due to stress Normal orthostatic vital signs, doubt dehydration.  Plan: Discharge home Advised not to stop zoloft just yet, update office by Friday Rx Natachew PNV for supplementation  Encouraged increased frequency of meals throughout day Encouraged frequent liquid intake, may mix water and lemonade (her favorite) Follow up in Office for prenatal visits and recheck  Encouraged to return here or to other Urgent Care/ED if she develops worsening of symptoms, increase in pain, fever, or other concerning symptoms.   Pt stable at time of discharge.  MHansel FeinsteinCNM, MSN Certified Nurse-Midwife 03/08/2020 4:33 AM

## 2020-03-08 NOTE — Discharge Instructions (Signed)
Weakness Weakness is a lack of strength. You may feel weak all over your body (generalized), or you may feel weak in one specific part of your body (focal). Common causes of weakness include:  Infection and immune system disorders.  Physical exhaustion.  Internal bleeding or other blood loss that results in a lack of red blood cells (anemia).  Dehydration.  An imbalance in mineral (electrolyte) levels, such as potassium.  Heart disease, circulation problems, or stroke. Other causes include:  Some medicines or cancer treatment.  Stress, anxiety, or depression.  Nervous system disorders.  Thyroid disorders.  Loss of muscle strength because of age or inactivity.  Poor sleep quality or sleep disorders. The cause of your weakness may not be known. Some causes of weakness can be serious, so it is important to see your health care provider. Follow these instructions at home: Activity  Rest as needed.  Try to get enough sleep. Most adults need 7-8 hours of quality sleep each night. Talk to your health care provider about how much sleep you need each night.  Do exercises, such as arm curls and leg raises, for 30 minutes at least 2 days a week or as told by your health care provider. This helps build muscle strength.  Consider working with a physical therapist or trainer who can develop an exercise plan to help you gain muscle strength. General instructions   Take over-the-counter and prescription medicines only as told by your health care provider.  Eat a healthy, well-balanced diet. This includes: ? Proteins to build muscles, such as lean meats and fish. ? Fresh fruits and vegetables. ? Carbohydrates to boost energy, such as whole grains.  Drink enough fluid to keep your urine pale yellow.  Keep all follow-up visits as told by your health care provider. This is important. Contact a health care provider if your weakness:  Does not improve or gets worse.  Affects your  ability to think clearly.  Affects your ability to do your normal daily activities. Get help right away if you:  Develop sudden weakness, especially on one side of your face or body.  Have chest pain.  Have trouble breathing or shortness of breath.  Have problems with your vision.  Have trouble talking or swallowing.  Have trouble standing or walking.  Are light-headed or lose consciousness. Summary  Weakness is a lack of strength. You may feel weak all over your body or just in one specific part of your body.  Weakness can be caused by a variety of things. In some cases, the cause may be unknown.  Rest as needed, and try to get enough sleep. Most adults need 7-8 hours of quality sleep each night.  Eat a healthy, well-balanced diet. This information is not intended to replace advice given to you by your health care provider. Make sure you discuss any questions you have with your health care provider. Document Revised: 05/13/2018 Document Reviewed: 05/13/2018 Elsevier Patient Education  2020 [employer].  Second Trimester of Pregnancy The second trimester is from week 14 through week 27 (months 4 through 6). The second trimester is often a time when you feel your best. Your body has adjusted to being pregnant, and you begin to feel better physically. Usually, morning sickness has lessened or quit completely, you may have more energy, and you may have an increase in appetite. The second trimester is also a time when the fetus is growing rapidly. At the end of the sixth month, the fetus is about  9 inches long and weighs about 1 pounds. You will likely begin to feel the baby move (quickening) between 16 and 20 weeks of pregnancy. Body changes during your second trimester Your body continues to go through many changes during your second trimester. The changes vary from woman to woman.  Your weight will continue to increase. You will notice your lower abdomen bulging out.  You may  begin to get stretch marks on your hips, abdomen, and breasts.  You may develop headaches that can be relieved by medicines. The medicines should be approved by your health care provider.  You may urinate more often because the fetus is pressing on your bladder.  You may develop or continue to have heartburn as a result of your pregnancy.  You may develop constipation because certain hormones are causing the muscles that push waste through your intestines to slow down.  You may develop hemorrhoids or swollen, bulging veins (varicose veins).  You may have back pain. This is caused by: ? Weight gain. ? Pregnancy hormones that are relaxing the joints in your pelvis. ? A shift in weight and the muscles that support your balance.  Your breasts will continue to grow and they will continue to become tender.  Your gums may bleed and may be sensitive to brushing and flossing.  Dark spots or blotches (chloasma, mask of pregnancy) may develop on your face. This will likely fade after the baby is born.  A dark line from your belly button to the pubic area (linea nigra) may appear. This will likely fade after the baby is born.  You may have changes in your hair. These can include thickening of your hair, rapid growth, and changes in texture. Some women also have hair loss during or after pregnancy, or hair that feels dry or thin. Your hair will most likely return to normal after your baby is born. What to expect at prenatal visits During a routine prenatal visit:  You will be weighed to make sure you and the fetus are growing normally.  Your blood pressure will be taken.  Your abdomen will be measured to track your baby's growth.  The fetal heartbeat will be listened to.  Any test results from the previous visit will be discussed. Your health care provider may ask you:  How you are feeling.  If you are feeling the baby move.  If you have had any abnormal symptoms, such as leaking  fluid, bleeding, severe headaches, or abdominal cramping.  If you are using any tobacco products, including cigarettes, chewing tobacco, and electronic cigarettes.  If you have any questions. Other tests that may be performed during your second trimester include:  Blood tests that check for: ? Low iron levels (anemia). ? High blood sugar that affects pregnant women (gestational diabetes) between 38 and 28 weeks. ? Rh antibodies. This is to check for a protein on red blood cells (Rh factor).  Urine tests to check for infections, diabetes, or protein in the urine.  An ultrasound to confirm the proper growth and development of the baby.  An amniocentesis to check for possible genetic problems.  Fetal screens for spina bifida and Down syndrome.  HIV (human immunodeficiency virus) testing. Routine prenatal testing includes screening for HIV, unless you choose not to have this test. Follow these instructions at home: Medicines  Follow your health care provider's instructions regarding medicine use. Specific medicines may be either safe or unsafe to take during pregnancy.  Take a prenatal vitamin that contains  at least 600 micrograms (mcg) of folic acid.  If you develop constipation, try taking a stool softener if your health care provider approves. Eating and drinking   Eat a balanced diet that includes fresh fruits and vegetables, whole grains, good sources of protein such as meat, eggs, or tofu, and low-fat dairy. Your health care provider will help you determine the amount of weight gain that is right for you.  Avoid raw meat and uncooked cheese. These carry germs that can cause birth defects in the baby.  If you have low calcium intake from food, talk to your health care provider about whether you should take a daily calcium supplement.  Limit foods that are high in fat and processed sugars, such as fried and sweet foods.  To prevent constipation: ? Drink enough fluid to keep  your urine clear or pale yellow. ? Eat foods that are high in fiber, such as fresh fruits and vegetables, whole grains, and beans. Activity  Exercise only as directed by your health care provider. Most women can continue their usual exercise routine during pregnancy. Try to exercise for 30 minutes at least 5 days a week. Stop exercising if you experience uterine contractions.  Avoid heavy lifting, wear low heel shoes, and practice good posture.  A sexual relationship may be continued unless your health care provider directs you otherwise. Relieving pain and discomfort  Wear a good support bra to prevent discomfort from breast tenderness.  Take warm sitz baths to soothe any pain or discomfort caused by hemorrhoids. Use hemorrhoid cream if your health care provider approves.  Rest with your legs elevated if you have leg cramps or low back pain.  If you develop varicose veins, wear support hose. Elevate your feet for 15 minutes, 3-4 times a day. Limit salt in your diet. Prenatal Care  Write down your questions. Take them to your prenatal visits.  Keep all your prenatal visits as told by your health care provider. This is important. Safety  Wear your seat belt at all times when driving.  Make a list of emergency phone numbers, including numbers for family, friends, the hospital, and police and fire departments. General instructions  Ask your health care provider for a referral to a local prenatal education class. Begin classes no later than the beginning of month 6 of your pregnancy.  Ask for help if you have counseling or nutritional needs during pregnancy. Your health care provider can offer advice or refer you to specialists for help with various needs.  Do not use hot tubs, steam rooms, or saunas.  Do not douche or use tampons or scented sanitary pads.  Do not cross your legs for long periods of time.  Avoid cat litter boxes and soil used by cats. These carry germs that can  cause birth defects in the baby and possibly loss of the fetus by miscarriage or stillbirth.  Avoid all smoking, herbs, alcohol, and unprescribed drugs. Chemicals in these products can affect the formation and growth of the baby.  Do not use any products that contain nicotine or tobacco, such as cigarettes and e-cigarettes. If you need help quitting, ask your health care provider.  Visit your dentist if you have not gone yet during your pregnancy. Use a soft toothbrush to brush your teeth and be gentle when you floss. Contact a health care provider if:  You have dizziness.  You have mild pelvic cramps, pelvic pressure, or nagging pain in the abdominal area.  You have persistent nausea,  vomiting, or diarrhea.  You have a bad smelling vaginal discharge.  You have pain when you urinate. Get help right away if:  You have a fever.  You are leaking fluid from your vagina.  You have spotting or bleeding from your vagina.  You have severe abdominal cramping or pain.  You have rapid weight gain or weight loss.  You have shortness of breath with chest pain.  You notice sudden or extreme swelling of your face, hands, ankles, feet, or legs.  You have not felt your baby move in over an hour.  You have severe headaches that do not go away when you take medicine.  You have vision changes. Summary  The second trimester is from week 14 through week 27 (months 4 through 6). It is also a time when the fetus is growing rapidly.  Your body goes through many changes during pregnancy. The changes vary from woman to woman.  Avoid all smoking, herbs, alcohol, and unprescribed drugs. These chemicals affect the formation and growth your baby.  Do not use any tobacco products, such as cigarettes, chewing tobacco, and e-cigarettes. If you need help quitting, ask your health care provider.  Contact your health care provider if you have any questions. Keep all prenatal visits as told by your health  care provider. This is important. This information is not intended to replace advice given to you by your health care provider. Make sure you discuss any questions you have with your health care provider. Document Revised: 01/29/2019 Document Reviewed: 11/12/2016 Elsevier Patient Education  [place].

## 2020-03-08 NOTE — MAU Note (Signed)
Patient reports starting Zoloft yesterday and since then has been feeling jittery and like she is dehydrated.  Reports she hasn't had vomiting-is also taking her medication for N/V and just felt like she had to vomit.  Denies VB/LOF.  Some cramping when she woke up.
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
## 2020-03-09 LAB — OBSTETRIC PANEL, INCLUDING HIV
Basophils Absolute: 0 10*3/uL (ref 0.0–0.2)
Basos: 0 %
EOS (ABSOLUTE): 0.1 10*3/uL (ref 0.0–0.4)
Eos: 1 %
HIV Screen 4th Generation wRfx: NONREACTIVE
Hematocrit: 35 % (ref 34.0–46.6)
Hemoglobin: 11.8 g/dL (ref 11.1–15.9)
Hepatitis B Surface Ag: NEGATIVE
Immature Grans (Abs): 0.1 10*3/uL (ref 0.0–0.1)
Immature Granulocytes: 1 %
Lymphocytes Absolute: 2.4 10*3/uL (ref 0.7–3.1)
Lymphs: 20 %
MCH: 30.7 pg (ref 26.6–33.0)
MCHC: 33.7 g/dL (ref 31.5–35.7)
MCV: 91 fL (ref 79–97)
Monocytes Absolute: 0.6 10*3/uL (ref 0.1–0.9)
Monocytes: 5 %
Neutrophils Absolute: 8.6 10*3/uL — ABNORMAL HIGH (ref 1.4–7.0)
Neutrophils: 73 %
Platelets: 425 10*3/uL (ref 150–450)
RBC: 3.84 x10E6/uL (ref 3.77–5.28)
RDW: 12.3 % (ref 11.7–15.4)
RPR Ser Ql: NONREACTIVE
Rh Factor: POSITIVE
Rubella Antibodies, IGG: 2.98 index (ref >0.99)
WBC: 11.8 10*3/uL — ABNORMAL HIGH (ref 3.4–10.8)

## 2020-03-09 LAB — HEPATITIS C ANTIBODY: Hep C Virus Ab: <0.1 s/co ratio (ref 0.0–0.9)

## 2020-03-09 LAB — AB SCR+ANTIBODY ID: Antibody Screen: POSITIVE — AB

## 2020-03-13 ENCOUNTER — Encounter: Payer: Self-pay | Admitting: Obstetrics and Gynecology

## 2020-03-14 ENCOUNTER — Encounter: Payer: Self-pay | Admitting: Obstetrics and Gynecology

## 2020-03-16 ENCOUNTER — Encounter: Payer: Self-pay | Admitting: Obstetrics and Gynecology

## 2020-03-23 ENCOUNTER — Ambulatory Visit: Payer: Medicaid Other

## 2020-03-28 ENCOUNTER — Other Ambulatory Visit: Payer: Self-pay | Admitting: Obstetrics and Gynecology

## 2020-03-28 ENCOUNTER — Other Ambulatory Visit: Payer: Self-pay

## 2020-03-28 ENCOUNTER — Ambulatory Visit: Payer: Medicaid Other | Attending: Obstetrics and Gynecology

## 2020-03-28 ENCOUNTER — Ambulatory Visit: Payer: Medicaid Other | Admitting: *Deleted

## 2020-03-28 DIAGNOSIS — O0990 Supervision of high risk pregnancy, unspecified, unspecified trimester: Secondary | ICD-10-CM | POA: Insufficient documentation

## 2020-03-28 DIAGNOSIS — Z3A20 20 weeks gestation of pregnancy: Secondary | ICD-10-CM

## 2020-03-28 DIAGNOSIS — O09212 Supervision of pregnancy with history of pre-term labor, second trimester: Secondary | ICD-10-CM | POA: Diagnosis not present

## 2020-03-28 DIAGNOSIS — Z363 Encounter for antenatal screening for malformations: Secondary | ICD-10-CM | POA: Diagnosis not present

## 2020-03-28 DIAGNOSIS — O09292 Supervision of pregnancy with other poor reproductive or obstetric history, second trimester: Secondary | ICD-10-CM

## 2020-03-28 IMAGING — US US MFM UA CORD DOPPLER
1 series · 13 of 27 positions shown · non-contrast
Comparison: none

[Series 1: us mfm ua cord doppler · 27 acquisitions, 13 frames shown]
[im 2/27]
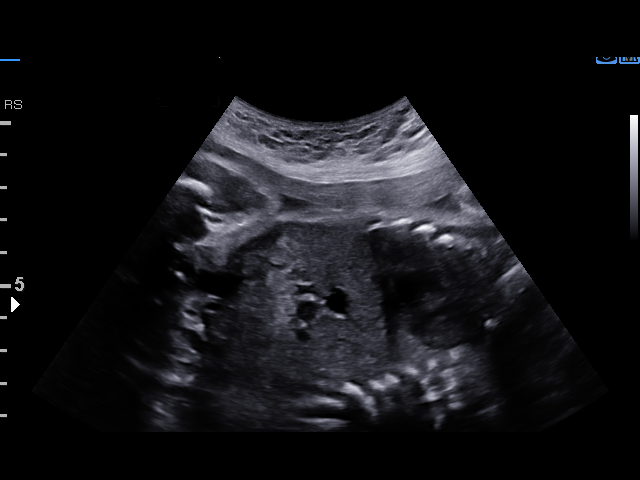
[im 4/27]
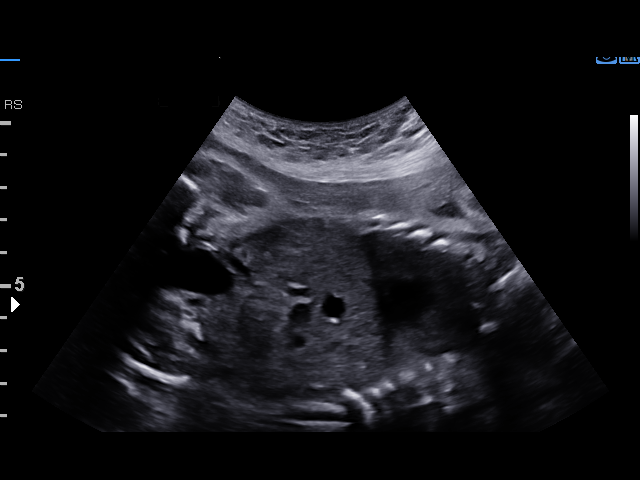
[im 6/27]
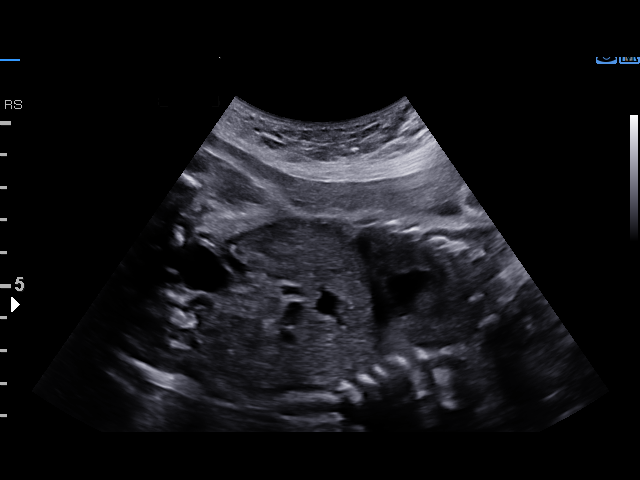
[im 8/27]
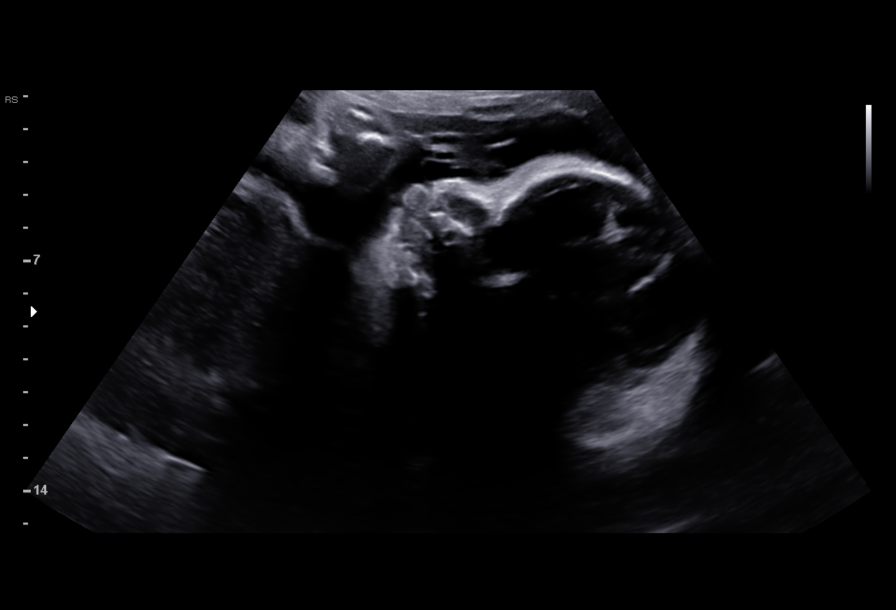
[im 10/27]
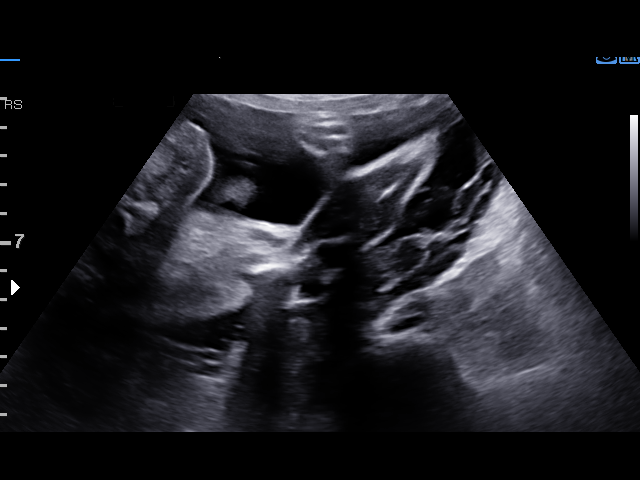
[im 12/27]
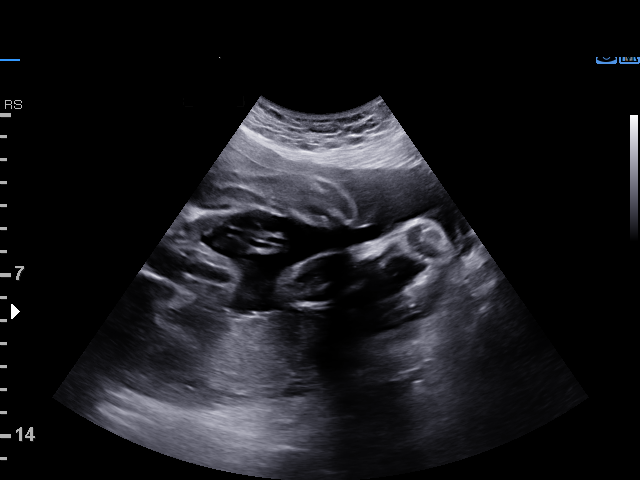
[im 14/27]
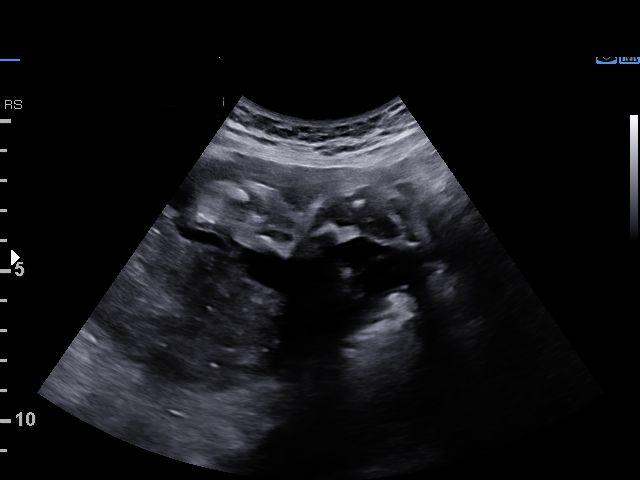
[im 16/27]
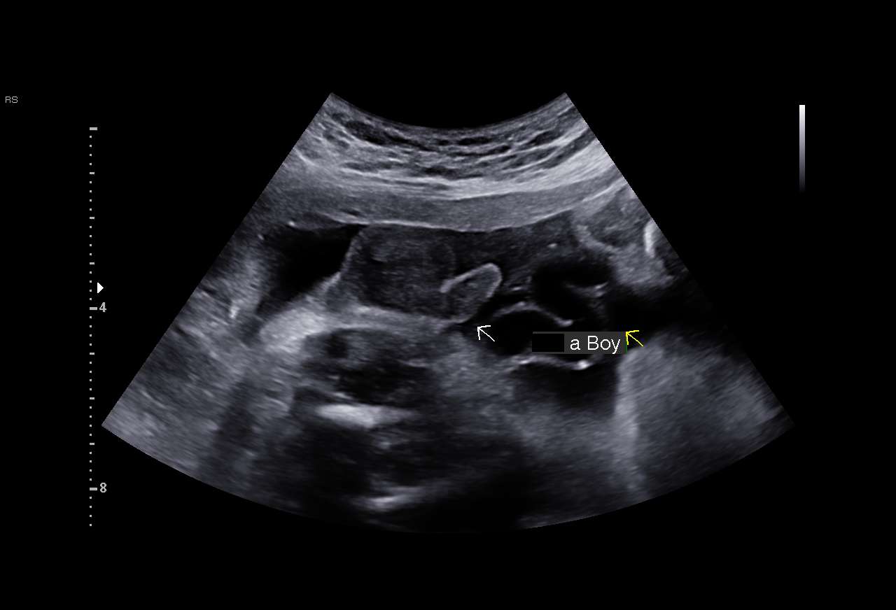
[im 18/27]
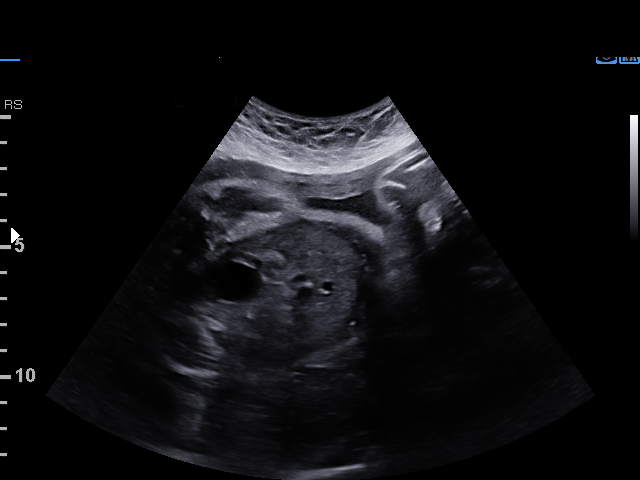
[im 20/27]
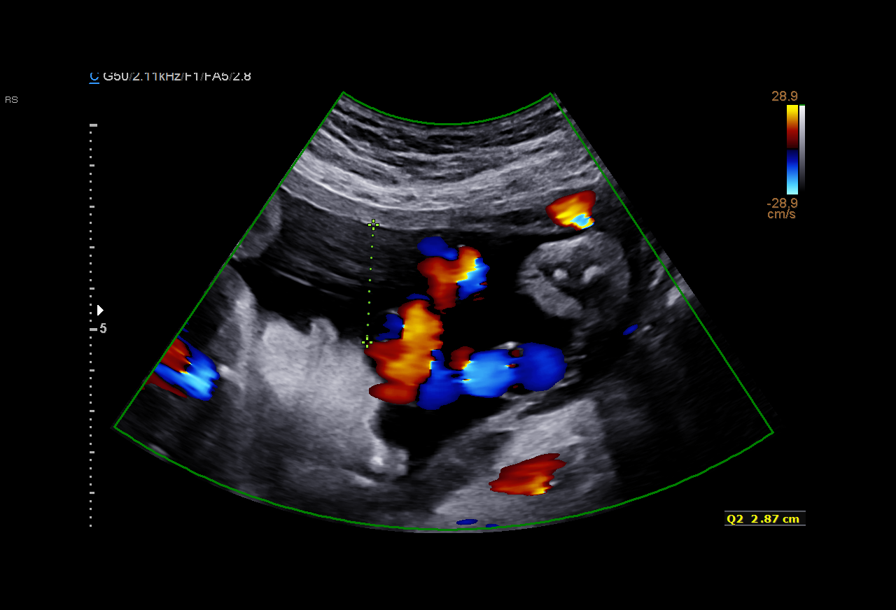
[im 22/27]
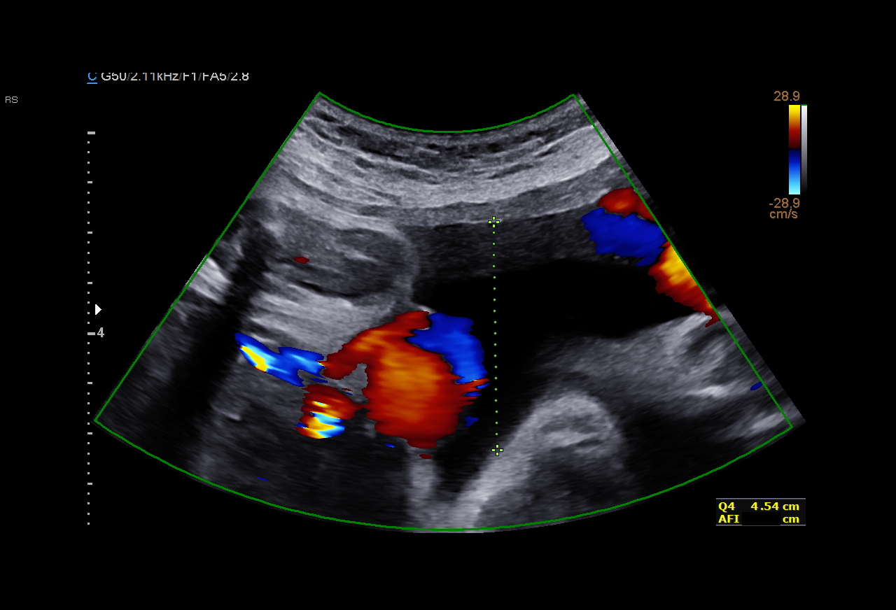
[im 24/27]
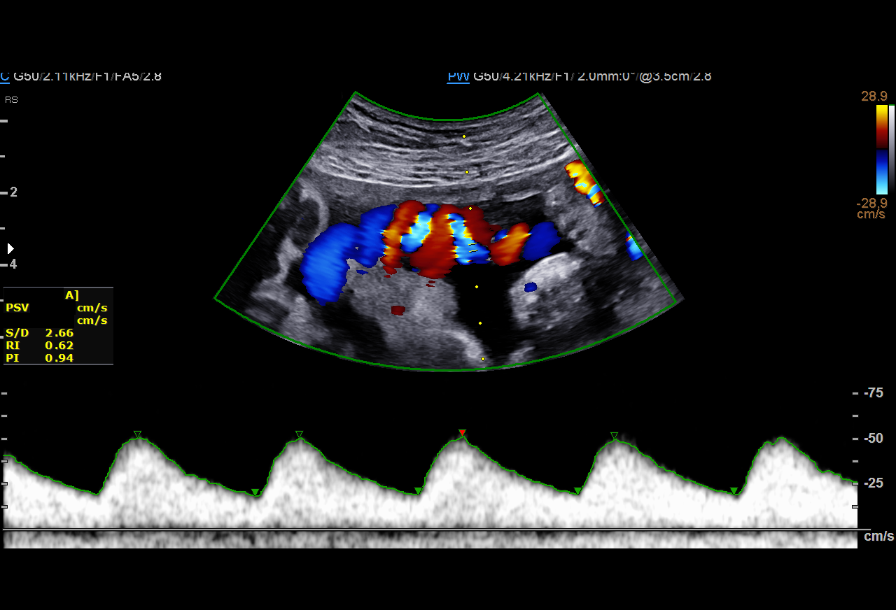
[im 26/27]
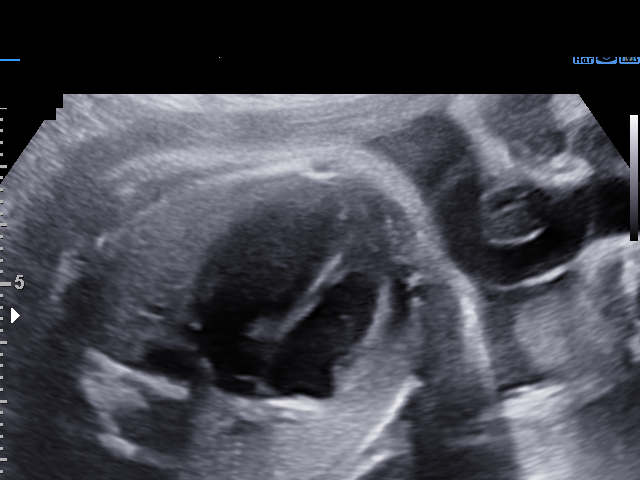

[13 of 27 positions shown; findings below may reference images not displayed]

OE

1  HUGO ALFREDO YULE              [id number]      [id number]     [id number]
2  HUGO ALFREDO YULE              [id number]      [id number]     [id number]
Indications

32 weeks gestation of pregnancy
Poor obstetric history: Previous preterm
delivery, antepartum 30 weeks - PPROM 28
weeks
Small for gestational age fetus affecting
management of mother
Insufficient Prenatal Care (care started at 26
weeks)
OB History

Gravidity:    3         Term:   1        Prem:   1
Fetal Evaluation

Num Of Fetuses:     1
Fetal Heart         143
Rate(bpm):
Cardiac Activity:   Observed
Presentation:       Cephalic

Amniotic Fluid
AFI FV:      Subjectively within normal limits

AFI Sum(cm)     %Tile       Largest Pocket(cm)
14.49           51

RUQ(cm)       RLQ(cm)       LUQ(cm)        LLQ(cm)
2.77
Biophysical Evaluation
Amniotic F.V:   Pocket => 2 cm two         F. Tone:        Observed
planes
F. Movement:    Observed                   Score:          [DATE]
F. Breathing:   Observed
Gestational Age

Clinical EDD:  32w 2d                                        EDD:   03/24/18
Best:          32w 2d     Det. By:  Clinical EDD             EDD:   03/24/18
Doppler - Fetal Vessels

Umbilical Artery
S/D     %tile     RI              PI              PSV    ADFV    RDFV
(cm/s)
2.68       51   0.63             0.95             53.18      No      No

Impression

Intrauterine pregnancy at 32+2 weeks with IUGR
Normal fetal cardiac activity
Normal amniotic fluid
Normal umbilical artery dopplers with no absent or reversed
end-diastolic flow
BPP [DATE]
Recommendations

Repeat growth, BPP and dopplers in 1 week

## 2020-03-29 ENCOUNTER — Other Ambulatory Visit: Payer: Self-pay | Admitting: *Deleted

## 2020-03-29 ENCOUNTER — Ambulatory Visit: Payer: Medicaid Other

## 2020-03-29 DIAGNOSIS — O09892 Supervision of other high risk pregnancies, second trimester: Secondary | ICD-10-CM

## 2020-04-04 ENCOUNTER — Encounter: Payer: Medicaid Other | Admitting: Obstetrics & Gynecology

## 2020-04-04 ENCOUNTER — Encounter: Payer: Medicaid Other | Admitting: Certified Nurse Midwife

## 2020-04-04 ENCOUNTER — Other Ambulatory Visit: Payer: Self-pay

## 2020-04-04 ENCOUNTER — Ambulatory Visit (INDEPENDENT_AMBULATORY_CARE_PROVIDER_SITE_OTHER): Payer: Medicaid Other

## 2020-04-04 ENCOUNTER — Ambulatory Visit (INDEPENDENT_AMBULATORY_CARE_PROVIDER_SITE_OTHER): Payer: Medicaid Other | Admitting: Licensed Clinical Social Worker

## 2020-04-04 DIAGNOSIS — Z3A00 Weeks of gestation of pregnancy not specified: Secondary | ICD-10-CM | POA: Diagnosis not present

## 2020-04-04 DIAGNOSIS — F32A Depression, unspecified: Secondary | ICD-10-CM

## 2020-04-04 DIAGNOSIS — O99340 Other mental disorders complicating pregnancy, unspecified trimester: Secondary | ICD-10-CM

## 2020-04-04 DIAGNOSIS — O09892 Supervision of other high risk pregnancies, second trimester: Secondary | ICD-10-CM

## 2020-04-04 DIAGNOSIS — F329 Major depressive disorder, single episode, unspecified: Secondary | ICD-10-CM | POA: Diagnosis not present

## 2020-04-04 MED ORDER — HYDROXYPROGESTERONE CAPROATE 275 MG/1.1ML ~~LOC~~ SOAJ
275.0000 mg | Freq: Once | SUBCUTANEOUS | Status: AC
  Administered 2020-04-04: 275 mg via SUBCUTANEOUS

## 2020-04-04 NOTE — Progress Notes (Signed)
Patient was assessed and managed by nursing staff during this encounter. I have reviewed the chart and agree with the documentation and plan.  Sharyon Cable, CNM 04/04/2020 4:00 PM

## 2020-04-04 NOTE — Progress Notes (Signed)
SUBJECTIVE OB  Presents for 17P Injection, given in LA, tolerated well.  PLAN Return in one week for OB visit and 17P  Administrations This Visit    HYDROXYprogesterone caproate (Makena) autoinjector 275 mg    Admin Date 04/04/2020 Action Given Dose 275 mg Route Subcutaneous Administered By Maretta Bees, RMA

## 2020-04-04 NOTE — BH Specialist Note (Signed)
Integrated Behavioral Health Follow Up Visit  MRN: [id number] Name: Sandra Matthews  Number of Integrated Behavioral Health Clinician visits: 2 Session Start time: 2:38pm  Session End time: 3:18pm Total time: 40 mins face to face at [facility]   Type of Service: Integrated Behavioral Health- Individual Interpretor:no  Interpretor Name and Language: none  SUBJECTIVE: Sandra Matthews is a 24 y.o. female accompanied by n/a Patient was referred by Dr. Alysia Penna  for depression. Patient reports the following symptoms/concerns: depression  Duration of problem: one year ; Severity of problem: mild   OBJECTIVE: Mood:good   and Affect: normal  Risk of harm to self or others: no risk of harm to self or others   LIFE CONTEXT: Family and Social: lives with children in [place]  School/Work: n/a Self-Care: na/ Life Changes: na/  GOALS ADDRESSED: Patient will: 1.  Reduce symptoms of:  Depression  2.  Increase knowledge and/or ability of:   3.  Demonstrate ability to: self manage symptoms   INTERVENTIONS: Interventions utilized:  Case management and supportive counseling  Standardized Assessments completed: n/a   ASSESSMENT: Patient currently experiencing depression affecting pregnancy. Assist Ms. Whitworth apply for college class and discuss how mood impacted effective decision making. Ms. Coltrane will continue use mindfulness techniques to alleviate depression symptoms and start journal to identify emotions and triggers.   Patient may benefit from integrated behavioral health   PLAN: 1. Follow up with behavioral health clinician on : 05/02/2020 2. Behavioral recommendations: Utilize mindfulness technique to alleviate depression symptoms and follow up with [facility] regarding going back to school  3. Referral(s): [facility] 4. "From scale of 1-10, how likely are you to follow plan?":   Gwyndolyn Saxon, LCSW

## 2020-04-12 ENCOUNTER — Encounter: Payer: Medicaid Other | Admitting: Obstetrics & Gynecology

## 2020-04-18 ENCOUNTER — Ambulatory Visit (INDEPENDENT_AMBULATORY_CARE_PROVIDER_SITE_OTHER): Payer: Medicaid Other | Admitting: Obstetrics and Gynecology

## 2020-04-18 ENCOUNTER — Other Ambulatory Visit: Payer: Self-pay

## 2020-04-18 VITALS — BP 104/68 | HR 108 | Wt 119.0 lb

## 2020-04-18 DIAGNOSIS — O361920 Maternal care for other isoimmunization, second trimester, not applicable or unspecified: Secondary | ICD-10-CM

## 2020-04-18 DIAGNOSIS — IMO0001 Reserved for inherently not codable concepts without codable children: Secondary | ICD-10-CM

## 2020-04-18 DIAGNOSIS — O361990 Maternal care for other isoimmunization, unspecified trimester, not applicable or unspecified: Secondary | ICD-10-CM

## 2020-04-18 DIAGNOSIS — Z8759 Personal history of other complications of pregnancy, childbirth and the puerperium: Secondary | ICD-10-CM

## 2020-04-18 DIAGNOSIS — Z3A23 23 weeks gestation of pregnancy: Secondary | ICD-10-CM | POA: Diagnosis not present

## 2020-04-18 DIAGNOSIS — O09212 Supervision of pregnancy with history of pre-term labor, second trimester: Secondary | ICD-10-CM | POA: Diagnosis not present

## 2020-04-18 DIAGNOSIS — O0990 Supervision of high risk pregnancy, unspecified, unspecified trimester: Secondary | ICD-10-CM

## 2020-04-18 DIAGNOSIS — O09892 Supervision of other high risk pregnancies, second trimester: Secondary | ICD-10-CM

## 2020-04-18 DIAGNOSIS — F1290 Cannabis use, unspecified, uncomplicated: Secondary | ICD-10-CM

## 2020-04-18 DIAGNOSIS — O99322 Drug use complicating pregnancy, second trimester: Secondary | ICD-10-CM

## 2020-04-18 MED ORDER — HYDROXYPROGESTERONE CAPROATE 275 MG/1.1ML ~~LOC~~ SOAJ
275.0000 mg | Freq: Once | SUBCUTANEOUS | Status: AC
  Administered 2020-04-18: 275 mg via SUBCUTANEOUS

## 2020-04-18 NOTE — Patient Instructions (Signed)

## 2020-04-18 NOTE — Progress Notes (Signed)
   PRENATAL VISIT NOTE  Subjective:  Sandra Matthews is a 24 y.o. [phone] at [redacted]w[redacted]d being seen today for ongoing prenatal care.  She is currently monitored for the following issues for this high-risk pregnancy and has History of preterm delivery, currently pregnant in second trimester; History of preterm premature rupture of membranes (PPROM); Anti-M isoimmunization affecting pregnancy, antepartum; Marijuana use; Supervision of high risk pregnancy, antepartum; and Depression affecting pregnancy on their problem list.  Patient doing well with no acute concerns today. She reports no complaints.  Contractions: Not present. Vag. Bleeding: None.  Movement: Present. Denies leaking of fluid.  Pt was concerned about continuing Makena due to headaches; however, she has taken it without issue in 2 previous pregnancies and has only noted the headache once with this pregnancy.  Pt does note relate any s/sx of preterm labor.  The following portions of the patient's history were reviewed and updated as appropriate: allergies, current medications, past family history, past medical history, past social history, past surgical history and problem list. Problem list updated.  Objective:   Vitals:   04/18/20 1438  BP: 104/68  Pulse: (!) 108  Weight: 119 lb (54 kg)    Fetal Status: Fetal Heart Rate (bpm): 145   Movement: Present     General:  Alert, oriented and cooperative. Patient is in no acute distress.  Skin: Skin is warm and dry. No rash noted.   Cardiovascular: Normal heart rate noted  Respiratory: Normal respiratory effort, no problems with respiration noted  Abdomen: Soft, gravid, appropriate for gestational age.  Pain/Pressure: Absent     Pelvic: Cervical exam deferred        Extremities: Normal range of motion.  Edema: None  Mental Status:  Normal mood and affect. Normal behavior. Normal judgment and thought content.   Assessment and Plan:  Pregnancy: G4P2103 at [redacted]w[redacted]d  1. History of  preterm delivery, currently pregnant in second trimester Continue makena and observe for any side effects  2. History of preterm premature rupture of membranes (PPROM)   3. Anti-M isoimmunization affecting pregnancy, antepartum, single or unspecified fetus   4. Supervision of high risk pregnancy, antepartum F/u in 4 weeks with 28 week labs  5. Marijuana use   Preterm labor symptoms and general obstetric precautions including but not limited to vaginal bleeding, contractions, leaking of fluid and fetal movement were reviewed in detail with the patient.  Please refer to After Visit Summary for other counseling recommendations.   Return in about 4 weeks (around 05/16/2020) for ROB, in person, 26-28 weeks labs.   Mariel Aloe, MD

## 2020-04-18 NOTE — Progress Notes (Signed)
ROB/17P.  Patient c/o 17P injection giving her a headache and would like to discuss with MD before getting her shot today.

## 2020-04-20 DIAGNOSIS — Z419 Encounter for procedure for purposes other than remedying health state, unspecified: Secondary | ICD-10-CM | POA: Diagnosis not present

## 2020-04-25 ENCOUNTER — Other Ambulatory Visit: Payer: Self-pay

## 2020-04-25 ENCOUNTER — Inpatient Hospital Stay (HOSPITAL_COMMUNITY)
Admission: AD | Admit: 2020-04-25 | Discharge: 2020-04-26 | Disposition: A | Discharge status: Home / Self Care | Payer: Medicaid Other | Attending: Obstetrics and Gynecology | Admitting: Obstetrics and Gynecology

## 2020-04-25 ENCOUNTER — Ambulatory Visit: Payer: Medicaid Other

## 2020-04-25 ENCOUNTER — Encounter (HOSPITAL_COMMUNITY): Payer: Self-pay | Admitting: Obstetrics and Gynecology

## 2020-04-25 DIAGNOSIS — O99612 Diseases of the digestive system complicating pregnancy, second trimester: Secondary | ICD-10-CM

## 2020-04-25 DIAGNOSIS — O212 Late vomiting of pregnancy: Secondary | ICD-10-CM | POA: Insufficient documentation

## 2020-04-25 DIAGNOSIS — E876 Hypokalemia: Secondary | ICD-10-CM | POA: Diagnosis not present

## 2020-04-25 DIAGNOSIS — O99282 Endocrine, nutritional and metabolic diseases complicating pregnancy, second trimester: Secondary | ICD-10-CM | POA: Insufficient documentation

## 2020-04-25 DIAGNOSIS — O99342 Other mental disorders complicating pregnancy, second trimester: Secondary | ICD-10-CM | POA: Insufficient documentation

## 2020-04-25 DIAGNOSIS — R197 Diarrhea, unspecified: Secondary | ICD-10-CM | POA: Insufficient documentation

## 2020-04-25 DIAGNOSIS — O99112 Other diseases of the blood and blood-forming organs and certain disorders involving the immune mechanism complicating pregnancy, second trimester: Secondary | ICD-10-CM | POA: Insufficient documentation

## 2020-04-25 DIAGNOSIS — F329 Major depressive disorder, single episode, unspecified: Secondary | ICD-10-CM | POA: Diagnosis not present

## 2020-04-25 DIAGNOSIS — Z87891 Personal history of nicotine dependence: Secondary | ICD-10-CM | POA: Insufficient documentation

## 2020-04-25 DIAGNOSIS — Z3A24 24 weeks gestation of pregnancy: Secondary | ICD-10-CM | POA: Insufficient documentation

## 2020-04-25 DIAGNOSIS — O99891 Other specified diseases and conditions complicating pregnancy: Secondary | ICD-10-CM | POA: Diagnosis not present

## 2020-04-25 DIAGNOSIS — Z79899 Other long term (current) drug therapy: Secondary | ICD-10-CM | POA: Diagnosis not present

## 2020-04-25 DIAGNOSIS — K529 Noninfective gastroenteritis and colitis, unspecified: Secondary | ICD-10-CM

## 2020-04-25 DIAGNOSIS — E860 Dehydration: Secondary | ICD-10-CM | POA: Diagnosis not present

## 2020-04-25 DIAGNOSIS — D72829 Elevated white blood cell count, unspecified: Secondary | ICD-10-CM | POA: Insufficient documentation

## 2020-04-25 LAB — COMPREHENSIVE METABOLIC PANEL
ALT: 10 U/L (ref 0–44)
AST: 12 U/L — ABNORMAL LOW (ref 15–41)
Albumin: 2.7 g/dL — ABNORMAL LOW (ref 3.5–5.0)
Alkaline Phosphatase: 58 U/L (ref 38–126)
Anion gap: 11 (ref 5–15)
BUN: 8 mg/dL (ref 6–20)
CO2: 18 mmol/L — ABNORMAL LOW (ref 22–32)
Calcium: 8.8 mg/dL — ABNORMAL LOW (ref 8.9–10.3)
Chloride: 105 mmol/L (ref 98–111)
Creatinine, Ser: 0.44 mg/dL (ref 0.44–1.00)
GFR calc Af Amer: >60 mL/min (ref >60)
GFR calc non Af Amer: >60 mL/min (ref >60)
Glucose, Bld: 90 mg/dL (ref 70–99)
Potassium: 3.3 mmol/L — ABNORMAL LOW (ref 3.5–5.1)
Sodium: 134 mmol/L — ABNORMAL LOW (ref 135–145)
Total Bilirubin: 0.4 mg/dL (ref 0.3–1.2)
Total Protein: 6 g/dL — ABNORMAL LOW (ref 6.5–8.1)

## 2020-04-25 LAB — URINALYSIS, ROUTINE W REFLEX MICROSCOPIC
Bilirubin Urine: NEGATIVE
Glucose, UA: NEGATIVE mg/dL
Hgb urine dipstick: NEGATIVE
Ketones, ur: 80 mg/dL — AB
Leukocytes,Ua: NEGATIVE
Nitrite: NEGATIVE
Protein, ur: 30 mg/dL — AB
Specific Gravity, Urine: 1.028 (ref 1.005–1.030)
pH: 5 (ref 5.0–8.0)

## 2020-04-25 LAB — CBC
HCT: 30.7 % — ABNORMAL LOW (ref 36.0–46.0)
Hemoglobin: 10.5 g/dL — ABNORMAL LOW (ref 12.0–15.0)
MCH: 32 pg (ref 26.0–34.0)
MCHC: 34.2 g/dL (ref 30.0–36.0)
MCV: 93.6 fL (ref 80.0–100.0)
Platelets: 383 10*3/uL (ref 150–400)
RBC: 3.28 MIL/uL — ABNORMAL LOW (ref 3.87–5.11)
RDW: 12.4 % (ref 11.5–15.5)
WBC: 15.1 10*3/uL — ABNORMAL HIGH (ref 4.0–10.5)
nRBC: 0 % (ref 0.0–0.2)

## 2020-04-25 MED ORDER — LACTATED RINGERS IV SOLN: INTRAVENOUS | Status: DC

## 2020-04-25 MED ORDER — LACTATED RINGERS IV SOLN: Freq: Once | INTRAVENOUS | Status: DC

## 2020-04-25 MED ORDER — POTASSIUM CHLORIDE 10 MEQ/100ML IV SOLN
10.0000 meq | Freq: Once | INTRAVENOUS | Status: AC
  Administered 2020-04-25: 10 meq via INTRAVENOUS
  Filled 2020-04-25: qty 100

## 2020-04-25 MED ORDER — LACTATED RINGERS IV SOLN: Freq: Once | INTRAVENOUS | Status: AC

## 2020-04-25 MED ORDER — PROMETHAZINE HCL 25 MG/ML IJ SOLN
12.5000 mg | Freq: Once | INTRAMUSCULAR | Status: AC
  Administered 2020-04-25: 12.5 mg via INTRAVENOUS
  Filled 2020-04-25: qty 1
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
## 2020-04-25 NOTE — MAU Note (Signed)
Pt here with nausea, vomiting, and diarrhea that started today. Has thrown up about 8 times today and had 3-4 episodes of loose, watery diarrhea. Reports her "body feels tired." Pt denies fever. Reports dad has had diarrhea recently, but no vomiting. She reports that she did eat spam for breakfast and has felt sick since then. Pt denies pain.

## 2020-04-26 ENCOUNTER — Ambulatory Visit: Payer: Medicaid Other

## 2020-04-26 ENCOUNTER — Ambulatory Visit: Payer: Medicaid Other | Attending: Obstetrics and Gynecology

## 2020-04-26 MED ORDER — ONDANSETRON 4 MG PO TBDP
4.0000 mg | ORAL_TABLET | Freq: Four times a day (QID) | ORAL | 0 refills | Status: DC | PRN
Start: 2020-04-26 — End: 2020-05-16

## 2020-04-26 NOTE — Discharge Instructions (Signed)
Viral Gastroenteritis, Adult  Viral gastroenteritis is also known as the stomach flu. This condition may affect your stomach, small intestine, and large intestine. It can cause sudden watery diarrhea, fever, and vomiting. This condition is caused by many different viruses. These viruses can be passed from person to person very easily (are contagious). Diarrhea and vomiting can make you feel weak and cause you to become dehydrated. You may not be able to keep fluids down. Dehydration can make you tired and thirsty, cause you to have a dry mouth, and decrease how often you urinate. It is important to replace the fluids that you lose from diarrhea and vomiting. What are the causes? Gastroenteritis is caused by many viruses, including rotavirus and norovirus. Norovirus is the most common cause in adults. You can get sick after being exposed to the viruses from other people. You can also get sick by:  Eating food, drinking water, or touching a surface contaminated with one of these viruses.  Sharing utensils or other personal items with an infected person. What increases the risk? You are more likely to develop this condition if you:  Have a weak body defense system (immune system).  Live with one or more children who are younger than 2 years old.  Live in a nursing home.  Travel on cruise ships. What are the signs or symptoms? Symptoms of this condition start suddenly 1-3 days after exposure to a virus. Symptoms may last for a few days or for as long as a week. Common symptoms include watery diarrhea and vomiting. Other symptoms include:  Fever.  Headache.  Fatigue.  Pain in the abdomen.  Chills.  Weakness.  Nausea.  Muscle aches.  Loss of appetite. How is this diagnosed? This condition is diagnosed with a medical history and physical exam. You may also have a stool test to check for viruses or other infections. How is this treated? This condition typically goes away on its  own. The focus of treatment is to prevent dehydration and restore lost fluids (rehydration). This condition may be treated with:  An oral rehydration solution (ORS) to replace important salts and minerals (electrolytes) in your body. Take this if told by your health care provider. This is a drink that is sold at pharmacies and retail stores.  Medicines to help with your symptoms.  Probiotic supplements to reduce symptoms of diarrhea.  Fluids given through an IV, if dehydration is severe. Older adults and people with other diseases or a weak immune system are at higher risk for dehydration. Follow these instructions at home:  Eating and drinking   Take an ORS as told by your health care provider.  Drink clear fluids in small amounts as you are able. Clear fluids include: ? Water. ? Ice chips. ? Diluted fruit juice. ? Low-calorie sports drinks.  Drink enough fluid to keep your urine pale yellow.  Eat small amounts of healthy foods every 3-4 hours as you are able. This may include whole grains, fruits, vegetables, lean meats, and yogurt.  Avoid fluids that contain a lot of sugar or caffeine, such as energy drinks, sports drinks, and soda.  Avoid spicy or fatty foods.  Avoid alcohol. General instructions  Wash your hands often, especially after having diarrhea or vomiting. If soap and water are not available, use hand sanitizer.  Make sure that all people in your household wash their hands well and often.  Take over-the-counter and prescription medicines only as told by your health care provider.  Rest at   home while you recover.  Watch your condition for any changes.  Take a warm bath to relieve any burning or pain from frequent diarrhea episodes.  Keep all follow-up visits as told by your health care provider. This is important. Contact a health care provider if you:  Cannot keep fluids down.  Have symptoms that get worse.  Have new symptoms.  Feel light-headed or  dizzy.  Have muscle cramps. Get help right away if you:  Have chest pain.  Feel extremely weak or you faint.  See blood in your vomit.  Have vomit that looks like coffee grounds.  Have bloody or black stools or stools that look like tar.  Have a severe headache, a stiff neck, or both.  Have a rash.  Have severe pain, cramping, or bloating in your abdomen.  Have trouble breathing or you are breathing very quickly.  Have a fast heartbeat.  Have skin that feels cold and clammy.  Feel confused.  Have pain when you urinate.  Have signs of dehydration, such as: ? Dark urine, very little urine, or no urine. ? Cracked lips. ? Dry mouth. ? Sunken eyes. ? Sleepiness. ? Weakness. Summary  Viral gastroenteritis is also known as the stomach flu. It can cause sudden watery diarrhea, fever, and vomiting.  This condition can be passed from person to person very easily (is contagious).  Take an ORS if told by your health care provider. This is a drink that is sold at pharmacies and retail stores.  Wash your hands often, especially after having diarrhea or vomiting. If soap and water are not available, use hand sanitizer. This information is not intended to replace advice given to you by your health care provider. Make sure you discuss any questions you have with your health care provider. Document Revised: 03/26/2019 Document Reviewed: 08/12/2018 Elsevier Patient Education  2020 [employer]  

## 2020-05-02 ENCOUNTER — Ambulatory Visit (INDEPENDENT_AMBULATORY_CARE_PROVIDER_SITE_OTHER): Payer: Medicaid Other | Admitting: Licensed Clinical Social Worker

## 2020-05-02 DIAGNOSIS — Z5329 Procedure and treatment not carried out because of patient's decision for other reasons: Secondary | ICD-10-CM

## 2020-05-02 DIAGNOSIS — Z91199 Patient's noncompliance with other medical treatment and regimen due to unspecified reason: Secondary | ICD-10-CM

## 2020-05-03 NOTE — BH Specialist Note (Signed)
Pt no show visit  

## 2020-05-05 ENCOUNTER — Other Ambulatory Visit: Payer: Self-pay

## 2020-05-05 ENCOUNTER — Other Ambulatory Visit: Payer: Self-pay | Admitting: Obstetrics

## 2020-05-05 ENCOUNTER — Ambulatory Visit: Payer: Medicaid Other | Attending: Obstetrics and Gynecology

## 2020-05-05 ENCOUNTER — Ambulatory Visit: Payer: Medicaid Other | Admitting: *Deleted

## 2020-05-05 DIAGNOSIS — O0990 Supervision of high risk pregnancy, unspecified, unspecified trimester: Secondary | ICD-10-CM | POA: Insufficient documentation

## 2020-05-05 DIAGNOSIS — O09212 Supervision of pregnancy with history of pre-term labor, second trimester: Secondary | ICD-10-CM

## 2020-05-05 DIAGNOSIS — O2692 Pregnancy related conditions, unspecified, second trimester: Secondary | ICD-10-CM | POA: Diagnosis not present

## 2020-05-05 DIAGNOSIS — Z362 Encounter for other antenatal screening follow-up: Secondary | ICD-10-CM | POA: Diagnosis not present

## 2020-05-05 DIAGNOSIS — Z3A25 25 weeks gestation of pregnancy: Secondary | ICD-10-CM | POA: Diagnosis not present

## 2020-05-05 DIAGNOSIS — O09892 Supervision of other high risk pregnancies, second trimester: Secondary | ICD-10-CM | POA: Insufficient documentation

## 2020-05-05 DIAGNOSIS — O09292 Supervision of pregnancy with other poor reproductive or obstetric history, second trimester: Secondary | ICD-10-CM

## 2020-05-06 ENCOUNTER — Encounter (HOSPITAL_COMMUNITY): Payer: Self-pay | Admitting: Obstetrics & Gynecology

## 2020-05-06 ENCOUNTER — Other Ambulatory Visit: Payer: Self-pay

## 2020-05-06 ENCOUNTER — Inpatient Hospital Stay (HOSPITAL_COMMUNITY)
Admission: AD | Admit: 2020-05-06 | Discharge: 2020-05-06 | Disposition: A | Discharge status: Home / Self Care | Payer: Medicaid Other | Attending: Obstetrics & Gynecology | Admitting: Obstetrics & Gynecology

## 2020-05-06 DIAGNOSIS — O26893 Other specified pregnancy related conditions, third trimester: Secondary | ICD-10-CM

## 2020-05-06 DIAGNOSIS — F329 Major depressive disorder, single episode, unspecified: Secondary | ICD-10-CM | POA: Insufficient documentation

## 2020-05-06 DIAGNOSIS — Z3689 Encounter for other specified antenatal screening: Secondary | ICD-10-CM

## 2020-05-06 DIAGNOSIS — O26892 Other specified pregnancy related conditions, second trimester: Secondary | ICD-10-CM | POA: Insufficient documentation

## 2020-05-06 DIAGNOSIS — O0990 Supervision of high risk pregnancy, unspecified, unspecified trimester: Secondary | ICD-10-CM

## 2020-05-06 DIAGNOSIS — O99342 Other mental disorders complicating pregnancy, second trimester: Secondary | ICD-10-CM | POA: Diagnosis not present

## 2020-05-06 DIAGNOSIS — R42 Dizziness and giddiness: Secondary | ICD-10-CM

## 2020-05-06 DIAGNOSIS — Z87891 Personal history of nicotine dependence: Secondary | ICD-10-CM | POA: Diagnosis not present

## 2020-05-06 DIAGNOSIS — Z3A25 25 weeks gestation of pregnancy: Secondary | ICD-10-CM

## 2020-05-06 DIAGNOSIS — Z79899 Other long term (current) drug therapy: Secondary | ICD-10-CM | POA: Insufficient documentation

## 2020-05-06 DIAGNOSIS — R519 Headache, unspecified: Secondary | ICD-10-CM

## 2020-05-06 DIAGNOSIS — G8929 Other chronic pain: Secondary | ICD-10-CM

## 2020-05-06 LAB — URINALYSIS, ROUTINE W REFLEX MICROSCOPIC
Bacteria, UA: NONE SEEN
Bilirubin Urine: NEGATIVE
Glucose, UA: NEGATIVE mg/dL
Hgb urine dipstick: NEGATIVE
Ketones, ur: 20 mg/dL — AB
Leukocytes,Ua: NEGATIVE
Nitrite: NEGATIVE
Protein, ur: 30 mg/dL — AB
Specific Gravity, Urine: 1.028 (ref 1.005–1.030)
pH: 5 (ref 5.0–8.0)

## 2020-05-06 LAB — GLUCOSE, CAPILLARY: Glucose-Capillary: 100 mg/dL — ABNORMAL HIGH (ref 70–99)

## 2020-05-06 MED ORDER — BUTALBITAL-APAP-CAFFEINE 50-325-40 MG PO TABS: 1.0000 | ORAL_TABLET | Freq: Four times a day (QID) | ORAL | 0 refills | Status: DC | PRN

## 2020-05-06 MED ORDER — BUTALBITAL-APAP-CAFFEINE 50-325-40 MG PO TABS
2.0000 | ORAL_TABLET | Freq: Once | ORAL | Status: AC
  Administered 2020-05-06: 2 via ORAL
  Filled 2020-05-06: qty 2

## 2020-05-06 MED ORDER — FERROUS SULFATE 325 (65 FE) MG PO TBEC
325.0000 mg | DELAYED_RELEASE_TABLET | Freq: Every day | ORAL | 3 refills | Status: DC
Start: 2020-05-06 — End: 2022-10-23

## 2020-05-06 NOTE — MAU Note (Signed)
Pt states that she saw in MyChart that she had protein in urine.  She then went to U/S and was told about her antibodies so she wanted to get checked.  Pt also reports that she has had dizziness and reports that she drinks plenty of water.  +FM

## 2020-05-06 NOTE — Discharge Instructions (Signed)
Iron-Rich Diet  Iron is a mineral that helps your body to produce hemoglobin. Hemoglobin is a protein in red blood cells that carries oxygen to your body's tissues. Eating too little iron may cause you to feel weak and tired, and it can increase your risk of infection. Iron is naturally found in many foods, and many foods have iron added to them (iron-fortified foods). You may need to follow an iron-rich diet if you do not have enough iron in your body due to certain medical conditions. The amount of iron that you need each day depends on your age, your sex, and any medical conditions you have. Follow instructions from your health care provider or a diet and nutrition specialist (dietitian) about how much iron you should eat each day. What are tips for following this plan? Reading food labels  Check food labels to see how many milligrams (mg) of iron are in each serving. Cooking  Cook foods in pots and pans that are made from iron.  Take these steps to make it easier for your body to absorb iron from certain foods: ? Soak beans overnight before cooking. ? Soak whole grains overnight and drain them before using. ? Ferment flours before baking, such as by using yeast in bread dough. Meal planning  When you eat foods that contain iron, you should eat them with foods that are high in vitamin C. These include oranges, peppers, tomatoes, potatoes, and mango. Vitamin C helps your body to absorb iron. General information  Take iron supplements only as told by your health care provider. An overdose of iron can be life-threatening. If you were prescribed iron supplements, take them with orange juice or a vitamin C supplement.  When you eat iron-fortified foods or take an iron supplement, you should also eat foods that naturally contain iron, such as meat, poultry, and fish. Eating naturally iron-rich foods helps your body to absorb the iron that is added to other foods or contained in a  supplement.  Certain foods and drinks prevent your body from absorbing iron properly. Avoid eating these foods in the same meal as iron-rich foods or with iron supplements. These foods include: ? Coffee, black tea, and red wine. ? Milk, dairy products, and foods that are high in calcium. ? Beans and soybeans. ? Whole grains. What foods should I eat? Fruits Prunes. Raisins. Eat fruits high in vitamin C, such as oranges, grapefruits, and strawberries, alongside iron-rich foods. Vegetables Spinach (cooked). Green peas. Broccoli. Fermented vegetables. Eat vegetables high in vitamin C, such as leafy greens, potatoes, bell peppers, and tomatoes, alongside iron-rich foods. Grains Iron-fortified breakfast cereal. Iron-fortified whole-wheat bread. Enriched rice. Sprouted grains. Meats and other proteins Beef liver. Oysters. Beef. Shrimp. [place]. Chicken. [place]. Sardines. Chickpeas. Nuts. Tofu. Pumpkin seeds. Beverages Tomato juice. Fresh orange juice. Prune juice. Hibiscus tea. Fortified instant breakfast shakes. Sweets and desserts Blackstrap molasses. Seasonings and condiments Tahini. Fermented soy sauce. Other foods Wheat germ. The items listed above may not be a complete list of recommended foods and beverages. Contact a dietitian for more information. What foods should I avoid? Grains Whole grains. Bran cereal. Bran flour. Oats. Meats and other proteins Soybeans. Products made from soy protein. Black beans. Lentils. Mung beans. Split peas. Dairy Milk. Cream. Cheese. Yogurt. Cottage cheese. Beverages Coffee. Black tea. Red wine. Sweets and desserts Cocoa. Chocolate. Ice cream. Other foods Basil. Oregano. Large amounts of parsley. The items listed above may not be a complete list of foods and beverages to avoid.  Contact a dietitian for more information. Summary  Iron is a mineral that helps your body to produce hemoglobin. Hemoglobin is a protein in red blood cells that carries  oxygen to your body's tissues.  Iron is naturally found in many foods, and many foods have iron added to them (iron-fortified foods).  When you eat foods that contain iron, you should eat them with foods that are high in vitamin C. Vitamin C helps your body to absorb iron.  Certain foods and drinks prevent your body from absorbing iron properly, such as whole grains and dairy products. You should avoid eating these foods in the same meal as iron-rich foods or with iron supplements. This information is not intended to replace advice given to you by your health care provider. Make sure you discuss any questions you have with your health care provider. Document Revised: 09/19/2017 Document Reviewed: 09/02/2017 Elsevier Patient Education  2020 [employer]. Protein Content in Foods  Generally, most healthy people need around 50 grams of protein each day. Depending on your overall health, you may need more or less protein in your diet. Talk to your health care provider or dietitian about how much protein you need. See the following list for the protein content of some common foods. High-protein foods High-protein foods contain 4 grams (4 g) or more of protein per serving. They include:  Beef, ground sirloin (cooked) -- 3 oz have 24 g of protein.  Cheese (hard) -- 1 oz has 7 g of protein.  Chicken breast, boneless and skinless (cooked) -- 3 oz have 13.4 g of protein.  Cottage cheese -- 1/2 cup has 13.4 g of protein.  Egg -- 1 egg has 6 g of protein.  Fish, filet (cooked) -- 1 oz has 6-7 g of protein.  Garbanzo beans (canned or cooked) -- 1/2 cup has 6-7 g of protein.  Kidney beans (canned or cooked) -- 1/2 cup has 6-7 g of protein.  Lamb (cooked) -- 3 oz has 24 g of protein.  Milk -- 1 cup (8 oz) has 8 g of protein.  Nuts (peanuts, pistachios, almonds) -- 1 oz has 6 g of protein.  Peanut butter -- 1 oz has 7-8 g of protein.  Pork tenderloin (cooked) -- 3 oz has 18.4 g of  protein.  Pumpkin seeds -- 1 oz has 8.5 g of protein.  Soybeans (roasted) -- 1 oz has 8 g of protein.  Soybeans (cooked) -- 1/2 cup has 11 g of protein.  Soy milk -- 1 cup (8 oz) has 5-10 g of protein.  Soy or vegetable patty -- 1 patty has 11 g of protein.  Sunflower seeds -- 1 oz has 5.5 g of protein.  Tofu (firm) -- 1/2 cup has 20 g of protein.  Tuna (canned in water) -- 3 oz has 20 g of protein.  Yogurt -- 6 oz has 8 g of protein. Low-protein foods Low-protein foods contain 3 grams (3 g) or less of protein per serving. They include:  Beets (raw or cooked) -- 1/2 cup has 1.5 g of protein.  Bran cereal -- 1/2 cup has 2-3 g of protein.  Bread -- 1 slice has 2.5 g of protein.  Broccoli (raw or cooked) -- 1/2 cup has 2 g of protein.  Collard greens (raw or cooked) -- 1/2 cup has 2 g of protein.  Corn (fresh or cooked) -- 1/2 cup has 2 g of protein.  Cream cheese -- 1 oz has 2 g of protein.  Creamer (half-and-half) -- 1 oz has 1 g of protein.  Flour tortilla -- 1 tortilla has 2.5 g of protein  Frozen yogurt -- 1/2 cup has 3 g of protein.  Fruit or vegetable juice -- 1/2 cup has 1 g of protein.  Green beans (raw or cooked) -- 1/2 cup has 1 g of protein.  Green peas (canned) -- 1/2 cup has 3.5 g of protein.  Muffins -- 1 small muffin (2 oz) has 3 g of protein.  Oatmeal (cooked) -- 1/2 cup has 3 g of protein.  Potato (baked with skin) -- 1 medium potato has 3 g of protein.  Rice (cooked) -- 1/2 cup has 2.5-3.5 g of protein.  Sour cream -- 1/2 cup has 2.5 g of protein.  Spinach (cooked) -- 1/2 cup has 3 g of protein.  Squash (cooked) -- 1/2 cup has 1.5 g of protein. Actual amounts of protein may be different depending on processing. Talk with your health care provider or dietitian about what foods are recommended for you. This information is not intended to replace advice given to you by your health care provider. Make sure you discuss any questions you have  with your health care provider. Document Revised: 06/17/2016 Document Reviewed: 06/17/2016 Elsevier Patient Education  2020 [employer].

## 2020-05-06 NOTE — MAU Provider Note (Signed)
History     CSN: [id number]  Arrival date and time: 05/06/20 1601   First Provider Initiated Contact with Patient 05/06/20 2021      Chief Complaint  Patient presents with  . Dizziness  . Proteinuria   Sandra Matthews is a 24 y.o. [phone] at 62w6dwho receives care at [facility].  She presents today for Dizziness and HA.  Patient states she received her 17p shot and has been having HA.  Patient states that she has had a HA daily from the time she wakes up until she goes to sleep. Patient states she tries tylenol, but gets no relief and states she has stopped taking it.  Patient reports last dose was 2 days ago. Patient reports the headache is located in the frontal area and "sometimes moves to the back of my head."  Patient describes the pain as "throbbing" and rates it a 8/10.  Patient reports intermittent dizziness and states that when she "wakes up my body feels weak." Patient reports she has low iron, but is not taking any supplementation.  Patient with a history of depression, but denies current depressed mood.    Oatmeal with brown sugar Pepperoni pizza Breadsticks Water-2 bottles Cranberry juice:1 quart    OB History    Gravida  4   Para  3   Term  2   Preterm  1   AB  0   Living  3     SAB  0   TAB  0   Ectopic  0   Multiple  0   Live Births  3           Past Medical History:  Diagnosis Date  . Anemia   . Depression   . Preterm labor     Past Surgical History:  Procedure Laterality Date  . FRACTURE SURGERY     broken arm in childhood, cast only, no surgery    Family History  Problem Relation Age of Onset  . Bipolar disorder Father   . Depression Father   . Anxiety disorder Father   . Arthritis Father   . Diabetes Maternal Grandmother   . Hypertension Maternal Grandfather   . Depression Paternal Grandmother     Social History   Tobacco Use  . Smoking status: Former Smoker    Types: Cigarettes    Quit date: 10/22/2019     Years since quitting: 0.5  . Smokeless tobacco: Never Used  Vaping Use  . Vaping Use: Former  Substance Use Topics  . Alcohol use: No  . Drug use: Not Currently    Types: Marijuana    Comment: Last use 01/30/2020    Allergies: No Known Allergies  Medications Prior to Admission  Medication Sig Dispense Refill Last Dose  . Blood Pressure Monitoring (BLOOD PRESSURE KIT) DEVI 1 kit by Does not apply route once a week. Check Blood Pressure regularly and record readings into the [employer] App.  Large Cuff.  DX O90.0 1 each 0 Past Week at Unknown time  . ondansetron (ZOFRAN ODT) 4 MG disintegrating tablet Take 1 tablet (4 mg total) by mouth every 6 (six) hours as needed for nausea. 20 tablet 0 05/06/2020 at Unknown time  . Prenatal Vit-Fe Fum-Fe Bisg-FA (NATACHEW) 28-1 MG CHEW Chew 1 tablet by mouth daily. 30 tablet 12 05/06/2020 at Unknown time  . sertraline (ZOLOFT) 50 MG tablet Take 1 tablet (50 mg total) by mouth daily. Take 1/2 tablet qd x 7 days and then 1  whole tablet qd 30 tablet 5 05/06/2020 at Unknown time  . nitrofurantoin, macrocrystal-monohydrate, (MACROBID) 100 MG capsule Take 1 capsule (100 mg total) by mouth 2 (two) times daily. (Patient not taking: Reported on 03/28/2020) 14 capsule 1 More than a month at Unknown time  . promethazine (PHENERGAN) 25 MG tablet Take 1 tablet (25 mg total) by mouth every 6 (six) hours as needed for nausea or vomiting. (Patient not taking: Reported on 03/28/2020) 30 tablet 0 More than a month at Unknown time    Review of Systems  Constitutional: Negative for chills and fever.  Eyes: Negative for visual disturbance.  Respiratory: Negative for cough and shortness of breath.   Genitourinary: Negative for difficulty urinating, dysuria, vaginal bleeding and vaginal discharge.  Neurological: Positive for dizziness and headaches. Negative for light-headedness.   Physical Exam   Blood pressure (!) 118/59, pulse 92, temperature 98.4 F (36.9 C), temperature  source Oral, resp. rate 18, weight 53.1 kg, last menstrual period 11/07/2019, SpO2 100 %, unknown if currently breastfeeding.  Physical Exam Vitals reviewed.  Constitutional:      Appearance: Normal appearance.  HENT:     Head: Normocephalic and atraumatic.  Eyes:     Conjunctiva/sclera: Conjunctivae normal.  Cardiovascular:     Rate and Rhythm: Normal rate and regular rhythm.     Heart sounds: Normal heart sounds.  Pulmonary:     Effort: Pulmonary effort is normal. No respiratory distress.     Breath sounds: Normal breath sounds.  Abdominal:     General: Bowel sounds are normal. There is no distension.     Palpations: Abdomen is soft.     Tenderness: There is no abdominal tenderness.     Comments: Gravid--fundal height appears AGA, Soft, NT   Musculoskeletal:        General: Normal range of motion.  Skin:    General: Skin is warm and dry.  Neurological:     Mental Status: She is alert and oriented to person, place, and time.  Psychiatric:        Mood and Affect: Mood normal.        Behavior: Behavior normal.        Thought Content: Thought content normal.     Fetal Assessment 135 bpm, Mod Var, -Decels, +Accels Toco: No ctx graphed  MAU Course   Results for orders placed or performed during the hospital encounter of 05/06/20 (from the past 24 hour(s))  Urinalysis, Routine w reflex microscopic     Status: Abnormal   Collection Time: 05/06/20  8:15 PM  Result Value Ref Range   Color, Urine AMBER (A) YELLOW   APPearance HAZY (A) CLEAR   Specific Gravity, Urine 1.028 1.005 - 1.030   pH 5.0 5.0 - 8.0   Glucose, UA NEGATIVE NEGATIVE mg/dL   Hgb urine dipstick NEGATIVE NEGATIVE   Bilirubin Urine NEGATIVE NEGATIVE   Ketones, ur 20 (A) NEGATIVE mg/dL   Protein, ur 30 (A) NEGATIVE mg/dL   Nitrite NEGATIVE NEGATIVE   Leukocytes,Ua NEGATIVE NEGATIVE   RBC / HPF 0-5 0 - 5 RBC/hpf   WBC, UA 0-5 0 - 5 WBC/hpf   Bacteria, UA NONE SEEN NONE SEEN   Squamous Epithelial / LPF  11-20 0 - 5   Mucus PRESENT   Glucose, capillary     Status: Abnormal   Collection Time: 05/06/20  9:12 PM  Result Value Ref Range   Glucose-Capillary 100 (H) 70 - 99 mg/dL   No results found.  MDM PE  Labs:UA EFM  Assessment and Plan  24 year old [id number]  SIUP at 25.6weeks Cat I FT Dizziness  HA  -POC Reviewed. -Extensive discussion regarding proper nutritional and oral intake during pregnancy. *Discouraged heavy consumption of cocktail juices due to sugar content and encouraged increased water intake. *Discussed increasing protein in diet to maintain blood sugar levels. *Discussed how mild anemia could also contribute to symptoms.  -Information sheet provided for Iron and Protein Rich Diet. -Briefly discussed 17p injections and potential side effects.  Patient states she plans to discontinue. -Will obtain CBG and orthostatic blood pressures -Will provide oral hydration. -Discussed giving fioricet for HA now and sending home with script if responsive to therapy. -NST reactive and monitoring discontinued by provider. -Will monitor and reassess.   [name] MSN, CNM 05/06/2020, 8:21 PM   Reassessment (9:52 PM) -Nurse reports patient HA now 3/10. -Orthostatics reviewed and without significant variations. -CBG 100 -Provider to bedside and patient confirms and without further q/c. -Rx for iron supplement and fioricet sent to pharmacy on file.  -Instructed to keep next scheduled appt. -Encouraged to call or return to MAU if symptoms worsen or with the onset of new symptoms. -Discharged to home in improved condition.  [name] MSN, CNM Advanced Practice Provider, [facility]

## 2020-05-08 ENCOUNTER — Other Ambulatory Visit: Payer: Self-pay | Admitting: *Deleted

## 2020-05-08 DIAGNOSIS — O361930 Maternal care for other isoimmunization, third trimester, not applicable or unspecified: Secondary | ICD-10-CM

## 2020-05-16 ENCOUNTER — Inpatient Hospital Stay (HOSPITAL_COMMUNITY)
Admission: AD | Admit: 2020-05-16 | Discharge: 2020-05-16 | Disposition: A | Discharge status: Home / Self Care | Payer: Medicaid Other | Attending: Obstetrics and Gynecology | Admitting: Obstetrics and Gynecology

## 2020-05-16 ENCOUNTER — Encounter (HOSPITAL_COMMUNITY): Payer: Self-pay | Admitting: Obstetrics and Gynecology

## 2020-05-16 ENCOUNTER — Other Ambulatory Visit: Payer: Medicaid Other

## 2020-05-16 ENCOUNTER — Other Ambulatory Visit: Payer: Self-pay

## 2020-05-16 DIAGNOSIS — F329 Major depressive disorder, single episode, unspecified: Secondary | ICD-10-CM | POA: Insufficient documentation

## 2020-05-16 DIAGNOSIS — Z560 Unemployment, unspecified: Secondary | ICD-10-CM | POA: Diagnosis not present

## 2020-05-16 DIAGNOSIS — O210 Mild hyperemesis gravidarum: Secondary | ICD-10-CM

## 2020-05-16 DIAGNOSIS — O212 Late vomiting of pregnancy: Secondary | ICD-10-CM | POA: Diagnosis not present

## 2020-05-16 DIAGNOSIS — O99612 Diseases of the digestive system complicating pregnancy, second trimester: Secondary | ICD-10-CM | POA: Insufficient documentation

## 2020-05-16 DIAGNOSIS — O09212 Supervision of pregnancy with history of pre-term labor, second trimester: Secondary | ICD-10-CM | POA: Diagnosis not present

## 2020-05-16 DIAGNOSIS — K5900 Constipation, unspecified: Secondary | ICD-10-CM | POA: Diagnosis not present

## 2020-05-16 DIAGNOSIS — O0990 Supervision of high risk pregnancy, unspecified, unspecified trimester: Secondary | ICD-10-CM

## 2020-05-16 DIAGNOSIS — Z87891 Personal history of nicotine dependence: Secondary | ICD-10-CM | POA: Diagnosis not present

## 2020-05-16 DIAGNOSIS — Z3A27 27 weeks gestation of pregnancy: Secondary | ICD-10-CM | POA: Insufficient documentation

## 2020-05-16 DIAGNOSIS — O99342 Other mental disorders complicating pregnancy, second trimester: Secondary | ICD-10-CM | POA: Insufficient documentation

## 2020-05-16 DIAGNOSIS — Z79899 Other long term (current) drug therapy: Secondary | ICD-10-CM | POA: Insufficient documentation

## 2020-05-16 LAB — CBC
HCT: 32.7 % — ABNORMAL LOW (ref 36.0–46.0)
Hemoglobin: 10.5 g/dL — ABNORMAL LOW (ref 12.0–15.0)
MCH: 30.7 pg (ref 26.0–34.0)
MCHC: 32.1 g/dL (ref 30.0–36.0)
MCV: 95.6 fL (ref 80.0–100.0)
Platelets: 383 10*3/uL (ref 150–400)
RBC: 3.42 MIL/uL — ABNORMAL LOW (ref 3.87–5.11)
RDW: 12.5 % (ref 11.5–15.5)
WBC: 15 10*3/uL — ABNORMAL HIGH (ref 4.0–10.5)
nRBC: 0 % (ref 0.0–0.2)

## 2020-05-16 LAB — URINALYSIS, ROUTINE W REFLEX MICROSCOPIC
Bilirubin Urine: NEGATIVE
Glucose, UA: NEGATIVE mg/dL
Hgb urine dipstick: NEGATIVE
Ketones, ur: 80 mg/dL — AB
Leukocytes,Ua: NEGATIVE
Nitrite: NEGATIVE
Protein, ur: 30 mg/dL — AB
Specific Gravity, Urine: 1.027 (ref 1.005–1.030)
pH: 6 (ref 5.0–8.0)

## 2020-05-16 LAB — COMPREHENSIVE METABOLIC PANEL
ALT: 15 U/L (ref 0–44)
AST: 17 U/L (ref 15–41)
Albumin: 3 g/dL — ABNORMAL LOW (ref 3.5–5.0)
Alkaline Phosphatase: 75 U/L (ref 38–126)
Anion gap: 11 (ref 5–15)
BUN: 5 mg/dL — ABNORMAL LOW (ref 6–20)
CO2: 21 mmol/L — ABNORMAL LOW (ref 22–32)
Calcium: 9.1 mg/dL (ref 8.9–10.3)
Chloride: 103 mmol/L (ref 98–111)
Creatinine, Ser: 0.48 mg/dL (ref 0.44–1.00)
GFR calc Af Amer: >60 mL/min (ref >60)
GFR calc non Af Amer: >60 mL/min (ref >60)
Glucose, Bld: 91 mg/dL (ref 70–99)
Potassium: 3.6 mmol/L (ref 3.5–5.1)
Sodium: 135 mmol/L (ref 135–145)
Total Bilirubin: 0.5 mg/dL (ref 0.3–1.2)
Total Protein: 6.7 g/dL (ref 6.5–8.1)

## 2020-05-16 MED ORDER — SODIUM CHLORIDE 0.9 % IV SOLN
8.0000 mg | Freq: Once | INTRAVENOUS | Status: AC
  Administered 2020-05-16: 8 mg via INTRAVENOUS
  Filled 2020-05-16: qty 4

## 2020-05-16 MED ORDER — LACTATED RINGERS IV BOLUS
1000.0000 mL | Freq: Once | INTRAVENOUS | Status: AC
  Administered 2020-05-16: 1000 mL via INTRAVENOUS

## 2020-05-16 MED ORDER — ONDANSETRON 4 MG PO TBDP
4.0000 mg | ORAL_TABLET | Freq: Four times a day (QID) | ORAL | 1 refills | Status: DC | PRN
Start: 2020-05-16 — End: 2020-07-27

## 2020-05-16 NOTE — Discharge Instructions (Signed)
-zofran 4 mg every 8 hours as needed for nausea -ensure frequent small meals, every 2 hours approx -no marijuana use -ensure you are staying hydrated!  Hyperemesis Gravidarum Hyperemesis gravidarum is a severe form of nausea and vomiting that happens during pregnancy. Hyperemesis is worse than morning sickness. It may cause you to have nausea or vomiting all day for many days. It may keep you from eating and drinking enough food and liquids, which can lead to dehydration, malnutrition, and weight loss. Hyperemesis usually occurs during the first half (the first 20 weeks) of pregnancy. It often goes away once a woman is in her second half of pregnancy. However, sometimes hyperemesis continues through an entire pregnancy. What are the causes? The cause of this condition is not known. It may be related to changes in chemicals (hormones) in the body during pregnancy, such as the high level of pregnancy hormone (human chorionic gonadotropin) or the increase in the female sex hormone (estrogen). What are the signs or symptoms? Symptoms of this condition include:  Nausea that does not go away.  Vomiting that does not allow you to keep any food down.  Weight loss.  Body fluid loss (dehydration).  Having no desire to eat, or not liking food that you have previously enjoyed. How is this diagnosed? This condition may be diagnosed based on:  A physical exam.  Your medical history.  Your symptoms.  Blood tests.  Urine tests. How is this treated? This condition is managed by controlling symptoms. This may include:  Following an eating plan. This can help lessen nausea and vomiting.  Taking prescription medicines. An eating plan and medicines are often used together to help control symptoms. If medicines do not help relieve nausea and vomiting, you may need to receive fluids through an IV at the hospital. Follow these instructions at home: Eating and drinking   Avoid the  following: ? Drinking fluids with meals. Try not to drink anything during the 30 minutes before and after your meals. ? Drinking more than 1 cup of fluid at a time. ? Eating foods that trigger your symptoms. These may include spicy foods, coffee, high-fat foods, very sweet foods, and acidic foods. ? Skipping meals. Nausea can be more intense on an empty stomach. If you cannot tolerate food, do not force it. Try sucking on ice chips or other frozen items and make up for missed calories later. ? Lying down within 2 hours after eating. ? Being exposed to environmental triggers. These may include food smells, smoky rooms, closed spaces, rooms with strong smells, warm or humid places, overly loud and noisy rooms, and rooms with motion or flickering lights. Try eating meals in a well-ventilated area that is free of strong smells. ? Quick and sudden changes in your movement. ? Taking iron pills and multivitamins that contain iron. If you take prescription iron pills, do not stop taking them unless your health care provider approves. ? Preparing food. The smell of food can spoil your appetite or trigger nausea.  To help relieve your symptoms: ? Listen to your body. Everyone is different and has different preferences. Find what works best for you. ? Eat and drink slowly. ? Eat 5-6 small meals daily instead of 3 large meals. Eating small meals and snacks can help you avoid an empty stomach. ? In the morning, before getting out of bed, eat a couple of crackers to avoid moving around on an empty stomach. ? Try eating starchy foods as these are usually tolerated  well. Examples include cereal, toast, bread, potatoes, pasta, rice, and pretzels. ? Include at least 1 serving of protein with your meals and snacks. Protein options include lean meats, poultry, seafood, beans, nuts, nut butters, eggs, cheese, and yogurt. ? Try eating a protein-rich snack before bed. Examples of a protein-rick snack include cheese and  crackers or a peanut butter sandwich made with 1 slice of whole-wheat bread and 1 tsp (5 g) of peanut butter. ? Eat or suck on things that have ginger in them. It may help relieve nausea. Add  tsp ground ginger to hot tea or choose ginger tea. ? Try drinking 100% fruit juice or an electrolyte drink. An electrolyte drink contains sodium, potassium, and chloride. ? Drink fluids that are cold, clear, and carbonated or sour. Examples include lemonade, ginger ale, lemon-lime soda, ice water, and sparkling water. ? Brush your teeth or use a mouth rinse after meals. ? Talk with your health care provider about starting a supplement of vitamin B6. General instructions  Take over-the-counter and prescription medicines only as told by your health care provider.  Follow instructions from your health care provider about eating or drinking restrictions.  Continue to take your prenatal vitamins as told by your health care provider. If you are having trouble taking your prenatal vitamins, talk with your health care provider about different options.  Keep all follow-up and pre-birth (prenatal) visits as told by your health care provider. This is important. Contact a health care provider if:  You have pain in your abdomen.  You have a severe headache.  You have vision problems.  You are losing weight.  You feel weak or dizzy. Get help right away if:  You cannot drink fluids without vomiting.  You vomit blood.  You have constant nausea and vomiting.  You are very weak.  You faint.  You have a fever and your symptoms suddenly get worse. Summary  Hyperemesis gravidarum is a severe form of nausea and vomiting that happens during pregnancy.  Making some changes to your eating habits may help relieve nausea and vomiting.  This condition may be managed with medicine.  If medicines do not help relieve nausea and vomiting, you may need to receive fluids through an IV at the hospital. This  information is not intended to replace advice given to you by your health care provider. Make sure you discuss any questions you have with your health care provider. Document Revised: 10/27/2017 Document Reviewed: 06/05/2016 Elsevier Patient Education  2020 [employer].

## 2020-05-16 NOTE — MAU Note (Signed)
Been throwing up and have diarrhea (4-5 loose and watery).  having pain When she throws up.  No one else at home is sick.

## 2020-05-16 NOTE — MAU Provider Note (Signed)
History     [id number]  Arrival date and time: 05/16/20 1329    No chief complaint on file.    HPI CANA MIGNANO is a 24 y.o. 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 Symptoms started today. Unable to tolerate PO today. No new food/exposures. No recent travel. Has not taken any meds for this issue. Patient endorses alternating diarrhea and constipation, although had a BM today. No hematochezia, melena, BRBPR. No fever/chills. No household sick contacts. No other sick symptoms including cp, sob, cough/congestion. No urinary symptoms. Of note, patient had a similar presentation 04/25/20 and symptoms resolved with zofran at that time. Of note, patient also has history of marijuana use.   Review of all prior prenatal and MAU visits.   O/Positive/-- (05/18 1422)  OB History    Gravida  4   Para  3   Term  2   Preterm  1   AB  0   Living  3     SAB  0   TAB  0   Ectopic  0   Multiple  0   Live Births  3           Past Medical History:  Diagnosis Date  . Anemia   . Depression   . Preterm labor     Past Surgical History:  Procedure Laterality Date  . FRACTURE SURGERY     broken arm in childhood, cast only, no surgery    Family History  Problem Relation Age of Onset  . Bipolar disorder Father   . Depression Father   . Anxiety disorder Father   . Arthritis Father   . Diabetes Maternal Grandmother   . Hypertension Maternal Grandfather   . Depression Paternal Grandmother     Social History   Socioeconomic History  . Marital status: Single    Spouse name: Not on file  . Number of children: 3  . Years of education: Not on file  . Highest education level: Not on file  Occupational History  . Occupation: UNEMPLOYED  Tobacco Use  . Smoking status: Former Smoker    Types: Cigarettes    Quit date: 10/22/2019    Years since quitting: 0.5  . Smokeless tobacco: Never  Used  Vaping Use  . Vaping Use: Former  Substance and Sexual Activity  . Alcohol use: No  . Drug use: Not Currently    Types: Marijuana    Comment: Last use 01/30/2020  . Sexual activity: Yes    Birth control/protection: None  Other Topics Concern  . Not on file  Social History Narrative  . Not on file   Social Determinants of Health   Financial Resource Strain:   . Difficulty of Paying Living Expenses:   Food Insecurity:   . Worried About RCharity fundraiserin the Last Year:   . RArboriculturistin the Last Year:   Transportation Needs:   . LFilm/video editor(Medical):   .Marland KitchenLack of Transportation (Non-Medical):   Physical Activity:   . Days of Exercise per Week:   . Minutes of Exercise per Session:   Stress:   . Feeling of Stress :   Social Connections:   . Frequency of Communication with Friends and Family:   . Frequency of Social Gatherings with Friends and Family:   . Attends Religious Services:   . Active Member of Clubs or  Organizations:   . Attends Club or Organization Meetings:   . Marital Status:   Intimate Partner Violence:   . Fear of Current or Ex-Partner:   . Emotionally Abused:   . Physically Abused:   . Sexually Abused:     No Known Allergies  No current facility-administered medications on file prior to encounter.   Current Outpatient Medications on File Prior to Encounter  Medication Sig Dispense Refill  . butalbital-acetaminophen-caffeine (FIORICET) 50-325-40 MG tablet Take 1-2 tablets by mouth every 6 (six) hours as needed for headache. 20 tablet 0  . ferrous sulfate 325 (65 FE) MG EC tablet Take 1 tablet (325 mg total) by mouth daily with breakfast. 45 tablet 3  . ondansetron (ZOFRAN ODT) 4 MG disintegrating tablet Take 1 tablet (4 mg total) by mouth every 6 (six) hours as needed for nausea. 20 tablet 0  . Prenatal Vit-Fe Fum-Fe Bisg-FA (NATACHEW) 28-1 MG CHEW Chew 1 tablet by mouth daily. 30 tablet 12  . sertraline (ZOLOFT) 50 MG tablet  Take 1 tablet (50 mg total) by mouth daily. Take 1/2 tablet qd x 7 days and then 1 whole tablet qd 30 tablet 5  . Blood Pressure Monitoring (BLOOD PRESSURE KIT) DEVI 1 kit by Does not apply route once a week. Check Blood Pressure regularly and record readings into the [employer] App.  Large Cuff.  DX O90.0 1 each 0     ROS Pertinent positives and negative per HPI, all others reviewed and negative  Physical Exam   BP 117/68 (BP Location: Right Arm)   Pulse 86   Temp 98.7 F (37.1 C) (Oral)   Resp 16   Ht 5' 2" (1.575 m)   Wt 54.3 kg   LMP 11/07/2019   SpO2 100%   BMI 21.91 kg/m   Physical Exam Vitals and nursing note reviewed. Exam conducted with a chaperone present.  Constitutional:      General: She is not in acute distress.    Appearance: Normal appearance. She is normal weight.  HENT:     Head: Normocephalic and atraumatic.     Nose: Nose normal.     Mouth/Throat:     Mouth: Mucous membranes are moist.     Pharynx: Oropharynx is clear.  Eyes:     Extraocular Movements: Extraocular movements intact.     Conjunctiva/sclera: Conjunctivae normal.  Cardiovascular:     Rate and Rhythm: Normal rate.     Pulses: Normal pulses.  Pulmonary:     Effort: Pulmonary effort is normal.  Musculoskeletal:        General: Normal range of motion.     Cervical back: Normal range of motion and neck supple.  Skin:    General: Skin is warm and dry.  Neurological:     General: No focal deficit present.     Mental Status: She is alert and oriented to person, place, and time. Mental status is at baseline.  Psychiatric:        Mood and Affect: Mood normal.        Behavior: Behavior normal.     Cervical Exam  not indicated  Bedside Ultrasound Not indicated  My interpretation: n/a  FHR: 140 bpm on doppler FHT: 150bpm, moderate variability, +accels, no decels Toco: quiet  Labs No results found for this or any previous visit (from the past 24 hour(s)).  Imaging No results  found.  MAU Course  Procedures  Lab Orders     Urinalysis, Routine w reflex microscopic No   orders of the defined types were placed in this encounter.  Imaging Orders  No imaging studies ordered today    MDM moderate  Assessment and Plan  24 yo G4P2103 at [redacted]w[redacted]d by 6wk u/s presents for eval of nausea/emesis.  #Nausea/emesis of pregnancy:  patient presents with 1d of nausea/emesis with prior report of similar symptoms in the past. Exam overall unremarkable. Lab work reassuring. Patient symptoms improved with 1L LR bolus and zofran in MAU. Suspicious for viral gastroenteritis given elevated WBC count and presenting symptoms. Symptoms could also be due to hyperemesis gravidarum or marijuana use.  -small frequent meals -encourage PO intake -zofran rx provided  #FWB: Cat 1 NST reactive   Alicia C Firestone  Alicia C Firestone, MD OB Fellow, [facility] [place], [facility] 05/16/2020 2:45 PM   

## 2020-05-21 DIAGNOSIS — Z419 Encounter for procedure for purposes other than remedying health state, unspecified: Secondary | ICD-10-CM | POA: Diagnosis not present

## 2020-05-26 ENCOUNTER — Encounter: Payer: Medicaid Other | Admitting: Obstetrics

## 2020-05-26 ENCOUNTER — Ambulatory Visit: Payer: Medicaid Other

## 2020-05-26 ENCOUNTER — Ambulatory Visit: Payer: Medicaid Other | Attending: Obstetrics and Gynecology

## 2020-05-26 ENCOUNTER — Other Ambulatory Visit: Payer: Medicaid Other

## 2020-06-05 ENCOUNTER — Other Ambulatory Visit: Payer: Medicaid Other

## 2020-06-05 ENCOUNTER — Ambulatory Visit: Payer: Medicaid Other

## 2020-06-05 ENCOUNTER — Ambulatory Visit: Payer: Medicaid Other | Attending: Obstetrics and Gynecology

## 2020-06-05 ENCOUNTER — Encounter: Payer: Medicaid Other | Admitting: Obstetrics

## 2020-06-15 ENCOUNTER — Encounter: Payer: Self-pay | Admitting: *Deleted

## 2020-06-15 ENCOUNTER — Ambulatory Visit: Payer: Medicaid Other | Attending: Obstetrics and Gynecology | Admitting: *Deleted

## 2020-06-15 ENCOUNTER — Other Ambulatory Visit: Payer: Self-pay

## 2020-06-15 ENCOUNTER — Ambulatory Visit (HOSPITAL_BASED_OUTPATIENT_CLINIC_OR_DEPARTMENT_OTHER): Payer: Medicaid Other

## 2020-06-15 DIAGNOSIS — Z3A31 31 weeks gestation of pregnancy: Secondary | ICD-10-CM

## 2020-06-15 DIAGNOSIS — O361930 Maternal care for other isoimmunization, third trimester, not applicable or unspecified: Secondary | ICD-10-CM

## 2020-06-15 DIAGNOSIS — O2693 Pregnancy related conditions, unspecified, third trimester: Secondary | ICD-10-CM

## 2020-06-15 DIAGNOSIS — Z3A33 33 weeks gestation of pregnancy: Secondary | ICD-10-CM | POA: Diagnosis not present

## 2020-06-15 DIAGNOSIS — O09213 Supervision of pregnancy with history of pre-term labor, third trimester: Secondary | ICD-10-CM

## 2020-06-15 DIAGNOSIS — O09293 Supervision of pregnancy with other poor reproductive or obstetric history, third trimester: Secondary | ICD-10-CM | POA: Diagnosis not present

## 2020-06-15 DIAGNOSIS — Z362 Encounter for other antenatal screening follow-up: Secondary | ICD-10-CM | POA: Diagnosis not present

## 2020-06-15 DIAGNOSIS — O0990 Supervision of high risk pregnancy, unspecified, unspecified trimester: Secondary | ICD-10-CM

## 2020-06-15 NOTE — Progress Notes (Signed)
Patient states she had some liquid discharge with reddish streaks today. Very vague description.

## 2020-06-18 ENCOUNTER — Inpatient Hospital Stay (HOSPITAL_COMMUNITY)
Admission: AD | Admit: 2020-06-18 | Discharge: 2020-06-18 | Disposition: A | Discharge status: Home / Self Care | Payer: Medicaid Other | Attending: Obstetrics & Gynecology | Admitting: Obstetrics & Gynecology

## 2020-06-18 ENCOUNTER — Encounter (HOSPITAL_COMMUNITY): Payer: Self-pay | Admitting: Obstetrics & Gynecology

## 2020-06-18 ENCOUNTER — Other Ambulatory Visit: Payer: Self-pay

## 2020-06-18 DIAGNOSIS — Z3A32 32 weeks gestation of pregnancy: Secondary | ICD-10-CM

## 2020-06-18 DIAGNOSIS — F1290 Cannabis use, unspecified, uncomplicated: Secondary | ICD-10-CM | POA: Insufficient documentation

## 2020-06-18 DIAGNOSIS — Z87891 Personal history of nicotine dependence: Secondary | ICD-10-CM | POA: Diagnosis not present

## 2020-06-18 DIAGNOSIS — O4703 False labor before 37 completed weeks of gestation, third trimester: Secondary | ICD-10-CM | POA: Diagnosis not present

## 2020-06-18 DIAGNOSIS — R112 Nausea with vomiting, unspecified: Secondary | ICD-10-CM | POA: Diagnosis not present

## 2020-06-18 DIAGNOSIS — O99323 Drug use complicating pregnancy, third trimester: Secondary | ICD-10-CM | POA: Diagnosis not present

## 2020-06-18 DIAGNOSIS — Z20822 Contact with and (suspected) exposure to covid-19: Secondary | ICD-10-CM | POA: Insufficient documentation

## 2020-06-18 DIAGNOSIS — O99891 Other specified diseases and conditions complicating pregnancy: Secondary | ICD-10-CM

## 2020-06-18 DIAGNOSIS — R05 Cough: Secondary | ICD-10-CM

## 2020-06-18 LAB — CBC
HCT: 32 % — ABNORMAL LOW (ref 36.0–46.0)
Hemoglobin: 10.4 g/dL — ABNORMAL LOW (ref 12.0–15.0)
MCH: 31.1 pg (ref 26.0–34.0)
MCHC: 32.5 g/dL (ref 30.0–36.0)
MCV: 95.8 fL (ref 80.0–100.0)
Platelets: 393 10*3/uL (ref 150–400)
RBC: 3.34 MIL/uL — ABNORMAL LOW (ref 3.87–5.11)
RDW: 12.6 % (ref 11.5–15.5)
WBC: 14.9 10*3/uL — ABNORMAL HIGH (ref 4.0–10.5)
nRBC: 0 % (ref 0.0–0.2)

## 2020-06-18 LAB — COMPREHENSIVE METABOLIC PANEL
ALT: 11 U/L (ref 0–44)
AST: 16 U/L (ref 15–41)
Albumin: 2.7 g/dL — ABNORMAL LOW (ref 3.5–5.0)
Alkaline Phosphatase: 123 U/L (ref 38–126)
Anion gap: 13 (ref 5–15)
BUN: <5 mg/dL — ABNORMAL LOW (ref 6–20)
CO2: 20 mmol/L — ABNORMAL LOW (ref 22–32)
Calcium: 8.9 mg/dL (ref 8.9–10.3)
Chloride: 103 mmol/L (ref 98–111)
Creatinine, Ser: 0.52 mg/dL (ref 0.44–1.00)
GFR calc Af Amer: >60 mL/min (ref >60)
GFR calc non Af Amer: >60 mL/min (ref >60)
Glucose, Bld: 81 mg/dL (ref 70–99)
Potassium: 3.5 mmol/L (ref 3.5–5.1)
Sodium: 136 mmol/L (ref 135–145)
Total Bilirubin: 0.7 mg/dL (ref 0.3–1.2)
Total Protein: 6.5 g/dL (ref 6.5–8.1)

## 2020-06-18 LAB — URINALYSIS, ROUTINE W REFLEX MICROSCOPIC
Glucose, UA: NEGATIVE mg/dL
Hgb urine dipstick: NEGATIVE
Ketones, ur: >=80 mg/dL — AB
Nitrite: NEGATIVE
Protein, ur: NEGATIVE mg/dL
Specific Gravity, Urine: >1.03 — ABNORMAL HIGH (ref 1.005–1.030)
pH: 6 (ref 5.0–8.0)

## 2020-06-18 LAB — URINALYSIS, MICROSCOPIC (REFLEX)

## 2020-06-18 LAB — RAPID URINE DRUG SCREEN, HOSP PERFORMED
Amphetamines: NOT DETECTED
Barbiturates: NOT DETECTED
Benzodiazepines: NOT DETECTED
Cocaine: NOT DETECTED
Opiates: NOT DETECTED
Tetrahydrocannabinol: POSITIVE — AB

## 2020-06-18 LAB — SARS CORONAVIRUS 2 BY RT PCR (HOSPITAL ORDER, PERFORMED IN ~~LOC~~ HOSPITAL LAB): SARS Coronavirus 2: NEGATIVE

## 2020-06-18 LAB — FETAL FIBRONECTIN: Fetal Fibronectin: POSITIVE — AB

## 2020-06-18 MED ORDER — NIFEDIPINE 10 MG PO CAPS
10.0000 mg | ORAL_CAPSULE | ORAL | Status: AC | PRN
  Administered 2020-06-18 (×3): 10 mg via ORAL
  Filled 2020-06-18 (×3): qty 1

## 2020-06-18 MED ORDER — NIFEDIPINE 10 MG PO CAPS: 10.0000 mg | ORAL_CAPSULE | Freq: Three times a day (TID) | ORAL | 1 refills | Status: DC

## 2020-06-18 MED ORDER — BETAMETHASONE SOD PHOS & ACET 6 (3-3) MG/ML IJ SUSP
12.0000 mg | INTRAMUSCULAR | Status: DC
  Administered 2020-06-18: 12 mg via INTRAMUSCULAR
  Filled 2020-06-18: qty 5

## 2020-06-18 MED ORDER — ONDANSETRON 4 MG PO TBDP
8.0000 mg | ORAL_TABLET | Freq: Once | ORAL | Status: AC
  Administered 2020-06-18: 8 mg via ORAL
  Filled 2020-06-18: qty 2

## 2020-06-18 MED ORDER — LACTATED RINGERS IV BOLUS
1000.0000 mL | Freq: Once | INTRAVENOUS | Status: AC
  Administered 2020-06-18: 1000 mL via INTRAVENOUS

## 2020-06-18 MED ORDER — BUTALBITAL-APAP-CAFFEINE 50-325-40 MG PO TABS
2.0000 | ORAL_TABLET | ORAL | Status: DC | PRN
  Administered 2020-06-18: 2 via ORAL
  Filled 2020-06-18: qty 2

## 2020-06-18 NOTE — MAU Provider Note (Addendum)
[name] is a 24 y.o. [phone]  at 59w0dhere with complaints of cough, nausea, vomiting and feeling unwell. She reports HA. She started to feel unwell at a party yesterday; woke up this morning with vomiting. She has not been vaccinated against COVID-19.   She denies contractions, LOF, decreased fetal movements, fever, SOB, VB.   Her kids were sick for the past week: runny nose, coughing. She thinks she caught something from them. She has been around a lot of sick kids lately. Her main complaint is nausea and HA.   History     CSN: [id number] Arrival date and time: 06/18/20 1205   None     Chief Complaint  Patient presents with  . Headache  . Emesis   Headache  This is a new problem. The current episode started today. The problem occurs intermittently. The pain is located in the frontal region. The pain does not radiate. The pain is at a severity of 7/10. Associated symptoms include vomiting. Pertinent negatives include no blurred vision, phonophobia or photophobia.   She has a history of migraines; but this does not feel like a migraine. She also reports that she has been throwing up twice today but it has not continued.  OB History    Gravida  4   Para  3   Term  2   Preterm  1   AB  0   Living  3     SAB  0   TAB  0   Ectopic  0   Multiple  0   Live Births  3           Past Medical History:  Diagnosis Date  . Anemia   . Depression   . Preterm labor     Past Surgical History:  Procedure Laterality Date  . FRACTURE SURGERY     broken arm in childhood, cast only, no surgery    Family History  Problem Relation Age of Onset  . Bipolar disorder Father   . Depression Father   . Anxiety disorder Father   . Arthritis Father   . Diabetes Maternal Grandmother   . Hypertension Maternal Grandfather   . Depression Paternal Grandmother     Social History   Tobacco Use  . Smoking status: Former Smoker    Types: Cigarettes     Quit date: 10/22/2019    Years since quitting: 0.6  . Smokeless tobacco: Never Used  Vaping Use  . Vaping Use: Former  Substance Use Topics  . Alcohol use: No  . Drug use: Not Currently    Types: Marijuana    Comment: Last use 01/30/2020    Allergies: No Known Allergies  Medications Prior to Admission  Medication Sig Dispense Refill Last Dose  . Blood Pressure Monitoring (BLOOD PRESSURE KIT) DEVI 1 kit by Does not apply route once a week. Check Blood Pressure regularly and record readings into the [employer] App.  Large Cuff.  DX O90.0 1 each 0   . butalbital-acetaminophen-caffeine (FIORICET) 50-325-40 MG tablet Take 1-2 tablets by mouth every 6 (six) hours as needed for headache. 20 tablet 0   . ferrous sulfate 325 (65 FE) MG EC tablet Take 1 tablet (325 mg total) by mouth daily with breakfast. 45 tablet 3   . ondansetron (ZOFRAN ODT) 4 MG disintegrating tablet Take 1 tablet (4 mg total) by mouth every 6 (six) hours as needed for nausea. 20 tablet 1   . Prenatal Vit-Fe Fum-Fe  Bisg-FA (NATACHEW) 28-1 MG CHEW Chew 1 tablet by mouth daily. 30 tablet 12   . sertraline (ZOLOFT) 50 MG tablet Take 1 tablet (50 mg total) by mouth daily. Take 1/2 tablet qd x 7 days and then 1 whole tablet qd 30 tablet 5     Review of Systems  Constitutional: Negative.   HENT: Negative.   Eyes: Negative for blurred vision and photophobia.  Respiratory: Negative.   Cardiovascular: Negative.   Gastrointestinal: Positive for vomiting.  Genitourinary: Negative.   Musculoskeletal: Negative.   Neurological: Positive for headaches.  Psychiatric/Behavioral: Negative.    Physical Exam   Blood pressure 109/69, pulse 80, temperature 98.2 F (36.8 C), temperature source Oral, resp. rate 16, last menstrual period 11/07/2019, SpO2 98 %, unknown if currently breastfeeding.  Physical Exam Abdominal:     Palpations: Abdomen is soft.  Musculoskeletal:        General: Normal range of motion.  Skin:    General: Skin  is warm.  Neurological:     Mental Status: She is alert.  Psychiatric:        Mood and Affect: Mood normal.     MAU Course  Procedures  MDM -will give fioricet for HA -will draw COVID test; zofran and IV fluids given  -UDS show positive THC; she smokes "every now and again" -CBC and CMP are normal 1424: patient will try crackers and juice as she feels better; lungs are CTA and no distress when breathing.  -reporting contractions, given 2 doses of procardia.  -COVID test negative -FFN is positive 1700: patient now s/p 3 doses of procardia; cervix is long, posterior 1 cm. Will recheck in one more hour, continue IV fluids and give BMZ.  Although FFN is positive, cervix is unchanged at 3rd exam. Patient's pain is a 0/10 and she desires discharge.  Patient's presentation and exam reviewed with Dr. Elonda Husky, who agrees patient stable for discharge with close follow-up.  Assessment and Plan   1. Preterm labor in third trimester without delivery    2. Patient agrees to return to MAU tomorrow night for 2nd dose of BMZ.   3. Patient given RX for procardia to take as needed, reviewed strict warning signs and when to return to MAU. Reviewed importance of decreasing THC use in pregnancy.   4. Patient will go get COVID vaccine tomorrow; patient understands importance of preventing COVID.   5. Patient to keep appt on this week for follow up.   Mervyn Skeeters Andie Mungin 06/18/2020, 1:52 PM

## 2020-06-18 NOTE — Discharge Instructions (Signed)
-take procardia whenever you feel contractions, no more than 3 times a day -return to MAU if contractions get worse -return to Maternity Admission after 5 pm for 2nd dose of steroids for baby's lungs  Preventing Preterm Birth Preterm birth is when your baby is delivered between 20 weeks and 37 weeks of pregnancy. A full-term pregnancy lasts for at least 37 weeks. Preterm birth can be dangerous for your baby because the last few weeks of pregnancy are an important time for your baby's brain and lungs to grow. Many things can cause a baby to be born early. Sometimes the cause is not known. There are certain factors that make you more likely to experience preterm birth, such as:  Having a previous baby born preterm.  Being pregnant with twins or other multiples.  Having had fertility treatment.  Being overweight or underweight at the start of your pregnancy.  Having any of the following during pregnancy: ? An infection, including a urinary tract infection (UTI) or an STI (sexually transmitted infection). ? High blood pressure. ? Diabetes. ? Vaginal bleeding.  Being age 20 or older.  Being age 21 or younger.  Getting pregnant within 6 months of a previous pregnancy.  Suffering extreme stress or physical or emotional abuse during pregnancy.  Standing for long periods of time during pregnancy, such as working at a job that requires standing. What are the risks? The most serious risk of preterm birth is that the baby may not survive. This is more likely to happen if a baby is born before 34 weeks. Other risks and complications of preterm birth may include your baby having:  Breathing problems.  Brain damage that affects movement and coordination (cerebral palsy).  Feeding difficulties.  Vision or hearing problems.  Infections or inflammation of the digestive tract (colitis).  Developmental delays.  Learning disabilities.  Higher risk for diabetes, heart disease, and high blood  pressure later in life. What can I do to lower my risk?  Medical care The most important thing you can do to lower your risk for preterm birth is to get routine medical care during pregnancy (prenatal care). If you have a high risk of preterm birth, you may be referred to a health care provider who specializes in managing high-risk pregnancies (perinatologist). You may be given medicine to help prevent preterm birth. Lifestyle changes Certain lifestyle changes can also lower your risk of preterm birth:  Wait at least 6 months after a pregnancy to become pregnant again.  Try to plan pregnancy for when you are between 61 and 63 years old.  Get to a healthy weight before getting pregnant. If you are overweight, work with your health care provider to safely lose weight.  Do not use any products that contain nicotine or tobacco, such as cigarettes and e-cigarettes. If you need help quitting, ask your health care provider.  Do not drink alcohol.  Do not use drugs. Where to find support For more support, consider:  Talking with your health care provider.  Talking with a therapist or substance abuse counselor, if you need help quitting.  Working with a diet and nutrition specialist (dietitian) or a Systems analyst to maintain a healthy weight.  Joining a support group. Where to find more information Learn more about preventing preterm birth from:  Centers for Disease Control and Prevention: http://curry.org/  March of Dimes: marchofdimes.org/complications/premature-babies.aspx  American Pregnancy Association: americanpregnancy.org/labor-and-birth/premature-labor Contact a health care provider if:  You have any of the following signs of preterm labor  before 37 weeks: ? A change or increase in vaginal discharge. ? Fluid leaking from your vagina. ? Pressure or cramps in your lower abdomen. ? A backache that does not go away or gets  worse. ? Regular tightening (contractions) in your lower abdomen. Summary  Preterm birth means having your baby during weeks 20-37 of pregnancy.  Preterm birth may put your baby at risk for physical and mental problems.  Getting good prenatal care can help prevent preterm birth.  You can lower your risk of preterm birth by making certain lifestyle changes, such as not smoking and not using alcohol. This information is not intended to replace advice given to you by your health care provider. Make sure you discuss any questions you have with your health care provider. Document Revised: 09/19/2017 Document Reviewed: 06/15/2016 Elsevier Patient Education  2020 [employer].

## 2020-06-18 NOTE — MAU Note (Signed)
Sandra Matthews is a 24 y.o. at [redacted]w[redacted]d here in MAU reporting:  +emesis (patient vomiting during triage)  +headache  + sore throat Requesting cough drops  Onset of complaint: started feeling bad yesterday at her uncle's party. Then woke up this morning feeling worse.  Pain score: 6-7/10 Vitals:   06/18/20 1240  BP: 109/69  Pulse: 80  Resp: 16  Temp: 98.2 F (36.8 C)  SpO2: 98%     FHT: +FM Lab orders placed from triage: ua

## 2020-06-19 ENCOUNTER — Other Ambulatory Visit: Payer: Self-pay

## 2020-06-19 ENCOUNTER — Inpatient Hospital Stay (HOSPITAL_COMMUNITY)
Admission: AD | Admit: 2020-06-19 | Discharge: 2020-06-19 | Disposition: A | Discharge status: Home / Self Care | Payer: Medicaid Other | Attending: Obstetrics and Gynecology | Admitting: Obstetrics and Gynecology

## 2020-06-19 DIAGNOSIS — Z3A32 32 weeks gestation of pregnancy: Secondary | ICD-10-CM | POA: Diagnosis not present

## 2020-06-19 DIAGNOSIS — O4700 False labor before 37 completed weeks of gestation, unspecified trimester: Secondary | ICD-10-CM

## 2020-06-19 DIAGNOSIS — O4703 False labor before 37 completed weeks of gestation, third trimester: Secondary | ICD-10-CM | POA: Diagnosis not present

## 2020-06-19 MED ORDER — BETAMETHASONE SOD PHOS & ACET 6 (3-3) MG/ML IJ SUSP
12.0000 mg | Freq: Once | INTRAMUSCULAR | Status: AC
  Administered 2020-06-19: 12 mg via INTRAMUSCULAR

## 2020-06-19 MED ORDER — ACETAMINOPHEN 325 MG PO TABS
650.0000 mg | ORAL_TABLET | Freq: Once | ORAL | Status: AC
  Administered 2020-06-19: 650 mg via ORAL
  Filled 2020-06-19: qty 2

## 2020-06-19 NOTE — MAU Note (Signed)
Pt here for second dose of betmethasone. Reports some mild cramping.  Took procardia and it has come down.  Has  Headache not taken tylenol. Good fetal movement felt.

## 2020-06-19 NOTE — MAU Provider Note (Signed)
First Provider Initiated Contact with Patient 06/19/20 1714      S Ms. Sandra Matthews is a 24 y.o. [phone] pregnant female who presents to MAU today for Betamethasone injection 2 of 2. She denies abdominal pain, vaginal bleeding, leaking of fluid, decreased fetal movement, fever, falls, or recent illness.  She reports sinus congestion and mild headache and requests Tylenol for these complaints.   O BP (!) 109/55   Pulse 91   Temp 98.8 F (37.1 C)   Resp 18   LMP 11/07/2019    Physical Exam Vitals and nursing note reviewed.  Cardiovascular:     Rate and Rhythm: Normal rate.     Pulses: Normal pulses.  Pulmonary:     Effort: Pulmonary effort is normal.  Abdominal:     Comments: Gravid  Skin:    General: Skin is warm and dry.     Capillary Refill: Capillary refill takes less than 2 seconds.  Psychiatric:        Mood and Affect: Mood normal.    A [id number] at [redacted]w[redacted]d  Medical screening exam complete BMZ #2 given   P Discharge from MAU in stable condition Patient may return to MAU as needed for pregnancy related complaints  F/U Next appt with [facility] is 08/31  Clayton Bibles, CNM 06/19/2020 5:40 PM

## 2020-06-19 NOTE — Discharge Instructions (Signed)

## 2020-06-20 ENCOUNTER — Other Ambulatory Visit: Payer: Medicaid Other

## 2020-06-20 ENCOUNTER — Encounter: Payer: Medicaid Other | Admitting: Obstetrics

## 2020-06-21 DIAGNOSIS — Z419 Encounter for procedure for purposes other than remedying health state, unspecified: Secondary | ICD-10-CM | POA: Diagnosis not present

## 2020-06-23 ENCOUNTER — Other Ambulatory Visit: Payer: Self-pay

## 2020-06-23 ENCOUNTER — Encounter (HOSPITAL_COMMUNITY): Payer: Self-pay | Admitting: Obstetrics and Gynecology

## 2020-06-23 ENCOUNTER — Inpatient Hospital Stay (HOSPITAL_BASED_OUTPATIENT_CLINIC_OR_DEPARTMENT_OTHER): Payer: Medicaid Other

## 2020-06-23 ENCOUNTER — Inpatient Hospital Stay (HOSPITAL_COMMUNITY)
Admission: AD | Admit: 2020-06-23 | Discharge: 2020-06-23 | Disposition: A | Discharge status: Home / Self Care | Payer: Medicaid Other | Attending: Obstetrics and Gynecology | Admitting: Obstetrics and Gynecology

## 2020-06-23 DIAGNOSIS — R109 Unspecified abdominal pain: Secondary | ICD-10-CM | POA: Insufficient documentation

## 2020-06-23 DIAGNOSIS — O09213 Supervision of pregnancy with history of pre-term labor, third trimester: Secondary | ICD-10-CM | POA: Diagnosis not present

## 2020-06-23 DIAGNOSIS — O2693 Pregnancy related conditions, unspecified, third trimester: Secondary | ICD-10-CM

## 2020-06-23 DIAGNOSIS — O4703 False labor before 37 completed weeks of gestation, third trimester: Secondary | ICD-10-CM | POA: Diagnosis not present

## 2020-06-23 DIAGNOSIS — O09293 Supervision of pregnancy with other poor reproductive or obstetric history, third trimester: Secondary | ICD-10-CM | POA: Insufficient documentation

## 2020-06-23 DIAGNOSIS — O99343 Other mental disorders complicating pregnancy, third trimester: Secondary | ICD-10-CM | POA: Insufficient documentation

## 2020-06-23 DIAGNOSIS — Z79899 Other long term (current) drug therapy: Secondary | ICD-10-CM | POA: Diagnosis not present

## 2020-06-23 DIAGNOSIS — F329 Major depressive disorder, single episode, unspecified: Secondary | ICD-10-CM | POA: Diagnosis not present

## 2020-06-23 DIAGNOSIS — Z87891 Personal history of nicotine dependence: Secondary | ICD-10-CM | POA: Diagnosis not present

## 2020-06-23 DIAGNOSIS — Z3A32 32 weeks gestation of pregnancy: Secondary | ICD-10-CM

## 2020-06-23 DIAGNOSIS — O26853 Spotting complicating pregnancy, third trimester: Secondary | ICD-10-CM

## 2020-06-23 DIAGNOSIS — O4693 Antepartum hemorrhage, unspecified, third trimester: Secondary | ICD-10-CM

## 2020-06-23 DIAGNOSIS — O26893 Other specified pregnancy related conditions, third trimester: Secondary | ICD-10-CM | POA: Insufficient documentation

## 2020-06-23 DIAGNOSIS — N920 Excessive and frequent menstruation with regular cycle: Secondary | ICD-10-CM

## 2020-06-23 LAB — CBC
HCT: 26.2 % — ABNORMAL LOW (ref 36.0–46.0)
Hemoglobin: 8.7 g/dL — ABNORMAL LOW (ref 12.0–15.0)
MCH: 30.4 pg (ref 26.0–34.0)
MCHC: 33.2 g/dL (ref 30.0–36.0)
MCV: 91.6 fL (ref 80.0–100.0)
Platelets: 391 10*3/uL (ref 150–400)
RBC: 2.86 MIL/uL — ABNORMAL LOW (ref 3.87–5.11)
RDW: 12.4 % (ref 11.5–15.5)
WBC: 13.2 10*3/uL — ABNORMAL HIGH (ref 4.0–10.5)
nRBC: 0 % (ref 0.0–0.2)

## 2020-06-23 LAB — URINALYSIS, ROUTINE W REFLEX MICROSCOPIC
Bacteria, UA: NONE SEEN
Bilirubin Urine: NEGATIVE
Glucose, UA: 50 mg/dL — AB
Hgb urine dipstick: NEGATIVE
Ketones, ur: NEGATIVE mg/dL
Leukocytes,Ua: NEGATIVE
Nitrite: NEGATIVE
Protein, ur: 30 mg/dL — AB
Specific Gravity, Urine: 1.027 (ref 1.005–1.030)
pH: 6 (ref 5.0–8.0)

## 2020-06-23 MED ORDER — CYCLOBENZAPRINE HCL 5 MG PO TABS
10.0000 mg | ORAL_TABLET | Freq: Once | ORAL | Status: AC
  Administered 2020-06-23: 10 mg via ORAL
  Filled 2020-06-23: qty 2

## 2020-06-23 MED ORDER — LACTATED RINGERS IV BOLUS
1000.0000 mL | Freq: Once | INTRAVENOUS | Status: AC
  Administered 2020-06-23: 1000 mL via INTRAVENOUS

## 2020-06-23 MED ORDER — ACETAMINOPHEN 500 MG PO TABS
1000.0000 mg | ORAL_TABLET | Freq: Once | ORAL | Status: AC
  Administered 2020-06-23: 1000 mg via ORAL
  Filled 2020-06-23: qty 2

## 2020-06-23 NOTE — MAU Note (Signed)
Pt here with reports of vaginal spotting that started this morning. Pt reports she still feels contractions but hasn't been timing them. Contractions started yesterday but worse today. Pt reports she took medicine for contractions around 5-6pm this evening, but not helping. This was her third dose today. Pt reports good fetal movement.

## 2020-06-23 NOTE — MAU Provider Note (Addendum)
History     CSN: [id number]  Arrival date and time: 06/23/20 2006   First Provider Initiated Contact with Patient 06/23/20 2047      Chief Complaint  Patient presents with  . Vaginal Bleeding   HPI Sandra Matthews is a 24 y.o. [phone] at 20w5dwho presents to MAU with chief complaint of vaginal spotting. This is a recurrent problem which patient states she has experienced for the past several weeks. She reports seeing smears of pink when she wipes. She denies heavy or over vaginal bleeding. She has not donned a pad.  Patient also complains of suprapubic pressure, recurrent in third trimester. She has been prescribed Procardia for preterm contractions and endorses taking it three times today. She rates her abdominal pain as 5-6/10. She denies aggravating or alleviating factors.  [place]  OB History    Gravida  4   Para  3   Term  2   Preterm  1   AB  0   Living  3     SAB  0   TAB  0   Ectopic  0   Multiple  0   Live Births  3           Past Medical History:  Diagnosis Date  . Anemia   . Depression   . Preterm labor     Past Surgical History:  Procedure Laterality Date  . FRACTURE SURGERY     broken arm in childhood, cast only, no surgery    Family History  Problem Relation Age of Onset  . Bipolar disorder Father   . Depression Father   . Anxiety disorder Father   . Arthritis Father   . Diabetes Maternal Grandmother   . Hypertension Maternal Grandfather   . Depression Paternal Grandmother     Social History   Tobacco Use  . Smoking status: Former Smoker    Types: Cigarettes    Quit date: 10/22/2019    Years since quitting: 0.6  . Smokeless tobacco: Never Used  Vaping Use  . Vaping Use: Former  Substance Use Topics  . Alcohol use: No  . Drug use: Not Currently    Types: Marijuana    Comment: last use 06/21/2020    Allergies: No Known Allergies  Medications  Prior to Admission  Medication Sig Dispense Refill Last Dose  . butalbital-acetaminophen-caffeine (FIORICET) 50-325-40 MG tablet Take 1-2 tablets by mouth every 6 (six) hours as needed for headache. 20 tablet 0 06/23/2020 at Unknown time  . NIFEdipine (PROCARDIA) 10 MG capsule Take 1 capsule (10 mg total) by mouth 3 (three) times daily. 60 capsule 1 06/23/2020 at Unknown time  . ondansetron (ZOFRAN ODT) 4 MG disintegrating tablet Take 1 tablet (4 mg total) by mouth every 6 (six) hours as needed for nausea. 20 tablet 1 06/23/2020 at Unknown time  . Prenatal Vit-Fe Fum-Fe Bisg-FA (NATACHEW) 28-1 MG CHEW Chew 1 tablet by mouth daily. 30 tablet 12 06/23/2020 at Unknown time  . sertraline (ZOLOFT) 50 MG tablet Take 1 tablet (50 mg total) by mouth daily. Take 1/2 tablet qd x 7 days and then 1 whole tablet qd 30 tablet 5 06/23/2020 at Unknown time  . Blood Pressure Monitoring (BLOOD PRESSURE KIT) DEVI 1 kit by Does not apply route once a week. Check Blood Pressure regularly and record readings into the [employer] App.  Large Cuff.  DX O90.0 1 each 0   .  ferrous sulfate 325 (65 FE) MG EC tablet Take 1 tablet (325 mg total) by mouth daily with breakfast. 45 tablet 3 More than a month at Unknown time    Review of Systems  Gastrointestinal: Positive for abdominal pain.  Genitourinary: Positive for pelvic pain and vaginal bleeding.  Musculoskeletal: Negative for back pain.  All other systems reviewed and are negative.  Physical Exam   Blood pressure 126/77, pulse 77, temperature 99 F (37.2 C), temperature source Oral, resp. rate 16, height _0  (1.575 m), weight 56.4 kg, last menstrual period 11/07/2019, SpO2 100 %, unknown if currently breastfeeding.  Physical Exam Vitals and nursing note reviewed. Exam conducted with a chaperone present.  Cardiovascular:     Rate and Rhythm: Normal rate.     Pulses: Normal pulses.     Heart sounds: Normal heart sounds.  Pulmonary:     Effort: Pulmonary effort is normal.      Breath sounds: Normal breath sounds.  Abdominal:     Comments: Gravid  Genitourinary:    Comments: Pelvic exam: External genitalia normal, vaginal walls pink and well rugated, cervix visually closed, no lesions noted. Bimanual: cervix 1/long/posterior unchanged from previous  Skin:    General: Skin is warm.     Capillary Refill: Capillary refill takes less than 2 seconds.  Neurological:     General: No focal deficit present.     Mental Status: She is alert.  Psychiatric:        Mood and Affect: Mood normal.     MAU Course  Procedures: speculum exam, MFM  OB Limited  --Reactive tracing: baseline 135, mod var, + accels, no decels --Toco: UI, occasional contraction --S/p BMZ 08/29 and 08/30 --Compliant with weekly 17-P for history of delivery at 33 weeks 6 days followed by Term VD x 2 (also on 17-P) --Cervix 1/long and posterior/-3, unchanged from 06/18/2020  Orders Placed This Encounter  Procedures  . [place] MFM OB LIMITED  . Urinalysis, Routine w reflex microscopic Urine, Clean Catch  . CBC   Meds ordered this encounter  Medications  . lactated ringers bolus 1,000 mL  . acetaminophen (TYLENOL) tablet 1,000 mg  . cyclobenzaprine (FLEXERIL) tablet 10 mg    Results for orders placed or performed during the hospital encounter of 06/23/20 (from the past 24 hour(s))  Urinalysis, Routine w reflex microscopic Urine, Clean Catch     Status: Abnormal   Collection Time: 06/23/20  8:48 PM  Result Value Ref Range   Color, Urine YELLOW YELLOW   APPearance CLEAR CLEAR   Specific Gravity, Urine 1.027 1.005 - 1.030   pH 6.0 5.0 - 8.0   Glucose, UA 50 (A) NEGATIVE mg/dL   Hgb urine dipstick NEGATIVE NEGATIVE   Bilirubin Urine NEGATIVE NEGATIVE   Ketones, ur NEGATIVE NEGATIVE mg/dL   Protein, ur 30 (A) NEGATIVE mg/dL   Nitrite NEGATIVE NEGATIVE   Leukocytes,Ua NEGATIVE NEGATIVE   RBC / HPF 0-5 0 - 5 RBC/hpf   WBC, UA 0-5 0 - 5 WBC/hpf   Bacteria, UA NONE SEEN NONE SEEN    Squamous Epithelial / LPF 6-10 0 - 5   Mucus PRESENT   CBC     Status: Abnormal   Collection Time: 06/23/20  9:08 PM  Result Value Ref Range   WBC 13.2 (H) 4.0 - 10.5 K/uL   RBC 2.86 (L) 3.87 - 5.11 MIL/uL   Hemoglobin 8.7 (L) 12.0 - 15.0 g/dL   HCT 26.2 (L) 36 - 46 %  MCV 91.6 80.0 - 100.0 fL   MCH 30.4 26.0 - 34.0 pg   MCHC 33.2 30.0 - 36.0 g/dL   RDW 12.4 11.5 - 15.5 %   Platelets 391 150 - 400 K/uL   nRBC 0.0 0.0 - 0.2 %    Report given to H. Norman Herrlich, DNP, CNM who assumes care of patient at this time  Mallie Snooks, MSN, CNM Certified Nurse Midwife, [facility] for [employer], [employer] 06/23/20 9:48 PM   MFM [place]: no previa or abruption. Normal AFI. Patient is sleeping comfortably at this time. Will DC home with outpatient follow up.   Assessment and Plan   1. Threatened premature labor in third trimester   2. Spotting   3. Spotting affecting pregnancy in third trimester   4. [redacted] weeks gestation of pregnancy    +FFN on 8/29 BMZ on 8/29 and 8/30 No cervical change from exam on 8/29 to today  PTL precautions  Fetal kick counts RX: no new  Return to MAU as needed or is symptoms worsen or fail to improve  FU with OB as planned   Follow-up Information    [facility].   Contact information: [address] [place] [address] [phone]

## 2020-06-23 NOTE — Discharge Instructions (Signed)

## 2020-06-29 ENCOUNTER — Ambulatory Visit (INDEPENDENT_AMBULATORY_CARE_PROVIDER_SITE_OTHER): Payer: Medicaid Other | Admitting: Obstetrics and Gynecology

## 2020-06-29 ENCOUNTER — Encounter: Payer: Self-pay | Admitting: Obstetrics and Gynecology

## 2020-06-29 ENCOUNTER — Other Ambulatory Visit: Payer: Self-pay

## 2020-06-29 VITALS — BP 121/78 | HR 79 | Wt 128.4 lb

## 2020-06-29 DIAGNOSIS — F32A Depression, unspecified: Secondary | ICD-10-CM

## 2020-06-29 DIAGNOSIS — F329 Major depressive disorder, single episode, unspecified: Secondary | ICD-10-CM

## 2020-06-29 DIAGNOSIS — O99340 Other mental disorders complicating pregnancy, unspecified trimester: Secondary | ICD-10-CM

## 2020-06-29 DIAGNOSIS — Z8759 Personal history of other complications of pregnancy, childbirth and the puerperium: Secondary | ICD-10-CM

## 2020-06-29 DIAGNOSIS — O0990 Supervision of high risk pregnancy, unspecified, unspecified trimester: Secondary | ICD-10-CM

## 2020-06-29 MED ORDER — PREPARATION H 0.25-14-74.9 % RE OINT: 1.0000 "application " | TOPICAL_OINTMENT | Freq: Two times a day (BID) | RECTAL | 1 refills | Status: DC | PRN

## 2020-06-29 NOTE — Progress Notes (Signed)
   PRENATAL VISIT NOTE  Subjective:  Sandra Matthews is a 24 y.o. [phone] at [redacted]w[redacted]d being seen today for ongoing prenatal care.  She is currently monitored for the following issues for this high-risk pregnancy and has History of preterm delivery, currently pregnant in second trimester; History of preterm premature rupture of membranes (PPROM); Anti-M isoimmunization affecting pregnancy, antepartum; Marijuana use; Supervision of high risk pregnancy, antepartum; and Depression affecting pregnancy on their problem list.  Patient reports hemorrhoids.  Contractions: Not present. Vag. Bleeding: None.  Movement: Present. Denies leaking of fluid.   The following portions of the patient's history were reviewed and updated as appropriate: allergies, current medications, past family history, past medical history, past social history, past surgical history and problem list.   Objective:   Vitals:   06/29/20 0911  BP: 121/78  Pulse: 79  Weight: 128 lb 6.4 oz (58.2 kg)    Fetal Status: Fetal Heart Rate (bpm): 128 Fundal Height: 33 cm Movement: Present     General:  Alert, oriented and cooperative. Patient is in no acute distress.  Skin: Skin is warm and dry. No rash noted.   Cardiovascular: Normal heart rate noted  Respiratory: Normal respiratory effort, no problems with respiration noted  Abdomen: Soft, gravid, appropriate for gestational age.  Pain/Pressure: Absent     Pelvic: Cervical exam deferred        Extremities: Normal range of motion.  Edema: None  Mental Status: Normal mood and affect. Normal behavior. Normal judgment and thought content.   Assessment and Plan:  Pregnancy: [id number] at 108w4d 1. Supervision of high risk pregnancy, antepartum Patient is doing well Rx hemorrhoid provided Patient missed 2 hour glucola and agrees to 1 hour glucola today Third trimester labs today Patient is considering BTL- form signed today  - Hemoglobin A1c - CBC - HIV Antibody (routine testing  w rflx) - RPR - Glucose tolerance, 1 hour  2. History of preterm premature rupture of membranes (PPROM) Patient discontinued 17-P 2 months ago s/p BMZ 8/29-8/30   3. Depression affecting pregnancy Stable  Preterm labor symptoms and general obstetric precautions including but not limited to vaginal bleeding, contractions, leaking of fluid and fetal movement were reviewed in detail with the patient. Please refer to After Visit Summary for other counseling recommendations.   Return in about 2 weeks (around 07/13/2020) for in person, ROB, High risk, gbs.  No future appointments.  Catalina Antigua, MD

## 2020-06-29 NOTE — Progress Notes (Signed)
Pt is here for ROB, [redacted]w[redacted]d. Pt reports that she stopped her Makena injections a couple months ago and does not wish to continue injections. Pt reports she does not want to drink the Glucola and wants to talk to the provider about other options for 2 hour GTT.

## 2020-06-30 LAB — GLUCOSE TOLERANCE, 1 HOUR: Glucose, 1Hr PP: 94 mg/dL (ref 65–199)

## 2020-06-30 LAB — CBC
Hematocrit: 28.1 % — ABNORMAL LOW (ref 34.0–46.6)
Hemoglobin: 9.5 g/dL — ABNORMAL LOW (ref 11.1–15.9)
MCH: 31.4 pg (ref 26.6–33.0)
MCHC: 33.8 g/dL (ref 31.5–35.7)
MCV: 93 fL (ref 79–97)
Platelets: 321 10*3/uL (ref 150–450)
RBC: 3.03 x10E6/uL — ABNORMAL LOW (ref 3.77–5.28)
RDW: 11.8 % (ref 11.7–15.4)
WBC: 14.5 10*3/uL — ABNORMAL HIGH (ref 3.4–10.8)

## 2020-06-30 LAB — HEMOGLOBIN A1C
Est. average glucose Bld gHb Est-mCnc: 108 mg/dL
Hgb A1c MFr Bld: 5.4 % (ref 4.8–5.6)

## 2020-06-30 LAB — HIV ANTIBODY (ROUTINE TESTING W REFLEX): HIV Screen 4th Generation wRfx: NONREACTIVE

## 2020-06-30 LAB — RPR: RPR Ser Ql: NONREACTIVE

## 2020-07-13 ENCOUNTER — Ambulatory Visit (INDEPENDENT_AMBULATORY_CARE_PROVIDER_SITE_OTHER): Payer: Medicaid Other | Admitting: Obstetrics and Gynecology

## 2020-07-13 ENCOUNTER — Encounter: Payer: Self-pay | Admitting: Obstetrics and Gynecology

## 2020-07-13 ENCOUNTER — Other Ambulatory Visit: Payer: Self-pay

## 2020-07-13 VITALS — BP 116/76 | HR 93 | Wt 127.0 lb

## 2020-07-13 DIAGNOSIS — O0990 Supervision of high risk pregnancy, unspecified, unspecified trimester: Secondary | ICD-10-CM

## 2020-07-13 DIAGNOSIS — F329 Major depressive disorder, single episode, unspecified: Secondary | ICD-10-CM

## 2020-07-13 DIAGNOSIS — Z8759 Personal history of other complications of pregnancy, childbirth and the puerperium: Secondary | ICD-10-CM

## 2020-07-13 DIAGNOSIS — O99340 Other mental disorders complicating pregnancy, unspecified trimester: Secondary | ICD-10-CM

## 2020-07-13 MED ORDER — PREPARATION H 0.25-14-74.9 % RE OINT: 1.0000 "application " | TOPICAL_OINTMENT | Freq: Two times a day (BID) | RECTAL | 1 refills | Status: DC | PRN

## 2020-07-13 NOTE — Progress Notes (Signed)
ROB [redacted]w[redacted]d  Pt needs Rx for hemorrhoids states Rx was never sent.    CC:  Vaginal pressure at night.

## 2020-07-13 NOTE — Progress Notes (Signed)
   PRENATAL VISIT NOTE  Subjective:  Sandra Matthews is a 24 y.o. [phone] at [redacted]w[redacted]d being seen today for ongoing prenatal care.  She is currently monitored for the following issues for this high-risk pregnancy and has History of preterm delivery, currently pregnant in second trimester; History of preterm premature rupture of membranes (PPROM); Anti-M isoimmunization affecting pregnancy, antepartum; Marijuana use; Supervision of high risk pregnancy, antepartum; and Depression affecting pregnancy on their problem list.  Patient reports no complaints.  Contractions: Irritability. Vag. Bleeding: None.  Movement: Present. Denies leaking of fluid.   The following portions of the patient's history were reviewed and updated as appropriate: allergies, current medications, past family history, past medical history, past social history, past surgical history and problem list.   Objective:   Vitals:   07/13/20 1551  BP: 116/76  Pulse: 93  Weight: 127 lb (57.6 kg)    Fetal Status: Fetal Heart Rate (bpm): 140 Fundal Height: 35 cm Movement: Present     General:  Alert, oriented and cooperative. Patient is in no acute distress.  Skin: Skin is warm and dry. No rash noted.   Cardiovascular: Normal heart rate noted  Respiratory: Normal respiratory effort, no problems with respiration noted  Abdomen: Soft, gravid, appropriate for gestational age.  Pain/Pressure: Present     Pelvic: Cervical exam deferred        Extremities: Normal range of motion.  Edema: None  Mental Status: Normal mood and affect. Normal behavior. Normal judgment and thought content.   Assessment and Plan:  Pregnancy: [id number] at [redacted]w[redacted]d 1. Supervision of high risk pregnancy, antepartum Patient is doing well without complaints Cultures next visit  2. History of preterm premature rupture of membranes (PPROM) S/p BMZ  3. Depression affecting pregnancy Stable on zoloft  Preterm labor symptoms and general obstetric precautions  including but not limited to vaginal bleeding, contractions, leaking of fluid and fetal movement were reviewed in detail with the patient. Please refer to After Visit Summary for other counseling recommendations.   Return in about 1 week (around 07/20/2020) for in person, ROB, High risk.  No future appointments.  Catalina Antigua, MD

## 2020-07-18 ENCOUNTER — Inpatient Hospital Stay (HOSPITAL_COMMUNITY)
Admission: AD | Admit: 2020-07-18 | Discharge: 2020-07-18 | Disposition: A | Discharge status: Home / Self Care | Payer: Medicaid Other | Attending: Obstetrics and Gynecology | Admitting: Obstetrics and Gynecology

## 2020-07-18 ENCOUNTER — Other Ambulatory Visit: Payer: Self-pay

## 2020-07-18 DIAGNOSIS — Z3689 Encounter for other specified antenatal screening: Secondary | ICD-10-CM

## 2020-07-18 DIAGNOSIS — O0990 Supervision of high risk pregnancy, unspecified, unspecified trimester: Secondary | ICD-10-CM

## 2020-07-18 DIAGNOSIS — O26893 Other specified pregnancy related conditions, third trimester: Secondary | ICD-10-CM | POA: Insufficient documentation

## 2020-07-18 DIAGNOSIS — G44209 Tension-type headache, unspecified, not intractable: Secondary | ICD-10-CM

## 2020-07-18 DIAGNOSIS — R519 Headache, unspecified: Secondary | ICD-10-CM | POA: Insufficient documentation

## 2020-07-18 DIAGNOSIS — Z3A36 36 weeks gestation of pregnancy: Secondary | ICD-10-CM | POA: Diagnosis not present

## 2020-07-18 DIAGNOSIS — O4703 False labor before 37 completed weeks of gestation, third trimester: Secondary | ICD-10-CM | POA: Insufficient documentation

## 2020-07-18 LAB — URINALYSIS, ROUTINE W REFLEX MICROSCOPIC
Bilirubin Urine: NEGATIVE
Glucose, UA: NEGATIVE mg/dL
Hgb urine dipstick: NEGATIVE
Ketones, ur: 20 mg/dL — AB
Leukocytes,Ua: NEGATIVE
Nitrite: NEGATIVE
Protein, ur: NEGATIVE mg/dL
Specific Gravity, Urine: 1.017 (ref 1.005–1.030)
pH: 6 (ref 5.0–8.0)

## 2020-07-18 MED ORDER — ACETAMINOPHEN 500 MG PO TABS
1000.0000 mg | ORAL_TABLET | Freq: Once | ORAL | Status: AC
  Administered 2020-07-18: 1000 mg via ORAL
  Filled 2020-07-18: qty 2

## 2020-07-18 NOTE — MAU Note (Signed)
Keep having pressure, is very uncomfortable. Wanted to come last night. Just feels very uncomfortable. No bleeding or LOF.  Reports +fm.  Been having a HA, took Tylenol, no relief

## 2020-07-18 NOTE — Discharge Instructions (Signed)
Contraception Choices Contraception, also called birth control, refers to methods or devices that prevent pregnancy. Hormonal methods Contraceptive implant  A contraceptive implant is a thin, plastic tube that contains a hormone. It is inserted into the upper part of the arm. It can remain in place for up to 3 years. Progestin-only injections Progestin-only injections are injections of progestin, a synthetic form of the hormone progesterone. They are given every 3 months by a health care provider. Birth control pills  Birth control pills are pills that contain hormones that prevent pregnancy. They must be taken once a day, preferably at the same time each day. Birth control patch  The birth control patch contains hormones that prevent pregnancy. It is placed on the skin and must be changed once a week for three weeks and removed on the fourth week. A prescription is needed to use this method of contraception. Vaginal ring  A vaginal ring contains hormones that prevent pregnancy. It is placed in the vagina for three weeks and removed on the fourth week. After that, the process is repeated with a new ring. A prescription is needed to use this method of contraception. Emergency contraceptive Emergency contraceptives prevent pregnancy after unprotected sex. They come in pill form and can be taken up to 5 days after sex. They work best the sooner they are taken after having sex. Most emergency contraceptives are available without a prescription. This method should not be used as your only form of birth control. Barrier methods Female condom  A female condom is a thin sheath that is worn over the penis during sex. Condoms keep sperm from going inside a woman's body. They can be used with a spermicide to increase their effectiveness. They should be disposed after a single use. Female condom  A female condom is a soft, loose-fitting sheath that is put into the vagina before sex. The condom keeps sperm  from going inside a woman's body. They should be disposed after a single use. Diaphragm  A diaphragm is a soft, dome-shaped barrier. It is inserted into the vagina before sex, along with a spermicide. The diaphragm blocks sperm from entering the uterus, and the spermicide kills sperm. A diaphragm should be left in the vagina for 6-8 hours after sex and removed within 24 hours. A diaphragm is prescribed and fitted by a health care provider. A diaphragm should be replaced every 1-2 years, after giving birth, after gaining more than 15 lb (6.8 kg), and after pelvic surgery. Cervical cap  A cervical cap is a round, soft latex or plastic cup that fits over the cervix. It is inserted into the vagina before sex, along with spermicide. It blocks sperm from entering the uterus. The cap should be left in place for 6-8 hours after sex and removed within 48 hours. A cervical cap must be prescribed and fitted by a health care provider. It should be replaced every 2 years. Sponge  A sponge is a soft, circular piece of polyurethane foam with spermicide on it. The sponge helps block sperm from entering the uterus, and the spermicide kills sperm. To use it, you make it wet and then insert it into the vagina. It should be inserted before sex, left in for at least 6 hours after sex, and removed and thrown away within 30 hours. Spermicides Spermicides are chemicals that kill or block sperm from entering the cervix and uterus. They can come as a cream, jelly, suppository, foam, or tablet. A spermicide should be inserted into the   vagina with an applicator at least 10-15 minutes before sex to allow time for it to work. The process must be repeated every time you have sex. Spermicides do not require a prescription. Intrauterine contraception Intrauterine device (IUD) An IUD is a T-shaped device that is put in a woman's uterus. There are two types:  Hormone IUD.This type contains progestin, a synthetic form of the hormone  progesterone. This type can stay in place for 3-5 years.  Copper IUD.This type is wrapped in copper wire. It can stay in place for 10 years.  Permanent methods of contraception Female tubal ligation In this method, a woman's fallopian tubes are sealed, tied, or blocked during surgery to prevent eggs from traveling to the uterus. Hysteroscopic sterilization In this method, a small, flexible insert is placed into each fallopian tube. The inserts cause scar tissue to form in the fallopian tubes and block them, so sperm cannot reach an egg. The procedure takes about 3 months to be effective. Another form of birth control must be used during those 3 months. Female sterilization This is a procedure to tie off the tubes that carry sperm (vasectomy). After the procedure, the man can still ejaculate fluid (semen). Natural planning methods Natural family planning In this method, a couple does not have sex on days when the woman could become pregnant. Calendar method This means keeping track of the length of each menstrual cycle, identifying the days when pregnancy can happen, and not having sex on those days. Ovulation method In this method, a couple avoids sex during ovulation. Symptothermal method This method involves not having sex during ovulation. The woman typically checks for ovulation by watching changes in her temperature and in the consistency of cervical mucus. Post-ovulation method In this method, a couple waits to have sex until after ovulation. Summary  Contraception, also called birth control, means methods or devices that prevent pregnancy.  Hormonal methods of contraception include implants, injections, pills, patches, vaginal rings, and emergency contraceptives.  Barrier methods of contraception can include female condoms, female condoms, diaphragms, cervical caps, sponges, and spermicides.  There are two types of IUDs (intrauterine devices). An IUD can be put in a woman's uterus to  prevent pregnancy for 3-5 years.  Permanent sterilization can be done through a procedure for males, females, or both.  Natural family planning methods involve not having sex on days when the woman could become pregnant. This information is not intended to replace advice given to you by your health care provider. Make sure you discuss any questions you have with your health care provider. Document Revised: 10/09/2017 Document Reviewed: 11/09/2016 Elsevier Patient Education  2020 Elsevier Inc.  

## 2020-07-18 NOTE — MAU Note (Signed)
I have communicated with Sabas Sous CNM and reviewed vital signs:  Vitals:   07/18/20 1308 07/18/20 1414  BP: 121/79 122/84  Pulse: 91 (!) 114  Resp: 16 18  Temp: 98.6 F (37 C)   SpO2: 100% 100%    Vaginal exam:  Dilation: 2 Effacement (%): 50 Cervical Position: Posterior Station: -2 Presentation: Vertex Exam by:: Johnanna Schneiders RN,   Also reviewed contraction pattern and that non-stress test is reactive.  It has been documented that patient is contracting every 3-5 minutes with no cervical change over 3 hours not indicating active labor.  Patient denies any other complaints.  Based on this report provider has given order for discharge.  A discharge order and diagnosis entered by a provider.   Labor discharge instructions reviewed with patient.

## 2020-07-18 NOTE — MAU Provider Note (Signed)
S: Ms. Sandra Matthews is a 24 y.o. [phone] at [redacted]w[redacted]d  who presents to MAU today for labor evaluation. After 1.5 hours nurse reports patient exam remains the same.  Nurse also reports patient with c/o HA and requests tylenol prior to discharge.      Vitals:   07/18/20 1039 07/18/20 1308  BP: 114/78 121/79  Pulse: 86 91  Resp: 18 16  Temp: 98.5 F (36.9 C) 98.6 F (37 C)  TempSrc: Oral Oral  SpO2: 100% 100%  Weight: 56.2 kg   Height: 5\' 2"  (1.575 m)     Cervical exam by RN:  Dilation: 2 Effacement (%): 50 Cervical Position: Posterior Station: -2 Presentation: Vertex Exam by:: [email or web address] RN  Fetal Monitoring: Baseline: 130 Variability: Moderate Accelerations: Present, 15x15 Decelerations: None Contractions: Irregular  MDM Discussed patient with RN. NST reviewed.  Pain Medication  A: SIUP at [redacted]w[redacted]d  False labor Headache Cat I FT   P: Vitals reviewed and BP normal. Okay for tylenol prior to discharge. 1gram ordered. NST Reactive. Discharge home Patient to follow-up with primary office as scheduled  Patient may return to MAU as needed or when in labor   [redacted]w[redacted]d, Gerrit Heck 07/18/2020 2:01 PM

## 2020-07-21 ENCOUNTER — Ambulatory Visit (INDEPENDENT_AMBULATORY_CARE_PROVIDER_SITE_OTHER): Payer: Medicaid Other | Admitting: Obstetrics and Gynecology

## 2020-07-21 ENCOUNTER — Other Ambulatory Visit: Payer: Self-pay

## 2020-07-21 ENCOUNTER — Other Ambulatory Visit (HOSPITAL_COMMUNITY)
Admission: RE | Admit: 2020-07-21 | Discharge: 2020-07-21 | Disposition: A | Discharge status: Home / Self Care | Payer: Medicaid Other | Source: Ambulatory Visit | Attending: Obstetrics and Gynecology | Admitting: Obstetrics and Gynecology

## 2020-07-21 VITALS — BP 118/74 | HR 89 | Wt 126.8 lb

## 2020-07-21 DIAGNOSIS — Z419 Encounter for procedure for purposes other than remedying health state, unspecified: Secondary | ICD-10-CM | POA: Diagnosis not present

## 2020-07-21 DIAGNOSIS — O0990 Supervision of high risk pregnancy, unspecified, unspecified trimester: Secondary | ICD-10-CM | POA: Insufficient documentation

## 2020-07-21 DIAGNOSIS — F32A Depression, unspecified: Secondary | ICD-10-CM

## 2020-07-21 DIAGNOSIS — Z3A36 36 weeks gestation of pregnancy: Secondary | ICD-10-CM | POA: Insufficient documentation

## 2020-07-21 DIAGNOSIS — O361990 Maternal care for other isoimmunization, unspecified trimester, not applicable or unspecified: Secondary | ICD-10-CM

## 2020-07-21 DIAGNOSIS — Z8759 Personal history of other complications of pregnancy, childbirth and the puerperium: Secondary | ICD-10-CM

## 2020-07-21 DIAGNOSIS — O99340 Other mental disorders complicating pregnancy, unspecified trimester: Secondary | ICD-10-CM

## 2020-07-21 NOTE — Patient Instructions (Signed)

## 2020-07-21 NOTE — Progress Notes (Signed)
Pt is here for ROB, [redacted]w[redacted]d.

## 2020-07-21 NOTE — Progress Notes (Signed)
   PRENATAL VISIT NOTE  Subjective:  Sandra Matthews is a 24 y.o. [phone] at [redacted]w[redacted]d being seen today for ongoing prenatal care.  She is currently monitored for the following issues for this low-risk pregnancy and has History of preterm delivery, currently pregnant in second trimester; History of preterm premature rupture of membranes (PPROM); Anti-M isoimmunization affecting pregnancy, antepartum; Marijuana use; Supervision of high risk pregnancy, antepartum; Depression affecting pregnancy; and [redacted] weeks gestation of pregnancy on their problem list.  Patient doing well with no acute concerns today. She reports no complaints.  Contractions: Irritability. Vag. Bleeding: None.  Movement: Present. Denies leaking of fluid.   The following portions of the patient's history were reviewed and updated as appropriate: allergies, current medications, past family history, past medical history, past social history, past surgical history and problem list. Problem list updated.  Objective:   Vitals:   07/21/20 1133  BP: 118/74  Pulse: 89  Weight: 126 lb 12.8 oz (57.5 kg)    Fetal Status: Fetal Heart Rate (bpm): 140 Fundal Height: 36 cm Movement: Present  Presentation: Vertex  General:  Alert, oriented and cooperative. Patient is in no acute distress.  Skin: Skin is warm and dry. No rash noted.   Cardiovascular: Normal heart rate noted  Respiratory: Normal respiratory effort, no problems with respiration noted  Abdomen: Soft, gravid, appropriate for gestational age.  Pain/Pressure: Present     Pelvic: Cervical exam performed Dilation: 1.5 Effacement (%): 60 Station: -2  Extremities: Normal range of motion.  Edema: None  Mental Status:  Normal mood and affect. Normal behavior. Normal judgment and thought content.   Assessment and Plan:  Pregnancy: [id number] at [redacted]w[redacted]d  1. Supervision of high risk pregnancy, antepartum Routine care - Cervicovaginal ancillary only - Strep Gp B NAA  2. History of  preterm premature rupture of membranes (PPROM)   3. Anti-M isoimmunization affecting pregnancy, antepartum, single or unspecified fetus   4. [redacted] weeks gestation of pregnancy   5. Depression affecting pregnancy   Preterm labor symptoms and general obstetric precautions including but not limited to vaginal bleeding, contractions, leaking of fluid and fetal movement were reviewed in detail with the patient.  Please refer to After Visit Summary for other counseling recommendations.   Return in about 1 week (around 07/28/2020) for ROB, in person.   Mariel Aloe, MD

## 2020-07-23 LAB — STREP GP B NAA: Strep Gp B NAA: POSITIVE — AB

## 2020-07-24 LAB — CERVICOVAGINAL ANCILLARY ONLY
Chlamydia: NEGATIVE
Comment: NEGATIVE
Comment: NORMAL
Neisseria Gonorrhea: NEGATIVE

## 2020-07-27 ENCOUNTER — Encounter: Payer: Medicaid Other | Admitting: Obstetrics and Gynecology

## 2020-07-27 ENCOUNTER — Other Ambulatory Visit: Payer: Self-pay

## 2020-07-27 ENCOUNTER — Encounter (HOSPITAL_COMMUNITY): Payer: Self-pay | Admitting: Obstetrics and Gynecology

## 2020-07-27 ENCOUNTER — Inpatient Hospital Stay (HOSPITAL_COMMUNITY)
Admission: AD | Admit: 2020-07-27 | Discharge: 2020-07-27 | Disposition: A | Discharge status: Home / Self Care | Payer: Medicaid Other | Attending: Obstetrics and Gynecology | Admitting: Obstetrics and Gynecology

## 2020-07-27 DIAGNOSIS — Z3A37 37 weeks gestation of pregnancy: Secondary | ICD-10-CM

## 2020-07-27 DIAGNOSIS — F329 Major depressive disorder, single episode, unspecified: Secondary | ICD-10-CM | POA: Insufficient documentation

## 2020-07-27 DIAGNOSIS — Z79899 Other long term (current) drug therapy: Secondary | ICD-10-CM | POA: Insufficient documentation

## 2020-07-27 DIAGNOSIS — R824 Acetonuria: Secondary | ICD-10-CM | POA: Insufficient documentation

## 2020-07-27 DIAGNOSIS — O99343 Other mental disorders complicating pregnancy, third trimester: Secondary | ICD-10-CM | POA: Diagnosis not present

## 2020-07-27 DIAGNOSIS — R892 Abnormal level of other drugs, medicaments and biological substances in specimens from other organs, systems and tissues: Secondary | ICD-10-CM | POA: Insufficient documentation

## 2020-07-27 DIAGNOSIS — O99323 Drug use complicating pregnancy, third trimester: Secondary | ICD-10-CM | POA: Diagnosis not present

## 2020-07-27 DIAGNOSIS — O212 Late vomiting of pregnancy: Secondary | ICD-10-CM | POA: Diagnosis not present

## 2020-07-27 DIAGNOSIS — O0990 Supervision of high risk pregnancy, unspecified, unspecified trimester: Secondary | ICD-10-CM

## 2020-07-27 DIAGNOSIS — F1290 Cannabis use, unspecified, uncomplicated: Secondary | ICD-10-CM | POA: Diagnosis not present

## 2020-07-27 DIAGNOSIS — Z3689 Encounter for other specified antenatal screening: Secondary | ICD-10-CM

## 2020-07-27 DIAGNOSIS — O26833 Pregnancy related renal disease, third trimester: Secondary | ICD-10-CM | POA: Diagnosis not present

## 2020-07-27 LAB — CBC
HCT: 29.4 % — ABNORMAL LOW (ref 36.0–46.0)
Hemoglobin: 9.4 g/dL — ABNORMAL LOW (ref 12.0–15.0)
MCH: 29.4 pg (ref 26.0–34.0)
MCHC: 32 g/dL (ref 30.0–36.0)
MCV: 91.9 fL (ref 80.0–100.0)
Platelets: 322 10*3/uL (ref 150–400)
RBC: 3.2 MIL/uL — ABNORMAL LOW (ref 3.87–5.11)
RDW: 12.7 % (ref 11.5–15.5)
WBC: 10.5 10*3/uL (ref 4.0–10.5)
nRBC: 0 % (ref 0.0–0.2)

## 2020-07-27 LAB — COMPREHENSIVE METABOLIC PANEL
ALT: 10 U/L (ref 0–44)
AST: 13 U/L — ABNORMAL LOW (ref 15–41)
Albumin: 2.3 g/dL — ABNORMAL LOW (ref 3.5–5.0)
Alkaline Phosphatase: 105 U/L (ref 38–126)
Anion gap: 10 (ref 5–15)
BUN: <5 mg/dL — ABNORMAL LOW (ref 6–20)
CO2: 21 mmol/L — ABNORMAL LOW (ref 22–32)
Calcium: 8.6 mg/dL — ABNORMAL LOW (ref 8.9–10.3)
Chloride: 102 mmol/L (ref 98–111)
Creatinine, Ser: 0.55 mg/dL (ref 0.44–1.00)
GFR calc non Af Amer: >60 mL/min (ref >60)
Glucose, Bld: 79 mg/dL (ref 70–99)
Potassium: 3.9 mmol/L (ref 3.5–5.1)
Sodium: 133 mmol/L — ABNORMAL LOW (ref 135–145)
Total Bilirubin: 0.4 mg/dL (ref 0.3–1.2)
Total Protein: 5.6 g/dL — ABNORMAL LOW (ref 6.5–8.1)

## 2020-07-27 LAB — URINALYSIS, ROUTINE W REFLEX MICROSCOPIC
Bacteria, UA: NONE SEEN
Bilirubin Urine: NEGATIVE
Glucose, UA: NEGATIVE mg/dL
Hgb urine dipstick: NEGATIVE
Ketones, ur: 80 mg/dL — AB
Leukocytes,Ua: NEGATIVE
Nitrite: NEGATIVE
Protein, ur: 30 mg/dL — AB
Specific Gravity, Urine: 1.021 (ref 1.005–1.030)
pH: 6 (ref 5.0–8.0)

## 2020-07-27 LAB — RAPID URINE DRUG SCREEN, HOSP PERFORMED
Amphetamines: NOT DETECTED
Barbiturates: NOT DETECTED
Benzodiazepines: NOT DETECTED
Cocaine: NOT DETECTED
Opiates: POSITIVE — AB
Tetrahydrocannabinol: POSITIVE — AB

## 2020-07-27 MED ORDER — ONDANSETRON 4 MG PO TBDP: 4.0000 mg | ORAL_TABLET | Freq: Four times a day (QID) | ORAL | 3 refills | Status: DC | PRN

## 2020-07-27 MED ORDER — LACTATED RINGERS IV BOLUS
1000.0000 mL | Freq: Once | INTRAVENOUS | Status: AC
  Administered 2020-07-27: 1000 mL via INTRAVENOUS

## 2020-07-27 MED ORDER — M.V.I. ADULT IV INJ
Freq: Once | INTRAVENOUS | Status: AC
  Filled 2020-07-27: qty 10

## 2020-07-27 MED ORDER — ONDANSETRON 4 MG PO TBDP
4.0000 mg | ORAL_TABLET | Freq: Once | ORAL | Status: AC
  Administered 2020-07-27: 4 mg via ORAL
  Filled 2020-07-27: qty 1

## 2020-07-27 NOTE — MAU Note (Signed)
Been throwing up since yesterday, can't keep anything down.  Called the office and was told to come in, may need fluids.  Denies fever or diarrhea.  No one else at home is sick.  Little pain in upper abd last night, no pain this morning.

## 2020-07-27 NOTE — MAU Provider Note (Signed)
History     CSN: [id number]  Arrival date and time: 07/27/20 9323   First Provider Initiated Contact with Patient 07/27/20 0809      Chief Complaint  Patient presents with  . Emesis   HPI Sandra Matthews is a 24 y.o. [phone] at 31w4dwho presents to MAU with chief complaint of recurrent vomiting. Patient endorses new onset vomiting which began yesterday. She endorses vomiting throughout the day and twice this morning. She is unable to tolerate anything PO.  She denies vaginal bleeding, leaking of fluid, decreased fetal movement, fever, falls, or recent illness.   She receives care with CSan PabloFemina  OB History    Gravida  4   Para  3   Term  2   Preterm  1   AB  0   Living  3     SAB  0   TAB  0   Ectopic  0   Multiple  0   Live Births  3           Past Medical History:  Diagnosis Date  . Anemia   . Depression   . Preterm labor     Past Surgical History:  Procedure Laterality Date  . FRACTURE SURGERY     broken arm in childhood, cast only, no surgery    Family History  Problem Relation Age of Onset  . Bipolar disorder Father   . Depression Father   . Anxiety disorder Father   . Arthritis Father   . Diabetes Maternal Grandmother   . Hypertension Maternal Grandfather   . Depression Paternal Grandmother     Social History   Tobacco Use  . Smoking status: Never Smoker  . Smokeless tobacco: Never Used  Vaping Use  . Vaping Use: Former  Substance Use Topics  . Alcohol use: No  . Drug use: Not Currently    Types: Marijuana    Comment: last use late september 2021    Allergies: No Known Allergies  Medications Prior to Admission  Medication Sig Dispense Refill Last Dose  . acetaminophen (TYLENOL) 500 MG tablet Take 1,000 mg by mouth every 6 (six) hours as needed.   Past Week at Unknown time  . Prenatal Vit-Fe Fum-Fe Bisg-FA (NATACHEW) 28-1 MG CHEW Chew 1 tablet by mouth daily. 30 tablet 12 Past Week at Unknown time  . sertraline  (ZOLOFT) 50 MG tablet Take 1 tablet (50 mg total) by mouth daily. Take 1/2 tablet qd x 7 days and then 1 whole tablet qd 30 tablet 5 Past Week at Unknown time  . Blood Pressure Monitoring (BLOOD PRESSURE KIT) DEVI 1 kit by Does not apply route once a week. Check Blood Pressure regularly and record readings into the Babyscripts App.  Large Cuff.  DX O90.0 1 each 0   . ferrous sulfate 325 (65 FE) MG EC tablet Take 1 tablet (325 mg total) by mouth daily with breakfast. (Patient not taking: Reported on 06/29/2020) 45 tablet 3   . ondansetron (ZOFRAN ODT) 4 MG disintegrating tablet Take 1 tablet (4 mg total) by mouth every 6 (six) hours as needed for nausea. (Patient not taking: Reported on 07/21/2020) 20 tablet 1   . phenylephrine-shark liver oil-mineral oil-petrolatum (PREPARATION H) 0.25-14-74.9 % rectal ointment Place 1 application rectally 2 (two) times daily as needed for hemorrhoids. (Patient not taking: Reported on 07/21/2020) 28 g 1     Review of Systems  Constitutional: Positive for fatigue.  Gastrointestinal: Positive for nausea and vomiting.  All other systems reviewed and are negative.  Physical Exam   Blood pressure 114/82, pulse 81, temperature 98.6 F (37 C), temperature source Oral, resp. rate 16, height _0  (1.575 m), weight 58.7 kg, last menstrual period 11/07/2019, SpO2 100 %, unknown if currently breastfeeding.  Physical Exam Vitals and nursing note reviewed. Exam conducted with a chaperone present.  Cardiovascular:     Rate and Rhythm: Normal rate.     Pulses: Normal pulses.     Heart sounds: Normal heart sounds.  Pulmonary:     Effort: Pulmonary effort is normal.     Breath sounds: Normal breath sounds.  Abdominal:     Comments: Gravid  Skin:    General: Skin is warm and dry.     Capillary Refill: Capillary refill takes less than 2 seconds.  Neurological:     General: No focal deficit present.     Mental Status: She is alert.  Psychiatric:        Mood and Affect:  Mood normal.        Behavior: Behavior normal.        Thought Content: Thought content normal.        Judgment: Judgment normal.     MAU Course  Procedures   --Reactive tracing: baseline 135, mod var, + 15 x 15 accels, no decels --Toco: irreg ctx not felt by patient --UDS positive for opiates and THC. Patient adds that she "drank something yesterday that tastes funny"  Orders Placed This Encounter  Procedures  . Urinalysis, Routine w reflex microscopic Urine, Clean Catch  . Rapid urine drug screen (hospital performed)  . Insert peripheral IV   Meds ordered this encounter  Medications  . ondansetron (ZOFRAN-ODT) disintegrating tablet 4 mg  . lactated ringers bolus 1,000 mL  . multivitamins adult (INFUVITE ADULT) 10 mL in lactated ringers 1,000 mL infusion  . ondansetron (ZOFRAN ODT) 4 MG disintegrating tablet    Sig: Take 1 tablet (4 mg total) by mouth every 6 (six) hours as needed for nausea.    Dispense:  20 tablet    Refill:  3    Order Specific Question:   Supervising Provider    Answer:   Griffin Basil [[id number]]   Assessment and Plan  --24 y.o. [phone] at [redacted]w[redacted]d --Reactive tracing --Vomiting in setting of abnormal UDS, patient denies --Tolerating PO prior to discharge --Discharge home in stable condition  SDarlina Rumpf CNM 07/27/2020, 3:20 PM

## 2020-07-27 NOTE — Discharge Instructions (Signed)

## 2020-07-30 ENCOUNTER — Encounter (HOSPITAL_COMMUNITY): Payer: Self-pay | Admitting: Student

## 2020-07-30 ENCOUNTER — Other Ambulatory Visit: Payer: Self-pay

## 2020-07-30 ENCOUNTER — Inpatient Hospital Stay (HOSPITAL_COMMUNITY)
Admission: AD | Admit: 2020-07-30 | Discharge: 2020-08-01 | DRG: 807 | Disposition: A | Discharge status: Home / Self Care | Payer: Medicaid Other | Attending: Obstetrics & Gynecology | Admitting: Obstetrics & Gynecology

## 2020-07-30 ENCOUNTER — Inpatient Hospital Stay (HOSPITAL_COMMUNITY)
Admission: AD | Admit: 2020-07-30 | Discharge: 2020-07-30 | Disposition: A | Discharge status: Home / Self Care | Payer: Self-pay | Attending: Obstetrics and Gynecology | Admitting: Obstetrics and Gynecology

## 2020-07-30 DIAGNOSIS — O99344 Other mental disorders complicating childbirth: Secondary | ICD-10-CM | POA: Diagnosis present

## 2020-07-30 DIAGNOSIS — O26893 Other specified pregnancy related conditions, third trimester: Secondary | ICD-10-CM | POA: Diagnosis not present

## 2020-07-30 DIAGNOSIS — O368130 Decreased fetal movements, third trimester, not applicable or unspecified: Secondary | ICD-10-CM | POA: Diagnosis not present

## 2020-07-30 DIAGNOSIS — O169 Unspecified maternal hypertension, unspecified trimester: Secondary | ICD-10-CM | POA: Diagnosis present

## 2020-07-30 DIAGNOSIS — O99824 Streptococcus B carrier state complicating childbirth: Secondary | ICD-10-CM | POA: Diagnosis not present

## 2020-07-30 DIAGNOSIS — Z20822 Contact with and (suspected) exposure to covid-19: Secondary | ICD-10-CM | POA: Diagnosis present

## 2020-07-30 DIAGNOSIS — F32A Depression, unspecified: Secondary | ICD-10-CM | POA: Diagnosis not present

## 2020-07-30 DIAGNOSIS — Z23 Encounter for immunization: Secondary | ICD-10-CM

## 2020-07-30 DIAGNOSIS — Z3A38 38 weeks gestation of pregnancy: Secondary | ICD-10-CM

## 2020-07-30 DIAGNOSIS — O134 Gestational [pregnancy-induced] hypertension without significant proteinuria, complicating childbirth: Principal | ICD-10-CM | POA: Diagnosis present

## 2020-07-30 LAB — COMPREHENSIVE METABOLIC PANEL
ALT: 11 U/L (ref 0–44)
AST: 16 U/L (ref 15–41)
Albumin: 2.4 g/dL — ABNORMAL LOW (ref 3.5–5.0)
Alkaline Phosphatase: 97 U/L (ref 38–126)
Anion gap: 11 (ref 5–15)
BUN: 7 mg/dL (ref 6–20)
CO2: 20 mmol/L — ABNORMAL LOW (ref 22–32)
Calcium: 8.8 mg/dL — ABNORMAL LOW (ref 8.9–10.3)
Chloride: 103 mmol/L (ref 98–111)
Creatinine, Ser: 0.46 mg/dL (ref 0.44–1.00)
GFR, Estimated: >60 mL/min (ref >60)
Glucose, Bld: 86 mg/dL (ref 70–99)
Potassium: 4 mmol/L (ref 3.5–5.1)
Sodium: 134 mmol/L — ABNORMAL LOW (ref 135–145)
Total Bilirubin: <0.1 mg/dL — ABNORMAL LOW (ref 0.3–1.2)
Total Protein: 5.4 g/dL — ABNORMAL LOW (ref 6.5–8.1)

## 2020-07-30 LAB — CBC
HCT: 34.2 % — ABNORMAL LOW (ref 36.0–46.0)
Hemoglobin: 11 g/dL — ABNORMAL LOW (ref 12.0–15.0)
MCH: 30.3 pg (ref 26.0–34.0)
MCHC: 32.2 g/dL (ref 30.0–36.0)
MCV: 94.2 fL (ref 80.0–100.0)
Platelets: 397 10*3/uL (ref 150–400)
RBC: 3.63 MIL/uL — ABNORMAL LOW (ref 3.87–5.11)
RDW: 13 % (ref 11.5–15.5)
WBC: 17.2 10*3/uL — ABNORMAL HIGH (ref 4.0–10.5)
nRBC: 0 % (ref 0.0–0.2)

## 2020-07-30 LAB — RESPIRATORY PANEL BY RT PCR (FLU A&B, COVID)
Influenza A by PCR: NEGATIVE
Influenza B by PCR: NEGATIVE
SARS Coronavirus 2 by RT PCR: NEGATIVE

## 2020-07-30 LAB — RPR: RPR Ser Ql: NONREACTIVE

## 2020-07-30 MED ORDER — BENZOCAINE-MENTHOL 20-0.5 % EX AERO
1.0000 "application " | INHALATION_SPRAY | CUTANEOUS | Status: DC | PRN
  Administered 2020-07-30: 1 via TOPICAL
  Filled 2020-07-30: qty 56

## 2020-07-30 MED ORDER — IBUPROFEN 600 MG PO TABS
600.0000 mg | ORAL_TABLET | Freq: Four times a day (QID) | ORAL | Status: DC
  Administered 2020-07-30 – 2020-08-01 (×9): 600 mg via ORAL
  Filled 2020-07-30 (×8): qty 1

## 2020-07-30 MED ORDER — SODIUM CHLORIDE 0.9% FLUSH: 3.0000 mL | INTRAVENOUS | Status: DC | PRN

## 2020-07-30 MED ORDER — MEASLES, MUMPS & RUBELLA VAC IJ SOLR: 0.5000 mL | Freq: Once | INTRAMUSCULAR | Status: DC

## 2020-07-30 MED ORDER — SODIUM CHLORIDE 0.9 % IV SOLN: 250.0000 mL | INTRAVENOUS | Status: DC | PRN

## 2020-07-30 MED ORDER — DIPHENHYDRAMINE HCL 25 MG PO CAPS: 25.0000 mg | ORAL_CAPSULE | Freq: Four times a day (QID) | ORAL | Status: DC | PRN

## 2020-07-30 MED ORDER — PRENATAL MULTIVITAMIN CH
1.0000 | ORAL_TABLET | Freq: Every day | ORAL | Status: DC
  Administered 2020-07-30 – 2020-08-01 (×3): 1 via ORAL
  Filled 2020-07-30 (×2): qty 1

## 2020-07-30 MED ORDER — OXYTOCIN BOLUS FROM INFUSION
333.0000 mL | Freq: Once | INTRAVENOUS | Status: AC
  Administered 2020-07-30: 333 mL via INTRAVENOUS

## 2020-07-30 MED ORDER — SOD CITRATE-CITRIC ACID 500-334 MG/5ML PO SOLN: 30.0000 mL | ORAL | Status: DC | PRN

## 2020-07-30 MED ORDER — SODIUM CHLORIDE 0.9% FLUSH: 3.0000 mL | Freq: Two times a day (BID) | INTRAVENOUS | Status: DC

## 2020-07-30 MED ORDER — LACTATED RINGERS IV SOLN: 500.0000 mL | INTRAVENOUS | Status: DC | PRN

## 2020-07-30 MED ORDER — ACETAMINOPHEN 325 MG PO TABS: 650.0000 mg | ORAL_TABLET | ORAL | Status: DC | PRN

## 2020-07-30 MED ORDER — TETANUS-DIPHTH-ACELL PERTUSSIS 5-2.5-18.5 LF-MCG/0.5 IM SUSP
0.5000 mL | Freq: Once | INTRAMUSCULAR | Status: AC
  Administered 2020-07-31: 0.5 mL via INTRAMUSCULAR
  Filled 2020-07-30: qty 0.5

## 2020-07-30 MED ORDER — INFLUENZA VAC SPLIT QUAD 0.5 ML IM SUSY
0.5000 mL | PREFILLED_SYRINGE | INTRAMUSCULAR | Status: AC
  Administered 2020-08-01: 0.5 mL via INTRAMUSCULAR
  Filled 2020-07-30: qty 0.5

## 2020-07-30 MED ORDER — OXYCODONE-ACETAMINOPHEN 5-325 MG PO TABS: 1.0000 | ORAL_TABLET | ORAL | Status: DC | PRN

## 2020-07-30 MED ORDER — SODIUM CHLORIDE 0.9 % IV SOLN
2.0000 g | Freq: Four times a day (QID) | INTRAVENOUS | Status: DC
  Administered 2020-07-30: 2 g via INTRAVENOUS
  Filled 2020-07-30: qty 2000

## 2020-07-30 MED ORDER — OXYCODONE-ACETAMINOPHEN 5-325 MG PO TABS: 2.0000 | ORAL_TABLET | ORAL | Status: DC | PRN

## 2020-07-30 MED ORDER — ONDANSETRON HCL 4 MG PO TABS: 4.0000 mg | ORAL_TABLET | ORAL | Status: DC | PRN

## 2020-07-30 MED ORDER — SERTRALINE HCL 50 MG PO TABS
50.0000 mg | ORAL_TABLET | Freq: Every day | ORAL | Status: DC
  Administered 2020-07-30 – 2020-08-01 (×3): 50 mg via ORAL
  Filled 2020-07-30 (×3): qty 1

## 2020-07-30 MED ORDER — LIDOCAINE HCL (PF) 1 % IJ SOLN: 30.0000 mL | INTRAMUSCULAR | Status: DC | PRN

## 2020-07-30 MED ORDER — OXYCODONE HCL 5 MG PO TABS
5.0000 mg | ORAL_TABLET | ORAL | Status: DC | PRN
  Administered 2020-07-30 – 2020-08-01 (×8): 5 mg via ORAL
  Filled 2020-07-30 (×8): qty 1

## 2020-07-30 MED ORDER — COCONUT OIL OIL
1.0000 "application " | TOPICAL_OIL | Status: DC | PRN
  Administered 2020-07-31: 1 via TOPICAL

## 2020-07-30 MED ORDER — LIDOCAINE HCL (PF) 1 % IJ SOLN
INTRAMUSCULAR | Status: AC
  Filled 2020-07-30: qty 30

## 2020-07-30 MED ORDER — DIBUCAINE (PERIANAL) 1 % EX OINT: 1.0000 "application " | TOPICAL_OINTMENT | CUTANEOUS | Status: DC | PRN

## 2020-07-30 MED ORDER — ONDANSETRON HCL 4 MG/2ML IJ SOLN: 4.0000 mg | INTRAMUSCULAR | Status: DC | PRN

## 2020-07-30 MED ORDER — MEDROXYPROGESTERONE ACETATE 150 MG/ML IM SUSP
150.0000 mg | INTRAMUSCULAR | Status: AC | PRN
  Administered 2020-07-31: 150 mg via INTRAMUSCULAR
  Filled 2020-07-30: qty 1

## 2020-07-30 MED ORDER — SENNOSIDES-DOCUSATE SODIUM 8.6-50 MG PO TABS
1.0000 | ORAL_TABLET | Freq: Two times a day (BID) | ORAL | Status: DC
  Administered 2020-07-30 – 2020-08-01 (×3): 1 via ORAL
  Filled 2020-07-30 (×4): qty 1

## 2020-07-30 MED ORDER — ACETAMINOPHEN 325 MG PO TABS
650.0000 mg | ORAL_TABLET | ORAL | Status: DC | PRN
  Administered 2020-07-30 – 2020-08-01 (×5): 650 mg via ORAL
  Filled 2020-07-30 (×5): qty 2

## 2020-07-30 MED ORDER — LACTATED RINGERS IV SOLN: INTRAVENOUS | Status: DC

## 2020-07-30 MED ORDER — WITCH HAZEL-GLYCERIN EX PADS: 1.0000 "application " | MEDICATED_PAD | CUTANEOUS | Status: DC | PRN

## 2020-07-30 MED ORDER — ERYTHROMYCIN 5 MG/GM OP OINT
TOPICAL_OINTMENT | OPHTHALMIC | Status: AC
  Filled 2020-07-30: qty 1

## 2020-07-30 MED ORDER — OXYTOCIN-SODIUM CHLORIDE 30-0.9 UT/500ML-% IV SOLN
2.5000 [IU]/h | INTRAVENOUS | Status: DC
  Filled 2020-07-30: qty 500

## 2020-07-30 MED ORDER — ONDANSETRON HCL 4 MG/2ML IJ SOLN: 4.0000 mg | Freq: Four times a day (QID) | INTRAMUSCULAR | Status: DC | PRN

## 2020-07-30 NOTE — MAU Note (Signed)
Opened in error

## 2020-07-30 NOTE — H&P (Signed)
Sandra Matthews is a 24 y.o. female [phone] with IUP at 65w0dby LMP presenting for 5 week [place]  She reports positive fetal movemntt. She denies leakage of fluid or vaginal bleeding.  [facility]  Pregnancy complications:  - Past Medical History: Past Medical History:  Diagnosis Date   Anemia    Depression    Preterm labor   -Patient has history of SGA and previous PTD; she had positive FFN on 08/29 and BMZ x 2. Denies any growth problems with this pregnancy.   Past Surgical History: Past Surgical History:  Procedure Laterality Date   FRACTURE SURGERY     broken arm in childhood, cast only, no surgery    Obstetrical History: OB History    Gravida  4   Para  3   Term  2   Preterm  1   AB  0   Living  3     SAB  0   TAB  0   Ectopic  0   Multiple  0   Live Births  3            Social History: Social History   Socioeconomic History   Marital status: Single    Spouse name: Not on file   Number of children: 3   Years of education: Not on file   Highest education level: Not on file  Occupational History   Occupation: UNEMPLOYED  Tobacco Use   Smoking status: Never Smoker   Smokeless tobacco: Never Used  VScientific laboratory technicianUse: Former  Substance and Sexual Activity   Alcohol use: No   Drug use: Not Currently    Types: Marijuana    Comment: last use late september 2021   Sexual activity: Yes    Birth control/protection: None  Other Topics Concern   Not on file  Social History Narrative   Not on file   Social Determinants of Health   Financial Resource Strain:    Difficulty of Paying Living Expenses: Not on file  Food Insecurity:    Worried About RCharity fundraiserin the Last Year: Not on file   RYRC Worldwideof Food in the Last Year: Not on file  Transportation Needs:    Lack of Transportation (Medical): Not on file   Lack of Transportation (Non-Medical): Not on  file  Physical Activity:    Days of Exercise per Week: Not on file   Minutes of Exercise per Session: Not on file  Stress:    Feeling of Stress : Not on file  Social Connections:    Frequency of Communication with Friends and Family: Not on file   Frequency of Social Gatherings with Friends and Family: Not on file   Attends Religious Services: Not on file   Active Member of Clubs or Organizations: Not on file   Attends CArchivistMeetings: Not on file   Marital Status: Not on file    Family History: Family History  Problem Relation Age of Onset   Bipolar disorder Father    Depression Father    Anxiety disorder Father    Arthritis Father    Diabetes Maternal Grandmother    Hypertension Maternal Grandfather    Depression Paternal Grandmother     Allergies: No Known Allergies  Medications Prior to Admission  Medication Sig Dispense Refill Last Dose   acetaminophen (TYLENOL) 500 MG tablet Take 1,000 mg by mouth every 6 (six) hours as  needed.      Blood Pressure Monitoring (BLOOD PRESSURE KIT) DEVI 1 kit by Does not apply route once a week. Check Blood Pressure regularly and record readings into the Babyscripts App.  Large Cuff.  DX O90.0 1 each 0    ferrous sulfate 325 (65 FE) MG EC tablet Take 1 tablet (325 mg total) by mouth daily with breakfast. (Patient not taking: Reported on 06/29/2020) 45 tablet 3    ondansetron (ZOFRAN ODT) 4 MG disintegrating tablet Take 1 tablet (4 mg total) by mouth every 6 (six) hours as needed for nausea. 20 tablet 3    phenylephrine-shark liver oil-mineral oil-petrolatum (PREPARATION H) 0.25-14-74.9 % rectal ointment Place 1 application rectally 2 (two) times daily as needed for hemorrhoids. (Patient not taking: Reported on 07/21/2020) 28 g 1    Prenatal Vit-Fe Fum-Fe Bisg-FA (NATACHEW) 28-1 MG CHEW Chew 1 tablet by mouth daily. 30 tablet 12    sertraline (ZOLOFT) 50 MG tablet Take 1 tablet (50 mg total) by mouth daily.  Take 1/2 tablet qd x 7 days and then 1 whole tablet qd 30 tablet 5     Review of Systems   Constitutional: Negative for fever and chills Eyes: Negative for visual disturbances Respiratory: Negative for shortness of breath, dyspnea Cardiovascular: Negative for chest pain or palpitations  Gastrointestinal: Negative for vomiting, diarrhea and constipation.  POSITIVE for abdominal pain (contractions) Genitourinary: Negative for dysuria and urgency Musculoskeletal: Negative for back pain, joint pain, myalgias  Neurological: Negative for dizziness and headaches  Blood pressure 126/90, pulse (!) 101, temperature (!) 97.5 F (36.4 C), temperature source Oral, resp. rate 16, height '5\' 2"'  (1.575 m), weight 60.2 kg, last menstrual period 11/07/2019, SpO2 98 %, unknown if currently breastfeeding. General appearance: alert, cooperative and no distress Lungs: normal respiratory effort Heart: regular rate and rhythm Abdomen: soft, non-tender; bowel sounds normal Extremities: Homans sign is negative, no sign of DVT DTR's 2+ Presentation: cephalic Fetal monitoring  130 Uterine activity  q 2-3 Dilation: 8 Exam by:: Maye Hides CNM   Prenatal labs: ABO, Rh: O/Positive/-- (05/18 1422) Antibody: Positive, See Final Results (05/18 1422) Rubella: 2.98 (05/18 1422) RPR: Non Reactive (09/09 1100)  HBsAg: Negative (05/18 1422)  HIV: Non Reactive (09/09 1100)  GBS: Positive/-- (10/01 1147)  1 hr Glucola 94 Genetic screening patient reports normal Anatomy US Normal  Prenatal Transfer Tool  Maternal Diabetes: No Genetic Screening: Normal Maternal Ultrasounds/Referrals: Normal Fetal Ultrasounds or other Referrals:  None Maternal Substance Abuse:  Yes:  Type: Other: THC and opiates Significant Maternal Medications:  Meds include: Other: Zofran Significant Maternal Lab Results: Other:   No results found for this or any previous visit (from the past 24 hour(s)).  Assessment: Sandra Matthews is a 24 y.o. [phone] with an IUP at 71w0dpresenting for SOL.   Plan: #Labor: expectant management #Pain:  Per request #FWB Cat 1 #ID: GBS: positive #MOF:  Breast and bottle #MOC: depo before going home #Circ: yes   KStarr Lake10/07/2020, 9:29 AM

## 2020-07-30 NOTE — Lactation Note (Signed)
This note was copied from a baby's chart. Lactation Consultation Note  Patient Name: Sandra Matthews [id number]'M Date: 07/30/2020 Reason for consult: Initial assessment;Infant < 6lbs;Early term 37-38.6wks;Other (Comment) (SGA)  Visited with mom of a 8 hours old ETI female < 6 lbs, she's a P3 and experienced BF. She was able to BF her first baby for only a month but BF her second one for 1-2 years and her 3rd one for 2 years. She participated in the [facility] program at the [facility] and reported she still had the Symphony pump that was given to her at the [facility] office, she forgot to return it. She's familiar with hand expression, LC revised hand expression with mom and she was able to get colostrum, praised her for her efforts.  Baby already nursing when entering the room, but latch was very shallow, he only had the tip of the nipple on his mouth; baby was swaddled. Asked mom's permission to unswadled baby and do STS instead, baby able to latch right away again when repositioned with a few audible swallows noted upon breast compressions. Explained to mom the difference between a shallow latch and a deep one, baby had his lips flanged and was sucking rhythmically. He nursed for a total of 35 minutes, advised mom to try limit future feedings to no more than 30 minutes/time due to baby's birth weight.  LC set up a DEBP instructions, cleaning and storage were reviewed, as well as milk storage guidelines. Reviewed normal newborn behavior, cluster feeding, feeding cues, size of baby's stomach, lactogenesis II, pumping schedule and LPI policy due to baby's birth weight. Mom didn't start pumping right away during [facility] consultation because she was experiencing cramping, but she told LC she'll start pumping at some point tonight.   Feeding plan   1. Encouraged mom to continue nursing on cues 8-12 times/24 hours or sooner if feeding cues are present 2. If baby does not cue by the 3 hours mark, she'll wake him up to feed 3.  She'll start pumping tonight, every 3 hours after feedings 4. Mom will offer baby any amount of EBM she may get. Will reassess tomorrow to determine if baby needs further supplementation  BF brochure, BF resources, feeding diary and LPI handout were reviewed. No support person in mom's room at the time of [facility] consultation. Mom reported all questions and concerns were answered, she's aware of LC OP services and will call PRN.   Maternal Data Formula Feeding for Exclusion: Yes Reason for exclusion: Mother's choice to formula and breast feed on admission Has patient been taught Hand Expression?: Yes Does the patient have breastfeeding experience prior to this delivery?: Yes  Feeding Feeding Type: Breast Fed  LATCH Score Latch: Grasps breast easily, tongue down, lips flanged, rhythmical sucking. (once put STS and was unswaddled)  Audible Swallowing: A few with stimulation  Type of Nipple: Everted at rest and after stimulation  Comfort (Breast/Nipple): Soft / non-tender  Hold (Positioning): Assistance needed to correctly position infant at breast and maintain latch.  LATCH Score: 8  Interventions Interventions: Breast feeding basics reviewed;Assisted with latch;Skin to skin;Breast massage;Hand express;Breast compression;Adjust position;Support pillows;DEBP  Lactation Tools Discussed/Used Tools: Pump;Flanges Flange Size: 24 Breast pump type: Double-Electric Breast Pump WIC Program: Yes Pump Review: Setup, frequency, and cleaning;Milk Storage Initiated by:: MPeck Date initiated:: 07/30/20   Consult Status Consult Status: Follow-up Date: 07/31/20 Follow-up type: In-patient    Sandra Matthews 07/30/2020, 6:31 PM

## 2020-07-30 NOTE — MAU Note (Signed)
Sandra Matthews is a 24 y.o. at [redacted]w[redacted]d here in MAU reporting: contractions since yesterday, states they are close together. No bleeding. Has been leaking fluids since the day before yesterday, states it has been small amounts. DFM.  Onset of complaint: ongoing  Pain score: 10/10  Vitals:   07/30/20 0859  BP: 126/90  Pulse: (!) 101  Resp: 16  Temp: (!) 97.5 F (36.4 C)  SpO2: 98%     FHT:141   Lab orders placed from triage:none

## 2020-07-30 NOTE — Discharge Summary (Signed)
Postpartum Discharge Summary    Patient Name: Sandra Matthews DOB: 1996-01-09 MRN: [id number]  Date of admission: 07/30/2020 Delivery date:07/30/2020  Delivering provider: Zettie Cooley E  Date of discharge: 08/01/2020  Admitting diagnosis: Indication for care in labor and delivery, antepartum [O75.9] Intrauterine pregnancy: [redacted]w[redacted]d    Secondary diagnosis:  Active Problems:   Vaginal delivery   Hypertension in pregnancy, delivered  Additional problems: history of preterm delivery, depression affecting pregnancy   Discharge diagnosis: Term Pregnancy Delivered and Gestational Hypertension                                              Post partum procedures:Depo injection Augmentation: None Complications: None  Hospital course: Onset of Labor With Vaginal Delivery      24y.o. yo [id number] 362w0das admitted in Active Labor on 07/30/2020. Patient had an uncomplicated labor course as follows:  Membrane Rupture Time/Date: 9:55 AM ,07/30/2020   Delivery Method:Vaginal, Spontaneous  Episiotomy: None  Lacerations:  Periurethral  Patient had an uncomplicated postpartum course.  She is ambulating, tolerating a regular diet, passing flatus, and urinating well. Patient is discharged home in stable condition on 08/01/20.  Newborn Data: Birth date:07/30/2020  Birth time:9:59 AM  Gender:Female  Living status:Living  Apgars:8 ,9  Weight:2685 g   Magnesium Sulfate received: No BMZ received: Yes Rhophylac:N/A MMR:N/A T-DaP:declined Flu: No Transfusion:No  Physical exam  Vitals:   07/31/20 0600 07/31/20 1449 07/31/20 2023 08/01/20 0516  BP: 111/79 125/88 123/77 97/67  Pulse: 73 85 82 61  Resp: 18 18    Temp: 98.1 F (36.7 C) 97.9 F (36.6 C) 98.4 F (36.9 C) 98.2 F (36.8 C)  TempSrc: Oral Oral Oral   SpO2: 100% 100%    Weight:      Height:       General: alert, cooperative and no distress Lochia: appropriate Uterine Fundus: firm Incision: N/A DVT Evaluation: No  evidence of DVT seen on physical exam. No significant calf/ankle edema. Labs: Lab Results  Component Value Date   WBC 17.2 (H) 07/30/2020   HGB 11.0 (L) 07/30/2020   HCT 34.2 (L) 07/30/2020   MCV 94.2 07/30/2020   PLT 397 07/30/2020   CMP Latest Ref Rng & Units 07/30/2020  Glucose 70 - 99 mg/dL 86  BUN 6 - 20 mg/dL 7  Creatinine 0.44 - 1.00 mg/dL 0.46  Sodium 135 - 145 mmol/L 134(L)  Potassium 3.5 - 5.1 mmol/L 4.0  Chloride 98 - 111 mmol/L 103  CO2 22 - 32 mmol/L 20(L)  Calcium 8.9 - 10.3 mg/dL 8.8(L)  Total Protein 6.5 - 8.1 g/dL 5.4(L)  Total Bilirubin 0.3 - 1.2 mg/dL <0.1(L)  Alkaline Phos 38 - 126 U/L 97  AST 15 - 41 U/L 16  ALT 0 - 44 U/L 11   Edinburgh Score: Edinburgh Postnatal Depression Scale Screening Tool 07/31/2020  I have been able to laugh and see the funny side of things. 0  I have looked forward with enjoyment to things. 1  I have blamed myself unnecessarily when things went wrong. 1  I have been anxious or worried for no good reason. 0  I have felt scared or panicky for no good reason. 0  Things have been getting on top of me. 1  I have been so unhappy that I have had difficulty sleeping. 0  I  have felt sad or miserable. 1  I have been so unhappy that I have been crying. 1  The thought of harming myself has occurred to me. 1  Edinburgh Postnatal Depression Scale Total 6     After visit meds:  Allergies as of 08/01/2020   No Known Allergies     Medication List    TAKE these medications   acetaminophen 500 MG tablet Commonly known as: TYLENOL Take 1,000 mg by mouth every 6 (six) hours as needed.   Blood Pressure Kit Devi 1 kit by Does not apply route once a week. Check Blood Pressure regularly and record readings into the Babyscripts App.  Large Cuff.  DX O90.0   ferrous sulfate 325 (65 FE) MG EC tablet Take 1 tablet (325 mg total) by mouth daily with breakfast.   ibuprofen 600 MG tablet Commonly known as: ADVIL Take 1 tablet (600 mg total)  by mouth every 6 (six) hours as needed for moderate pain or cramping.   NataChew 28-1 MG Chew Chew 1 tablet by mouth daily.   ondansetron 4 MG disintegrating tablet Commonly known as: Zofran ODT Take 1 tablet (4 mg total) by mouth every 6 (six) hours as needed for nausea.   Preparation H 0.25-14-74.9 % rectal ointment Generic drug: phenylephrine-shark liver oil-mineral oil-petrolatum Place 1 application rectally 2 (two) times daily as needed for hemorrhoids.   sertraline 50 MG tablet Commonly known as: Zoloft Take 1 tablet (50 mg total) by mouth daily. Take 1/2 tablet qd x 7 days and then 1 whole tablet qd        Discharge home in stable condition Infant Feeding: Breast Infant Disposition:home with mother Discharge instruction: per After Visit Summary and Postpartum booklet. Activity: Advance as tolerated. Pelvic rest for 6 weeks.  Diet: routine diet Future Appointments: Future Appointments  Date Time Provider [place]  08/04/2020 10:40 AM [place] None  08/29/2020 11:00 AM Cephas Darby, MD [place] None   Follow up Visit:  [place]. Schedule an appointment as soon as possible for a visit in 4 week(s).   Specialty: Obstetrics and Gynecology Why: Make appointment to be seen in 4 weeks for postpartum care Contact information: [address] [place] [phone]             Please schedule this patient for a Virtual postpartum visit in 4 weeks with the following provider: Any provider. Additional Postpartum F/U:BP check 2-3 days  High risk pregnancy complicated by: HTN Delivery mode:  Vaginal, Spontaneous  Anticipated Birth Control:  Depo IP   08/01/2020 Lajean Manes, CNM

## 2020-07-31 NOTE — Progress Notes (Signed)
Patient handed the nurse her EPDS at the end of the RN's shift.  When nurse entered it into the computer she noted that the patient answered with a 1 on question #10.  Patient has been seen by SW, but an order for an additional SW consult was entered and a phone message was left for SW.  Patient's behavior has been unremarkable, except that at one point today she was tearful which she attributed to her pain level.  Delice Bison [place] Charity fundraiser

## 2020-07-31 NOTE — Clinical Social Work Maternal (Signed)
CLINICAL SOCIAL WORK MATERNAL/CHILD NOTE  Patient Details  Name: Sandra Matthews MRN: [id number] Date of Birth: [birth year]  Date:  07/31/2020  Clinical Social Worker Initiating Note:  Darra Lis, [place] Date/Time: Initiated:  07/31/20/0900     Child's Name:  Undecided   Biological Parents:  Mother   Need for Interpreter:  None   Reason for Referral:  Current Substance Use/Substance Use During Pregnancy , Behavioral Health Concerns   Address:  [place] [address]    Phone number:  [phone] (home)     Additional phone number:   Household Members/Support Persons (HM/SP):   Household Member/Support Person 1, Household Member/Support Person 2, Household Member/Support Person 3, Household Member/Support Person 4   HM/SP Name Relationship DOB or Age  HM/SP -1 Sandra Matthews Significant Other 09/06/1993  HM/SP -2 Sandra Matthews Son 03/09/2011  HM/SP -3 Sandra Matthews Daughter 03/24/2016  HM/SP -4 Sandra Matthews Daughter 03/17/2018  HM/SP -5        HM/SP -6        HM/SP -7        HM/SP -8          Natural Supports (not living in the home):  Immediate Family   Professional Supports: None   Employment: Unemployed   Type of Work:     Education:      Homebound arranged:    Museum/gallery curator Resources:  Medicaid   Other Resources:  Physicist, medical , [place] Considerations Which May Impact Care:    Strengths:  Ability to meet basic needs , Engineer, materials, Home prepared for child    Psychotropic Medications:         Pediatrician:    Solicitor area  Pediatrician List:   [facility]      Pediatrician Fax Number:    Risk Factors/Current Problems:  Mental Health Concerns , Substance Use    Cognitive State:  Alert    Mood/Affect:  Calm , Interested , Relaxed    CSW Assessment: CSW  consulted for hx of depression, THC & Opiate use. CSW met with MOB to complete assessment and provide support. CSW introduced self and role. CSW observed MOB breast feeding baby. MOB was appropriate and engaged throughout interaction. CSW informed MOB of reason for consult. MOB stated she resides with FOB Sandra Matthews and her 3 children. CSW asked MOB about her mental health history. MOB disclosed she has a history of anxiety and depression. MOB stated she has never officially been diagnosed but started taking Zoloft for the depression at 5 months. MOB stated the Zoloft has been helpful for both the depression and anxiety. CSW asked MOB if she has attended therapy, MOB stated no. MOB expressed she is interested in therapy for both herself and her oldest child. MOB further expressed she believes her oldest child could benefit from therapy to address emotions related to her split from his father when he was a child. MOB stated she is not experiencing any SI, HI or DV. MOB expressed aside from FOB, her mother is a support. MOB expressed overall she is feeling good at this time.   CSW provided education regarding the baby blues period vs. perinatal mood disorders, discussed treatment and gave resources for mental health follow up if concerns arise.  CSW provided MOB resources for youth counseling, providing  MOB with a Public librarian and [employer] kid Haematologist. CSW recommends self-evaluation during the postpartum time period using the New Mom Checklist from Postpartum Progress and encouraged MOB to contact a medical professional if symptoms are noted at any time.    CSW provided review of Sudden Infant Death Syndrome (SIDS) precautions. MOB informed education on the risk of co-sleeping. MOB states baby will sleep in bedside basinet.  CSW asked MOB about her substance use history. MOB stated she used THC for appetite and she most recently used last week. MOB stated she is aware she was  positive for opiates and she is unsure why. MOB stated she was hanging with friends and drank a fruit punch that tasted funny. CSW asked MOB if the friends were using opiates, MOB stated some of them were using opiates. CSW asked MOB if she took anything while with the group of friends that may have caused her to be positive, she stated no. MOB stated she never saw anyone put anything in her drink, but that is the only thing she could consider. CSW asked MOB if she has ever used opiates in the past, MOB stated no. CSW informed MOB of hospital drug screen policy, stating baby will receive a UDS and CDS. MOB expressed understanding. CSW informed MOB that baby UDS was positive for [facility] and a CPS report will have to be made with [facility] CPS. MOB expressed understanding and stated the same thing occurred with the birth of her daughter in 2019. MOB stated [facility] CPS completed an assessment and it was closed within the week. MOB denies any additional CPS history. MOB denied having any additional question regarding the drug screen policy.  MOB stated she has all of the essential needs for baby to discharge home, including a brand new carseat. MOB utilizes [employer], receiving both WIC and FS. Baby will receive follow-up care at [facility]. MOB stated she uses DSS transportation and there are no barriers.  CSW made report with [place] for positive UDS. CSW will continue to monitor CDS and follow-up with CPS if warranted.   CSW identifies no further need for intervention and no barriers to discharge at this time.    CSW Plan/Description:  Hospital Drug Screen Policy Information, Child Protective Service Report , Perinatal Mood and Anxiety Disorder (PMADs) Education, No Further Intervention Required/No Barriers to Discharge, Sudden Infant Death Syndrome (SIDS) Education, Other Information/Referral to [employer], CSW Will Continue to Monitor Umbilical  Cord Tissue Drug Screen Results and Make Report if Larey Days 07/31/2020, 9:31 AM

## 2020-07-31 NOTE — Social Work (Signed)
CSW received consult due to patient scoring a 1 on question 10 of the [place] Postnatal Depression screening.  CSW completed assessment with patient earlier in the day, but met with patient to follow-up on Edinburgh Score.  CSW observed MOB holding baby and appearing appropriate in interaction with baby. MOB was observed laughing with friends who were also present in the room. CSW politely asked friends to exit the room in order to speak with MOB privately. CSW informed baby of reason for consult and asked MOB if she is currently having thoughts of harming herself, MOB stated no. CSW asked MOB what led to her answering questions number 10 with a 1. MOB stated she thought the question was asking if she has ever felt that way in her life. MOB stated she has not recently had thoughts of self harm and reiterated she does currently does not have those thoughts. CSW asked MOB how she is currently feeling, MOB stated she is feeling good and doing okay as of now.  MOB is still in possession of mental health resources provided earlier in the day. MOB expressed no additional needs or concerns at this time.   Darra Lis, [place] Worker Women's and [employer] [phone]

## 2020-07-31 NOTE — Progress Notes (Signed)
Post Partum Day # 1  Subjective: Pt without complaints this morning. Ambulating, voiding, tolerating diet and good oral pain control.   Objective: Blood pressure 111/79, pulse 73, temperature 98.1 F (36.7 C), temperature source Oral, resp. rate 18, height 5\' 2"  (1.575 m), weight 60.2 kg, last menstrual period 11/07/2019, SpO2 100 %, unknown if currently breastfeeding.  Physical Exam:  General: alert Lochia: appropriate Uterine Fundus: firm Incision: healing well DVT Evaluation: No evidence of DVT seen on physical exam.  Recent Labs    07/30/20 1006  HGB 11.0*  HCT 34.2*    Assessment/Plan: Stable Desires circ Continue with progressive care Plan d/c tomorrow.    Patient desires circumcision for her female infant.  Circumcision procedure details discussed, risks and benefits of procedure were also discussed.  These include but are not limited to: Benefits of circumcision in men include reduction in the rates of urinary tract infection (UTI), penile cancer, some sexually transmitted infections, penile inflammatory and retractile disorders, as well as easier hygiene.  Risks include bleeding , infection, injury of glans which may lead to penile deformity or urinary tract issues, unsatisfactory cosmetic appearance and other potential complications related to the procedure.  It was emphasized that this is an elective procedure.  Patient wants to proceed with circumcision; written informed consent obtained.  Will do circumcision soon, routine circumcision and post circumcision care ordered for the infant.     LOS: 1 day   09/29/20 07/31/2020, 10:46 AM

## 2020-07-31 NOTE — Lactation Note (Signed)
This note was copied from a baby's chart. Lactation Consultation Note  Patient Name: Sandra Matthews [id number] Date: 07/31/2020 Reason for consult: Follow-up assessment;Early term 37-38.6wks  Follow up with 31 hours old infant. Mother states breastfeeding is going well. Infant has a circumcision and has been very sleepy. Encouraged to undress infant and change diaper to stimulate. Infant started to show hunger cues. Mother latched infant to left breast cradle position. Provided pillows for support. Observed good breast tissue movement, suckling and swallowing.   Mother inquires about what formula to use as a supplement at home. Encouraged mother to ask pediatrician during follow up tomorrow.   Encouraged to contact [facility] for support, questions or concerns.    All questions answered at this time.    Maternal Data Formula Feeding for Exclusion: No  Feeding Feeding Type: Breast Fed  LATCH Score Latch: Grasps breast easily, tongue down, lips flanged, rhythmical sucking.  Audible Swallowing: Spontaneous and intermittent  Type of Nipple: Everted at rest and after stimulation  Comfort (Breast/Nipple): Soft / non-tender  Hold (Positioning): Assistance needed to correctly position infant at breast and maintain latch.  LATCH Score: 9  Interventions Interventions: Support pillows;Breast feeding basics reviewed  Consult Status Consult Status: Follow-up Date: 08/01/20 Follow-up type: In-patient    Akiel Fennell A Higuera Ancidey 07/31/2020, 5:25 PM

## 2020-07-31 NOTE — Social Work (Signed)
CSW spoke with [place] CPS Intake [place] [place] and confirmed case is accepted. Case assigned with 72 hours to respond.   CSW will continue to monitor CDS and follow-up with CPS if warranted. CSW identifies no further need for intervention and no barriers to discharge at this time.  Mansour Balboa, LCSWA Clinical Social Worker [facility] 

## 2020-08-01 MED ORDER — IBUPROFEN 600 MG PO TABS: 600.0000 mg | ORAL_TABLET | Freq: Four times a day (QID) | ORAL | 0 refills | Status: DC | PRN

## 2020-08-01 NOTE — Lactation Note (Signed)
This note was copied from a baby's chart. Lactation Consultation Note  Patient Name: Boy Laiah Pouncey SWHQP'R Date: 08/01/2020 Reason for consult: Follow-up assessment   Mother is a P4, infant is 67 hours old and is now at 9 % wt loss.   Mother reports that infant is breast feeding well, but mother states that infant only takes small amts of formula.   Mother was observed with infant latched on at the left breast. Observed infant suckling with audible swallows. Infant sustained latch for 35 mins.  Discussed treatment and prevention of engorgement.   Reviewed LPI supplemental sheet with mother again. Encouraged mother to offer infant formula , at least 20 ml according to guidelines. after breastfeeding and then pump.   Plan of Care : Breastfeed infant with feeding cues Supplement infant with ebm/formula, according to supplemental guidelines. Pump using a DEBP after each feeding for 15-20 mins.   Mother has a Symphony pump  At home and was given a harmony hand pump.  Mother to continue to cue base feed infant and feed at least 8-12 times or more in 24 hours and advised to allow for cluster feeding infant as needed.   Mother to continue to due STS. Mother is aware of available LC services at [facility], BFSG'S, OP Dept, and phone # for questions or concerns about breastfeeding.  Mother receptive to all teaching and plan of care.     Maternal Data Reason for exclusion: Mother's choice to formula and breast feed on admission Does the patient have breastfeeding experience prior to this delivery?: Yes  Feeding Feeding Type: Breast Fed  LATCH Score Latch: Grasps breast easily, tongue down, lips flanged, rhythmical sucking.  Audible Swallowing: A few with stimulation  Type of Nipple: Everted at rest and after stimulation  Comfort (Breast/Nipple): Soft / non-tender  Hold (Positioning): Assistance needed to correctly position infant at breast and maintain latch.  LATCH Score:  8  Interventions Interventions: Hand pump;DEBP  Lactation Tools Discussed/Used     Consult Status Consult Status: Complete    Michel Bickers 08/01/2020, 10:16 AM

## 2020-08-02 ENCOUNTER — Ambulatory Visit: Payer: Self-pay

## 2020-08-02 NOTE — Lactation Note (Signed)
This note was copied from a baby's chart. Lactation Consultation Note  Patient Name: [name] Date: 08/02/2020 Reason for consult: Follow-up assessment;Early term 37-38.6wks  Follow up with 72 hours old infant, 8.16% weight loss of a P4 mother. Infant is sleeping in mother's arms upon arrival. Mother states breastfeeding is going well and does not reports any problems at this point. Mother is supplementing with formula ~13 mL.  Reviewed importance to offer the breast 8 to 12 times in a 24-hour period for proper stimulation and to establish good milk supply. Talked about good output as sign of good intake. Discussed milk coming to volume. Reviewed newborn behavior and expectations with mother  Encouraged to contact [facility] services for support, questions or concerns.    All questions answered at this time.    Feeding Feeding Type: (P) Bottle Fed - Formula Nipple Type: (P) Extra Slow Flow  Interventions Interventions: Breast feeding basics reviewed  Consult Status Consult Status: Complete Date: 08/02/20 Follow-up type: Call as needed    [name] 08/02/2020, 10:50 AM

## 2020-08-03 LAB — TYPE AND SCREEN
ABO/RH(D): O POS
Antibody Screen: POSITIVE
Donor AG Type: NEGATIVE
Donor AG Type: NEGATIVE
Unit division: 0
Unit division: 0

## 2020-08-03 LAB — BPAM RBC
Blood Product Expiration Date: 202111052359
Blood Product Expiration Date: 202111062359
Unit Type and Rh: 5100
Unit Type and Rh: 5100

## 2020-08-04 ENCOUNTER — Other Ambulatory Visit: Payer: Self-pay

## 2020-08-04 ENCOUNTER — Ambulatory Visit (INDEPENDENT_AMBULATORY_CARE_PROVIDER_SITE_OTHER): Payer: Medicaid Other

## 2020-08-04 VITALS — BP 119/78 | HR 83 | Wt 112.0 lb

## 2020-08-04 DIAGNOSIS — Z0130 Encounter for examination of blood pressure without abnormal findings: Secondary | ICD-10-CM

## 2020-08-04 NOTE — Progress Notes (Signed)
Subjective:  Sandra Matthews is a 24 y.o. female here for BP check.   Hypertension ROS: taking medications as instructed, no medication side effects noted, no TIA's, no chest pain on exertion, no dyspnea on exertion and no swelling of ankles.    Objective:  BP 119/78   Pulse 83   Wt 112 lb (50.8 kg)   LMP 11/07/2019   Breastfeeding Yes   BMI 20.49 kg/m   Appearance alert, well appearing, and in no distress. General exam BP noted to be well controlled today in office.    Assessment:   Blood Pressure well controlled.   Plan:  Current treatment is effective no changes necessary. Keep PP appointment.

## 2020-08-14 ENCOUNTER — Telehealth: Payer: Self-pay | Admitting: *Deleted

## 2020-08-14 NOTE — Telephone Encounter (Signed)
Received call on Friday making office aware of pt BP reading at home visit. Pt BP was 121/89, P 81, pt is having no symptoms and doing well. Nurse left BP cuff and instruction with pt.

## 2020-08-21 DIAGNOSIS — Z419 Encounter for procedure for purposes other than remedying health state, unspecified: Secondary | ICD-10-CM | POA: Diagnosis not present

## 2020-08-29 ENCOUNTER — Telehealth (INDEPENDENT_AMBULATORY_CARE_PROVIDER_SITE_OTHER): Payer: Medicaid Other | Admitting: Obstetrics and Gynecology

## 2020-08-29 ENCOUNTER — Encounter: Payer: Self-pay | Admitting: Obstetrics and Gynecology

## 2020-08-29 NOTE — Progress Notes (Signed)
I connected with@ on 08/29/20 at 11:00 AM EST by: Mychart video and verified that I am speaking with the correct person using two identifiers.  Patient is located at home and provider is located at [employer] for [employer] at [place].     The purpose of this virtual visit is to provide medical care while limiting exposure to the novel coronavirus. I discussed the limitations, risks, security and privacy concerns of performing an evaluation and management service by a virtual appointment and the availability of in person appointments. I also discussed with the patient that there may be a patient responsible charge related to this service. By engaging in this virtual visit, you consent to the provision of healthcare.  Additionally, you authorize for your insurance to be billed for the services provided during this visit.  The patient expressed understanding and agreed to proceed.  The following staff members participated in the virtual visit:  Kelby Aline, MD and Lewayne Bunting, CMA    Post Partum Visit Note Subjective:   Sandra Matthews is a 24 y.o. [phone] female being evaluated for postpartum followup.  She is 4 weeks postpartum following a normal spontaneous vaginal delivery at  [redacted] wks gestational weeks.  I have fully reviewed the prenatal and intrapartum course; pregnancy complicated by nothing.  Postpartum course has been unremarkable. Baby is doing well. Baby is feeding by both breast and bottle - Similac Advance. Bleeding spotting. Bowel function is normal. Bladder function is normal. Patient is not sexually active. Contraception method is Depo-Provera injections. Postpartum depression screening: negative. EPDS = 2   The pregnancy intention screening data noted above was reviewed. Potential methods of contraception were discussed. The patient elected to proceed with Depo Hormonal Injection.   The following portions of the patient's history were reviewed and updated as  appropriate: allergies, current medications, past family history, past medical history, past social history, past surgical history and problem list.  Review of Systems A comprehensive review of systems was negative.   Objective:  There were no vitals filed for this visit. Self-Obtained       Assessment:    Normal postpartum exam.  Plan:  Essential components of care per ACOG recommendations:  1.  Mood and well being: Patient with negative depression screening today. Reviewed local resources for support.  - Patient does not use tobacco. If using tobacco we discussed reduction and for recently cessation risk of relapse - hx of drug use? No   2. Infant care and feeding:  -Patient currently breastmilk feeding? Yes If breastmilk feeding discussed return to work and pumping. If needed, patient was provided letter for work to allow for every 2-3 hr pumping breaks, and to be granted a private location to express breastmilk and refrigerated area to store breastmilk. Reviewed importance of draining breast regularly to support lactation. -Social determinants of health (SDOH) reviewed in EPIC. No concerns  3. Sexuality, contraception and birth spacing - Patient does not want a pregnancy in the next year.  Desired family size is unknown number of children.  - Reviewed forms of contraception in tiered fashion. Patient desired Depo-Provera today.   - Discussed birth spacing of 18 months  4. Sleep and fatigue -Encouraged family/partner/community support of 4 hrs of uninterrupted sleep to help with mood and fatigue  5. Physical Recovery  - Discussed patients delivery - Patient has urinary incontinence? No  - Patient is safe to resume physical and sexual activity in 2 weeks.    6.  Health Maintenance - Last pap smear done 12/17/17 and was normal with negative HPV.  20 minutes of non-face-to-face time spent with the patient    Darrin Nipper. Gerri Spore, MD Center for [employer], [facility]

## 2020-09-20 DIAGNOSIS — Z419 Encounter for procedure for purposes other than remedying health state, unspecified: Secondary | ICD-10-CM | POA: Diagnosis not present

## 2020-10-21 DIAGNOSIS — Z419 Encounter for procedure for purposes other than remedying health state, unspecified: Secondary | ICD-10-CM | POA: Diagnosis not present

## 2020-10-30 ENCOUNTER — Ambulatory Visit: Payer: Medicaid Other | Admitting: Advanced Practice Midwife

## 2020-11-16 ENCOUNTER — Ambulatory Visit: Payer: Medicaid Other

## 2020-11-21 ENCOUNTER — Ambulatory Visit: Payer: Medicaid Other

## 2020-11-21 DIAGNOSIS — Z419 Encounter for procedure for purposes other than remedying health state, unspecified: Secondary | ICD-10-CM | POA: Diagnosis not present

## 2020-12-19 DIAGNOSIS — Z419 Encounter for procedure for purposes other than remedying health state, unspecified: Secondary | ICD-10-CM | POA: Diagnosis not present

## 2021-01-19 DIAGNOSIS — Z419 Encounter for procedure for purposes other than remedying health state, unspecified: Secondary | ICD-10-CM | POA: Diagnosis not present

## 2021-01-30 ENCOUNTER — Ambulatory Visit: Payer: Medicaid Other

## 2021-02-18 DIAGNOSIS — Z419 Encounter for procedure for purposes other than remedying health state, unspecified: Secondary | ICD-10-CM | POA: Diagnosis not present

## 2021-03-21 DIAGNOSIS — Z419 Encounter for procedure for purposes other than remedying health state, unspecified: Secondary | ICD-10-CM | POA: Diagnosis not present

## 2021-04-10 DIAGNOSIS — Z20822 Contact with and (suspected) exposure to covid-19: Secondary | ICD-10-CM | POA: Diagnosis not present

## 2021-04-20 DIAGNOSIS — Z419 Encounter for procedure for purposes other than remedying health state, unspecified: Secondary | ICD-10-CM | POA: Diagnosis not present

## 2021-04-24 DIAGNOSIS — Z20822 Contact with and (suspected) exposure to covid-19: Secondary | ICD-10-CM | POA: Diagnosis not present

## 2021-04-27 DIAGNOSIS — Z20822 Contact with and (suspected) exposure to covid-19: Secondary | ICD-10-CM | POA: Diagnosis not present

## 2021-05-02 DIAGNOSIS — Z20822 Contact with and (suspected) exposure to covid-19: Secondary | ICD-10-CM | POA: Diagnosis not present

## 2021-05-09 DIAGNOSIS — Z20822 Contact with and (suspected) exposure to covid-19: Secondary | ICD-10-CM | POA: Diagnosis not present

## 2021-05-13 DIAGNOSIS — Z20822 Contact with and (suspected) exposure to covid-19: Secondary | ICD-10-CM | POA: Diagnosis not present

## 2021-05-21 DIAGNOSIS — Z419 Encounter for procedure for purposes other than remedying health state, unspecified: Secondary | ICD-10-CM | POA: Diagnosis not present

## 2021-05-22 DIAGNOSIS — Z20822 Contact with and (suspected) exposure to covid-19: Secondary | ICD-10-CM | POA: Diagnosis not present

## 2021-05-26 DIAGNOSIS — Z20822 Contact with and (suspected) exposure to covid-19: Secondary | ICD-10-CM | POA: Diagnosis not present

## 2021-06-21 DIAGNOSIS — Z419 Encounter for procedure for purposes other than remedying health state, unspecified: Secondary | ICD-10-CM | POA: Diagnosis not present

## 2021-07-21 DIAGNOSIS — Z419 Encounter for procedure for purposes other than remedying health state, unspecified: Secondary | ICD-10-CM | POA: Diagnosis not present

## 2021-08-01 ENCOUNTER — Other Ambulatory Visit (HOSPITAL_COMMUNITY)
Admission: RE | Admit: 2021-08-01 | Discharge: 2021-08-01 | Disposition: A | Discharge status: Home / Self Care | Payer: Medicaid Other | Source: Ambulatory Visit | Attending: Obstetrics and Gynecology | Admitting: Obstetrics and Gynecology

## 2021-08-01 ENCOUNTER — Encounter: Payer: Self-pay | Admitting: Obstetrics and Gynecology

## 2021-08-01 ENCOUNTER — Ambulatory Visit (INDEPENDENT_AMBULATORY_CARE_PROVIDER_SITE_OTHER): Payer: Medicaid Other | Admitting: Obstetrics and Gynecology

## 2021-08-01 ENCOUNTER — Other Ambulatory Visit: Payer: Self-pay

## 2021-08-01 VITALS — BP 129/86 | HR 96 | Wt 131.0 lb

## 2021-08-01 DIAGNOSIS — Z113 Encounter for screening for infections with a predominantly sexual mode of transmission: Secondary | ICD-10-CM | POA: Insufficient documentation

## 2021-08-01 DIAGNOSIS — Z01419 Encounter for gynecological examination (general) (routine) without abnormal findings: Secondary | ICD-10-CM | POA: Insufficient documentation

## 2021-08-01 NOTE — Progress Notes (Signed)
Patient last pap smear was  12/17/2017 and was negative. No Birth control STI testing today

## 2021-08-01 NOTE — Progress Notes (Signed)
   WELL-WOMAN PHYSICAL & PAP Patient name: Sandra Matthews MRN [id number]  Date of birth: 1996-06-26 Chief Complaint:   Gynecologic Exam  History of Present Illness:   Sandra Matthews is a 25 y.o. [phone] African American female being seen today for a routine well-woman exam.  Current complaints: requesting STD testing  PCP: [facility]      does not desire labs Patient's last menstrual period was 07/08/2021 (approximate). The current method of family planning is none.  Last pap 12/17/2017. Results were: normal Last mammogram: n/a. Family h/o breast cancer: No Last colonoscopy: n/a. Family h/o colorectal cancer: No Review of Systems:   Pertinent items are noted in HPI Denies any headaches, blurred vision, fatigue, shortness of breath, chest pain, abdominal pain, abnormal vaginal discharge/itching/odor/irritation, problems with periods, bowel movements, urination, or intercourse unless otherwise stated above. Pertinent History Reviewed:  Reviewed past medical,surgical, social and family history.  Reviewed problem list, medications and allergies. Physical Assessment:   Vitals:   08/01/21 1000  BP: 129/86  Pulse: 96  Weight: 131 lb (59.4 kg)  Body mass index is 23.96 kg/m.        Physical Examination:   General appearance - well appearing, and in no distress  Mental status - alert, oriented to person, place, and time  Psych:  She has a normal mood and affect  Skin - warm and dry, normal color, no suspicious lesions noted  Chest - effort normal, all lung fields clear to auscultation bilaterally  Heart - normal rate and regular rhythm  Neck:  midline trachea, no thyromegaly or nodules  Breasts - breasts appear normal, no suspicious masses, no skin or nipple changes or  axillary nodes  Abdomen - soft, nontender, nondistended, no masses or organomegaly  Pelvic - VULVA: normal appearing vulva with no masses, tenderness or lesions  VAGINA: normal  appearing vagina with normal color and discharge, no lesions  CERVIX: normal appearing cervix without discharge or lesions, no CMT  Thin prep pap is done reflex HR HPV cotesting  UTERUS: uterus is felt to be normal size, shape, consistency and nontender   ADNEXA: No adnexal masses or tenderness noted.  Rectal - deferred  Extremities:  No swelling or varicosities noted  No results found for this or any previous visit (from the past 24 hour(s)).  Assessment & Plan:  1) Well-Woman Exam with Pap  2) Encounter for well woman exam with routine gynecological exam  - Cytology - PAP( [facility])  3) Screen for STD (sexually transmitted disease)  - Cervicovaginal ancillary only( [place]),  - HIV Antibody (routine testing w rflx),  - Hepatitis C Antibody,  - RPR,  - HSV 2 antibody, IgG,  - Hepatitis B surface antigen  Labs/procedures today: pap and STI testing  Mammogram at age 27 or sooner if problems Colonoscopy at age 51 or sooner if problems  Orders Placed This Encounter  Procedures   HIV Antibody (routine testing w rflx)   Hepatitis C Antibody   RPR   HSV 2 antibody, IgG   Hepatitis B surface antigen    Meds: No orders of the defined types were placed in this encounter.   Follow-up: Return in about 1 year (around 08/01/2022) for Annual Exam.  Raelyn Mora MSN, CNM 08/01/2021 10:31 AM

## 2021-08-02 ENCOUNTER — Other Ambulatory Visit: Payer: Self-pay | Admitting: Obstetrics and Gynecology

## 2021-08-02 ENCOUNTER — Encounter: Payer: Self-pay | Admitting: Obstetrics

## 2021-08-02 DIAGNOSIS — B9689 Other specified bacterial agents as the cause of diseases classified elsewhere: Secondary | ICD-10-CM

## 2021-08-02 LAB — HIV ANTIBODY (ROUTINE TESTING W REFLEX): HIV Screen 4th Generation wRfx: NONREACTIVE

## 2021-08-02 LAB — CERVICOVAGINAL ANCILLARY ONLY
Bacterial Vaginitis (gardnerella): POSITIVE — AB
Candida Glabrata: NEGATIVE
Candida Vaginitis: NEGATIVE
Chlamydia: NEGATIVE
Comment: NEGATIVE
Comment: NEGATIVE
Comment: NEGATIVE
Comment: NEGATIVE
Comment: NEGATIVE
Comment: NORMAL
Neisseria Gonorrhea: NEGATIVE
Trichomonas: NEGATIVE

## 2021-08-02 LAB — RPR: RPR Ser Ql: NONREACTIVE

## 2021-08-02 LAB — HEPATITIS C ANTIBODY: Hep C Virus Ab: <0.1 s/co ratio (ref 0.0–0.9)

## 2021-08-02 LAB — HSV 2 ANTIBODY, IGG: HSV 2 IgG, Type Spec: 21.5 index — ABNORMAL HIGH (ref 0.00–0.90)

## 2021-08-02 MED ORDER — METRONIDAZOLE 500 MG PO TABS: 500.0000 mg | ORAL_TABLET | Freq: Two times a day (BID) | ORAL | 0 refills | Status: DC

## 2021-08-03 ENCOUNTER — Telehealth: Payer: Self-pay | Admitting: *Deleted

## 2021-08-03 NOTE — Telephone Encounter (Signed)
Pt called to office to discuss results in further detail. Discussed with pt and answered all questions.

## 2021-08-08 LAB — CYTOLOGY - PAP
Comment: NEGATIVE
Diagnosis: UNDETERMINED — AB
High risk HPV: POSITIVE — AB

## 2021-08-21 DIAGNOSIS — Z419 Encounter for procedure for purposes other than remedying health state, unspecified: Secondary | ICD-10-CM | POA: Diagnosis not present

## 2021-08-27 ENCOUNTER — Ambulatory Visit: Payer: Medicaid Other | Admitting: Advanced Practice Midwife

## 2021-09-06 ENCOUNTER — Encounter: Payer: Medicaid Other | Admitting: Obstetrics and Gynecology

## 2021-09-17 ENCOUNTER — Encounter: Payer: Self-pay | Admitting: Obstetrics and Gynecology

## 2021-09-17 ENCOUNTER — Other Ambulatory Visit: Payer: Self-pay

## 2021-09-17 ENCOUNTER — Other Ambulatory Visit (HOSPITAL_COMMUNITY)
Admission: RE | Admit: 2021-09-17 | Discharge: 2021-09-17 | Disposition: A | Discharge status: Home / Self Care | Payer: Medicaid Other | Source: Ambulatory Visit | Attending: Advanced Practice Midwife | Admitting: Advanced Practice Midwife

## 2021-09-17 ENCOUNTER — Ambulatory Visit (INDEPENDENT_AMBULATORY_CARE_PROVIDER_SITE_OTHER): Payer: Medicaid Other | Admitting: Obstetrics and Gynecology

## 2021-09-17 VITALS — BP 123/84 | HR 90 | Ht 62.0 in | Wt 133.9 lb

## 2021-09-17 DIAGNOSIS — R87610 Atypical squamous cells of undetermined significance on cytologic smear of cervix (ASC-US): Secondary | ICD-10-CM | POA: Diagnosis not present

## 2021-09-17 DIAGNOSIS — T385X5A Adverse effect of other estrogens and progestogens, initial encounter: Secondary | ICD-10-CM | POA: Diagnosis not present

## 2021-09-17 DIAGNOSIS — R87612 Low grade squamous intraepithelial lesion on cytologic smear of cervix (LGSIL): Secondary | ICD-10-CM | POA: Diagnosis not present

## 2021-09-17 DIAGNOSIS — R888 Abnormal findings in other body fluids and substances: Secondary | ICD-10-CM | POA: Diagnosis not present

## 2021-09-17 NOTE — Progress Notes (Signed)
    GYNECOLOGY OFFICE COLPOSCOPY PROCEDURE NOTE  25 y.o. 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   Pregnancy test:  negative Gardasil:  completed. Discussed role for HPV in cervical dysplasia, need for surveillance.  Informed consent and review of risks, benefit and alternatives performed. Written consent given.   Speculum inserted into patient's vagina assuring full view of cervix and vaginal walls. 3 swabs of vinegar solution applied to the cervix and vaginal walls and colposcope was used to observe both the cervix and vaginal walls.   Colposcopy adequate? Yes  no mosaicism, no punctation, no abnormal vasculature, and acetowhite lesion(s) noted at 1 o'clock; corresponding biopsies obtained.  ECC collected.  All specimens were labeled and sent to pathology. Anticipate CIN1 or normal.   Silver nitrate applied to biopsy sites for good hemostasis and speculum removed.  Pt tolerated well with minimal pain and bleeding.   Patient was given post procedure instructions.  Will follow up pathology and manage accordingly; patient will be contacted with results and recommendations.  Routine preventative health maintenance measures emphasized.   Milas Hock, MD, FACOG Obstetrician & Gynecologist, [facility] for [facility], [facility]

## 2021-09-19 LAB — SURGICAL PATHOLOGY

## 2021-09-20 DIAGNOSIS — Z419 Encounter for procedure for purposes other than remedying health state, unspecified: Secondary | ICD-10-CM | POA: Diagnosis not present

## 2021-10-21 DIAGNOSIS — Z419 Encounter for procedure for purposes other than remedying health state, unspecified: Secondary | ICD-10-CM | POA: Diagnosis not present

## 2021-11-21 DIAGNOSIS — Z419 Encounter for procedure for purposes other than remedying health state, unspecified: Secondary | ICD-10-CM | POA: Diagnosis not present

## 2021-12-19 DIAGNOSIS — Z419 Encounter for procedure for purposes other than remedying health state, unspecified: Secondary | ICD-10-CM | POA: Diagnosis not present

## 2022-01-19 DIAGNOSIS — Z419 Encounter for procedure for purposes other than remedying health state, unspecified: Secondary | ICD-10-CM | POA: Diagnosis not present

## 2022-01-22 ENCOUNTER — Ambulatory Visit (HOSPITAL_COMMUNITY)
Admission: EM | Admit: 2022-01-22 | Discharge: 2022-01-22 | Disposition: A | Discharge status: Home / Self Care | Payer: Medicaid Other | Attending: Psychiatry | Admitting: Psychiatry

## 2022-01-22 DIAGNOSIS — F1110 Opioid abuse, uncomplicated: Secondary | ICD-10-CM | POA: Insufficient documentation

## 2022-01-22 DIAGNOSIS — R45851 Suicidal ideations: Secondary | ICD-10-CM | POA: Insufficient documentation

## 2022-01-22 DIAGNOSIS — F32A Depression, unspecified: Secondary | ICD-10-CM | POA: Insufficient documentation

## 2022-01-22 DIAGNOSIS — F1994 Other psychoactive substance use, unspecified with psychoactive substance-induced mood disorder: Secondary | ICD-10-CM

## 2022-01-22 NOTE — Discharge Summary (Signed)
Sandra Matthews to be D/C'd Home per NP order. An After Visit Summary was printed and given to the patient by provider. Patient escorted out, and D/C home via private auto.  ?Sandra Matthews  Sandra Matthews  ?01/22/2022 3:27 PM ?  ?   ?

## 2022-01-22 NOTE — ED Provider Notes (Signed)
Behavioral Health Urgent Care Medical Screening Exam ? ?Patient Name: Sandra Matthews ?MRN: [id number] ?Date of Evaluation: 01/22/22 ?Chief Complaint:   ?Diagnosis:  ?Final diagnoses:  ?None  ? ? ?History of Present illness: Sandra Matthews Sandra Matthews is a 26 y.o. female patient presented to [facility] as a walk in alone with complaints of increased depression and opioid abuse.  ? ?Sandra Matthews, 26 y.o., female patient seen face to face by this provider, consulted with Dr. Bronwen Betters; and chart reviewed on 01/22/22.  ? ?On evaluation Sandra Matthews reports she was diagnosed with depression during her pregnancies. She was prescribed Zoloft, but no longer takes it. She lives alone with her 4 children ages 5, 34, 44, and 1.  Reports she has little support.  She denies any history of inpatient psychiatric admissions.  She denies any suicide attempts in the past.  Reports a family history of Bipolar Disorder. She is currently unemployed.  She has no outpatient psychiatric services in place. ? ?Patient reports using Percocet 10-15 mg tablets, 8-10 pills throughout the day 2-3 times per week. States she buys medications off of people.  Reports she has been using Percocet since 02/22/2016 and she has used every day in the past.  Her last use was 2 days ago.  States she is currently feeling a little "jittery".  Patient is requesting detox.  She denies all other substance use. ? ?During evaluation Sandra Matthews is alert/oriented x4 and cooperative.  She is fairly groomed and makes fleeting eye contact.  Her speech is at a clear tone, moderate volume, and normal pace.  She endorses increased depression and anxiety over the past 3 weeks.  She endorses feelings of hopelessness, helplessness, decreased motivation and decreased focus.  States her Dad passed away in 2021-02-21.  She has a depressed affect.  She endorses passive suicidal ideations, states, "just sick of being here sometimes".  She denies any intent, plan, or  access to means.  She contracts for safety.  Reports her children as her protective factors.  She denies AVH/HI.  She does not appear to be responding to internal/external stimuli. ? ?Discussed admission to [facility]. Patient states she is willing to be admitted but not today. States she may be willing to return tomorrow.  ?   ?At this time Sandra Matthews is educated and verbalizes understanding of mental health resources and other crisis services in the community. She is instructed to call 911 and present to the nearest emergency room should she experience any suicidal/homicidal ideation, auditory/visual/hallucinations, or detrimental worsening of her mental health condition.  Shewas a also advised by Clinical research associate that she could call the toll-free phone on insurance card to assist with identifying in network counselors and agencies or number on back of Medicaid card to speak with care coordinator.  ?  ? ?Psychiatric Specialty Exam ? ?Presentation  ?General Appearance:Appropriate for Environment; Fairly Groomed ? ?Eye Contact:Good ? ?Speech:Clear and Coherent; Normal Rate ? ?Speech Volume:Normal ? ?Handedness:Right ? ? ?Mood and Affect  ?Mood:Anxious; Depressed ? ?Affect:Congruent ? ? ?Thought Process  ?Thought Processes:Coherent ? ?Descriptions of Associations:Intact ? ?Orientation:Full (Time, Place and Person) ? ?Thought Content:Logical ?   Hallucinations:None ? ?Ideas of Reference:None ? ?Suicidal Thoughts:Yes, Passive ?Without Intent; Without Plan; Without Access to Means ? ?Homicidal Thoughts:No ? ? ?Sensorium  ?Memory:Immediate Good; Remote Good; Recent Good ? ?Judgment:Fair ? ?Insight:Good ? ? ?Executive Functions  ?Concentration:Good ? ?Attention Span:Good ? ?Recall:Good ? ?Fund of Knowledge:Good ? ?Language:Good ? ? ?Psychomotor Activity  ?  Psychomotor Activity:Normal ? ? ?Assets  ?Assets:Communication Skills; Desire for Improvement; Financial Resources/Insurance; Physical Health; Resilience; Leisure  Time ? ? ?Sleep  ?Sleep:Fair ? ?Number of hours: No data recorded ? ?No data recorded ? ?Physical Exam: ?Physical Exam ?Vitals and nursing note reviewed.  ?Constitutional:   ?   General: She is not in acute distress. ?   Appearance: Normal appearance. She is not ill-appearing.  ?HENT:  ?   Head: Normocephalic.  ?Eyes:  ?   General:     ?   Right eye: No discharge.     ?   Left eye: No discharge.  ?   Conjunctiva/sclera: Conjunctivae normal.  ?Cardiovascular:  ?   Rate and Rhythm: Normal rate.  ?Pulmonary:  ?   Effort: Pulmonary effort is normal. No respiratory distress.  ?Musculoskeletal:     ?   General: Normal range of motion.  ?   Cervical back: Normal range of motion.  ?Skin: ?   Coloration: Skin is not jaundiced or pale.  ?Neurological:  ?   Mental Status: She is alert and oriented to person, place, and time.  ?Psychiatric:     ?   Attention and Perception: Attention and perception normal.     ?   Mood and Affect: Affect normal. Mood is anxious and depressed.     ?   Speech: Speech normal.     ?   Behavior: Behavior is cooperative.     ?   Thought Content: Thought content includes suicidal ideation. Thought content does not include suicidal plan.     ?   Cognition and Memory: Cognition normal.     ?   Judgment: Judgment is impulsive.  ? ?Review of Systems  ?Constitutional: Negative.   ?HENT: Negative.    ?Eyes: Negative.   ?Respiratory: Negative.    ?Cardiovascular: Negative.   ?Musculoskeletal: Negative.   ?Skin: Negative.   ?Neurological: Negative.   ?Psychiatric/Behavioral:  Positive for depression, substance abuse and suicidal ideas. The patient is nervous/anxious.   ?Blood pressure 131/85, pulse 97, temperature 98.9 ?F (37.2 ?C), temperature source Oral, resp. rate 19, SpO2 100 %, currently breastfeeding. There is no height or weight on file to calculate BMI. ? ?Musculoskeletal: ?Strength & Muscle Tone: within normal limits ?Gait & Station: normal ?Patient leans: N/A ? ? ?[facility] MSE Discharge Disposition for  Follow up and Recommendations: ?Based on my evaluation the patient does not appear to have an emergency medical condition and can be discharged with resources and follow up care in outpatient services for Medication Management, Substance Abuse Intensive Outpatient Program, and Individual Therapy ? ?Discharge patient  ? ?Discussed admission to [facility]. Patient states she is willing to be admitted but not today. States she may be willing to return tomorrow.  ? ?Provided out patient resources for substance abuse treatment.  ? ?Provided resources for outpatient medication management and therapy for Behavioral health on the 2cd floor including open access walk in hours.  ? ?No evidence of imminent risk to self or others at present.    ?Patient does not meet criteria for psychiatric inpatient admission. ?Discussed crisis plan, support from social network, calling 911, coming to the Emergency Department, and calling Suicide Hotline.  ? ?Ardis Hughs, NP ?01/22/2022, 3:15 PM ? ?

## 2022-01-22 NOTE — Discharge Instructions (Addendum)
Patient is instructed prior to discharge to: Take all medications as prescribed by his/her mental healthcare provider. ?Report any adverse effects and or reactions from the medicines to his/her outpatient provider promptly. ?Patient has been instructed & cautioned: To not engage in alcohol and or illegal drug use while on prescription medicines. ?In the event of worsening symptoms, patient is instructed to call the crisis hotline, 911 and or go to the nearest ED for appropriate evaluation and treatment of symptoms. ?To follow-up with his/her primary care provider for your other medical issues, concerns and or health care needs. ?Substance Abuse Treatment Programs ? ?Intensive Outpatient Programs ?[facility]     ?[address]      ?[place], [place]                   ?[phone]      ? ?[facility] ?[address] ?[place], [place] ?[phone] ? ?[facility]     ?(Inpatient and outpatient)     ?[address]           ?[phone]   ? ?[facility] ?[phone] (Suboxone and Methadone) ? ?[address]      ?[place], [place] [address]      ?[phone]      ? ?[address] ?[place], [place] ?[phone] ? ?[facility] (Outpatient/Inpatient, [facility])    ?(insurance only) [phone]      ?       ?[facility] (Groups & Residential) ?[place], [place] ?[phone] ? ?   ?[facility]     ?[address]     ?[place], [place]      ?[phone]      ? ?[facility] (for caregivers and family) ?[address] ?[place], [place] ?[phone] ? ? ? ? ? ?Residential Treatment Programs ?[facility]      ?[address], [place], [place] [address]  ?[phone]      ? ?[facility] ?[address] [place], [place] [address] ?[phone] ? ?[facility]        ?[phone]      ? ?[facility] ?[phone] ? ?[facility]             ?[address]                                          ?[place], [place]                                                ?[phone] or [phone]                              ? ?[facility] [place] ?[address] ?[place], [address] ?[phone] ? ?[facility]    ?[address]      ?[place], [place]     ?[phone]      ? ?[facility] ?[address] ?[place], [place] ?[phone] ? ?[facility]   ?[address]     ?[place],  [address]     ?[phone]      ?  Admissions: 8am-3pm M-F ? ?[facility] ([facility] ?[address] ?[place], [place] ?[phone] ? ?[facility]: Residential Program (90 Days)   ?[place], [place]      ?[phone] or [phone]    ? ?[facility]: [facility] ?[place], [place] ?(Walk in Hours over the weekend or by referral) ? ?[place] [facility] ?[address], [place], [place] [address] ?[phone] ? ?Crisis Mobile: [facility]:  1-877-626-1772 (for crisis response 24 hours a day) ?[facility] Hotline:      1-800-256-2452 ?Outpatient Psychiatry and Counseling ? ?[facility]: Mobile Crisis Management 24 hours:  1-877-626-1772 ? ?[facility] sliding scale fee and walk in schedule: M-F 8am-12pm/1pm-3pm ?[address]  ?[place], [place] [address] ?[phone] ? ?[facility] ?[address] ?[place], [place] [address] ?[phone] ? ?[facility] ([facility] new patient walk-in appointments available Monday - Friday 8am -3pm.          ?[address] ?[place], [place] [address] ?[phone] or crisis line- [phone] ? ?[facility]/ Intensive Outpatient Therapy Program ?[address] ?[place], [place] [address] ?[phone] ? ?[facility]                  ?Crisis Services      ?[phone]      ?[address]     ?[place], [place]  [address]                ? ?[facility]   ?[facility] ?[phone] ?[address] ?[place], [place] [address] ? ? ?[facility]          ?[address],  ?[place], [place] [address]       ?[phone] ? ?[facility] ?[address] ?[place], [place] [address] ?[phone] ? ?[facility]    ?[address]    ?[place], [place] [address]     ?[phone]      ? ?Parish McKinney, MD     ?[address]     ?[place] [address]     ?[phone]     ?  ?[facility] ?[address] ?[place] [address] ? ?[facility]     ?[address]     ?[place], [place]      ?[phone]      ? ?[facility] ?Shamsher Ahluwalia, MD ?[address] ?[place], [place] [address] ?[phone] ? ?[facility]     ?[address]     ?[place], [place] [address]     ?[phone]      ? ?[facility] ?[address] ?[place], [place] [address] ?[phone] ?Resources for Temporary Residential Assistance/Crisis Centers ? ?DAY CENTERS ?[facility] ?M-F 8am-3pm   ?[address] [place], [place] [address]   [phone] ?Services include: laundry, barbering, support groups, case management, phone  & computer access, showers, AA/NA mtgs, mental health/substance abuse nurse, job skills class, disability information, VA assistance, spiritual classes, etc.  ? ?HOMELESS SHELTERS ? ?[facility]     ?[facility]   ?[address], [place]     ?[phone]       ?       ?[facility] (women and children)       ?[address] ?,    [address] ?[phone] ?[email or web address] for application and process ?Application Required ? ?[facility]   ?[address]    ?[place] [address]     ?[phone]       ?             ?[facility] ?[address] ?[place], [place] [address] ?[phone] ?[phone](schedule application appt.) ?Application Required ? ?[facility] (women only)    ?[address]     ?[place], [place] [address]     ?[phone]      ?Intake starts 6pm daily ?Need valid ID, SSC, & Police report ?[facility] [place] ?[address] ?[place], [place] ?[phone] ?Application Required ? ?[facility] (men only)     ?[address]      ?[place], [place]     ?[phone]      ? ?[facility] ?(Pregnant women only) ?[address] ?[place], [place] ?[phone] ? ?[facility]      ?[address]      ?[place], [place] [address]     ?[phone]      ?       ?[place] [facility] ?[address] ?[place], [place] ?[phone] ?90 day commitment/SA/Application process ? ?[facility](men only)     ?[address]     ?[place], [place]     ?[phone]       ?Check-in at 7pm     ?       ?[facility] ?[address] ?[place], [place] [address] ?[phone] ?Men/Women/Women and Children must be there by 7 pm ? ?[facility] ?[place], [place] ?[phone]                ? ?   ?

## 2022-01-22 NOTE — BH Assessment (Signed)
Sandra Matthews reports feeling "overwhelmed, sad, don't want to be here but I don't want to kill myself". Pt reports worsening depression and anxiety. Stress from being a single mom of 4 kids. Pt reports family history of mental health issues. Pt reports using percocets 2-3 days a week about 8 pills total since 2017. Pt reports withdrawal symptoms of feeling shaky, weak,, vomiting, feeling sick and hot flashes.  Pt does not have outpatient services.  ?

## 2022-02-17 ENCOUNTER — Telehealth (HOSPITAL_COMMUNITY): Payer: Self-pay | Admitting: Pediatrics

## 2022-02-17 NOTE — BH Assessment (Signed)
Care Management - [facility] Follow Up Discharges  ? ?Writer attempted to make contact with patient today and was unsuccessful.  Voicemail is not set up. ? ?Per chart review, patient was provided with outpatient substance abuse resources. ? ?

## 2022-02-18 DIAGNOSIS — Z419 Encounter for procedure for purposes other than remedying health state, unspecified: Secondary | ICD-10-CM | POA: Diagnosis not present

## 2022-03-21 DIAGNOSIS — Z419 Encounter for procedure for purposes other than remedying health state, unspecified: Secondary | ICD-10-CM | POA: Diagnosis not present

## 2022-04-20 DIAGNOSIS — Z419 Encounter for procedure for purposes other than remedying health state, unspecified: Secondary | ICD-10-CM | POA: Diagnosis not present

## 2022-05-16 ENCOUNTER — Ambulatory Visit: Payer: Medicaid Other | Admitting: Student

## 2022-05-21 DIAGNOSIS — Z419 Encounter for procedure for purposes other than remedying health state, unspecified: Secondary | ICD-10-CM | POA: Diagnosis not present

## 2022-06-21 DIAGNOSIS — Z419 Encounter for procedure for purposes other than remedying health state, unspecified: Secondary | ICD-10-CM | POA: Diagnosis not present

## 2022-07-21 DIAGNOSIS — Z419 Encounter for procedure for purposes other than remedying health state, unspecified: Secondary | ICD-10-CM | POA: Diagnosis not present

## 2022-08-15 ENCOUNTER — Ambulatory Visit (INDEPENDENT_AMBULATORY_CARE_PROVIDER_SITE_OTHER): Payer: Medicaid Other

## 2022-08-15 ENCOUNTER — Other Ambulatory Visit (HOSPITAL_COMMUNITY)
Admission: RE | Admit: 2022-08-15 | Discharge: 2022-08-15 | Disposition: A | Discharge status: Home / Self Care | Payer: Medicaid Other | Source: Ambulatory Visit | Attending: Obstetrics and Gynecology | Admitting: Obstetrics and Gynecology

## 2022-08-15 DIAGNOSIS — N898 Other specified noninflammatory disorders of vagina: Secondary | ICD-10-CM | POA: Diagnosis not present

## 2022-08-15 NOTE — Progress Notes (Signed)
Patient was assessed and managed by nursing staff during this encounter. I have reviewed the chart and agree with the documentation and plan. I have also made any necessary editorial changes.  Besnik Febus A Eletha Culbertson, MD 08/15/2022 10:20 AM   

## 2022-08-15 NOTE — Progress Notes (Signed)
SUBJECTIVE:  26 y.o. female complains of white, malodorous, and thick vaginal discharge for 3-4 day(s). Also complains of having vaginal irritation. Denies abnormal vaginal bleeding or significant pelvic pain or fever. No UTI symptoms. Denies history of known exposure to STD.  No LMP recorded.  OBJECTIVE:  She appears well, afebrile. Urine dipstick: not done.  ASSESSMENT:  Vaginal Discharge  Vaginal Odor   PLAN:  GC, chlamydia, trichomonas, BVAG, CVAG probe sent to lab. Treatment: To be determined once lab results are received ROV prn if symptoms persist or worsen.

## 2022-08-16 LAB — CERVICOVAGINAL ANCILLARY ONLY
Bacterial Vaginitis (gardnerella): POSITIVE — AB
Candida Glabrata: NEGATIVE
Candida Vaginitis: NEGATIVE
Chlamydia: NEGATIVE
Comment: NEGATIVE
Comment: NEGATIVE
Comment: NEGATIVE
Comment: NEGATIVE
Comment: NEGATIVE
Comment: NORMAL
Neisseria Gonorrhea: NEGATIVE
Trichomonas: NEGATIVE

## 2022-08-19 ENCOUNTER — Other Ambulatory Visit: Payer: Self-pay

## 2022-08-19 DIAGNOSIS — N760 Acute vaginitis: Secondary | ICD-10-CM

## 2022-08-19 MED ORDER — METRONIDAZOLE 500 MG PO TABS: 500.0000 mg | ORAL_TABLET | Freq: Two times a day (BID) | ORAL | 0 refills | Status: DC

## 2022-08-21 DIAGNOSIS — Z419 Encounter for procedure for purposes other than remedying health state, unspecified: Secondary | ICD-10-CM | POA: Diagnosis not present

## 2022-09-11 ENCOUNTER — Inpatient Hospital Stay (HOSPITAL_COMMUNITY)
Admission: AD | Admit: 2022-09-11 | Discharge: 2022-09-12 | Disposition: A | Discharge status: Home / Self Care | Payer: Medicaid Other | Attending: Obstetrics and Gynecology | Admitting: Obstetrics and Gynecology

## 2022-09-11 DIAGNOSIS — O26851 Spotting complicating pregnancy, first trimester: Secondary | ICD-10-CM | POA: Insufficient documentation

## 2022-09-11 DIAGNOSIS — Z3490 Encounter for supervision of normal pregnancy, unspecified, unspecified trimester: Secondary | ICD-10-CM

## 2022-09-11 DIAGNOSIS — O26891 Other specified pregnancy related conditions, first trimester: Secondary | ICD-10-CM | POA: Insufficient documentation

## 2022-09-11 DIAGNOSIS — Z3A01 Less than 8 weeks gestation of pregnancy: Secondary | ICD-10-CM | POA: Insufficient documentation

## 2022-09-12 ENCOUNTER — Inpatient Hospital Stay (HOSPITAL_COMMUNITY): Payer: Medicaid Other

## 2022-09-12 ENCOUNTER — Encounter (HOSPITAL_COMMUNITY): Payer: Self-pay | Admitting: *Deleted

## 2022-09-12 DIAGNOSIS — R109 Unspecified abdominal pain: Secondary | ICD-10-CM

## 2022-09-12 DIAGNOSIS — O26891 Other specified pregnancy related conditions, first trimester: Secondary | ICD-10-CM | POA: Diagnosis not present

## 2022-09-12 DIAGNOSIS — O26851 Spotting complicating pregnancy, first trimester: Secondary | ICD-10-CM | POA: Diagnosis not present

## 2022-09-12 DIAGNOSIS — Z3A01 Less than 8 weeks gestation of pregnancy: Secondary | ICD-10-CM | POA: Diagnosis not present

## 2022-09-12 DIAGNOSIS — O209 Hemorrhage in early pregnancy, unspecified: Secondary | ICD-10-CM | POA: Diagnosis not present

## 2022-09-12 LAB — URINALYSIS, ROUTINE W REFLEX MICROSCOPIC
Bilirubin Urine: NEGATIVE
Glucose, UA: NEGATIVE mg/dL
Ketones, ur: 5 mg/dL — AB
Nitrite: NEGATIVE
Protein, ur: 30 mg/dL — AB
Specific Gravity, Urine: 1.028 (ref 1.005–1.030)
pH: 5 (ref 5.0–8.0)

## 2022-09-12 LAB — CBC
HCT: 36.3 % (ref 36.0–46.0)
Hemoglobin: 12.4 g/dL (ref 12.0–15.0)
MCH: 30.5 pg (ref 26.0–34.0)
MCHC: 34.2 g/dL (ref 30.0–36.0)
MCV: 89.4 fL (ref 80.0–100.0)
Platelets: 499 10*3/uL — ABNORMAL HIGH (ref 150–400)
RBC: 4.06 MIL/uL (ref 3.87–5.11)
RDW: 13.4 % (ref 11.5–15.5)
WBC: 15 10*3/uL — ABNORMAL HIGH (ref 4.0–10.5)
nRBC: 0 % (ref 0.0–0.2)

## 2022-09-12 LAB — POCT PREGNANCY, URINE: Preg Test, Ur: POSITIVE — AB

## 2022-09-12 LAB — WET PREP, GENITAL
Clue Cells Wet Prep HPF POC: NONE SEEN
Trich, Wet Prep: NONE SEEN
WBC, Wet Prep HPF POC: <10 (ref <10)
Yeast Wet Prep HPF POC: NONE SEEN

## 2022-09-12 LAB — HCG, QUANTITATIVE, PREGNANCY: hCG, Beta Chain, Quant, S: 17059 m[IU]/mL — ABNORMAL HIGH (ref <5)

## 2022-09-12 LAB — ABO/RH: ABO/RH(D): O POS

## 2022-09-12 MED ORDER — ONDANSETRON 4 MG PO TBDP
4.0000 mg | ORAL_TABLET | Freq: Once | ORAL | Status: AC
  Administered 2022-09-12: 4 mg via ORAL
  Filled 2022-09-12: qty 1

## 2022-09-12 MED ORDER — ACETAMINOPHEN 500 MG PO TABS
1000.0000 mg | ORAL_TABLET | Freq: Once | ORAL | Status: AC
  Administered 2022-09-12: 1000 mg via ORAL
  Filled 2022-09-12: qty 2

## 2022-09-12 NOTE — MAU Note (Signed)
Pt has not returned yet to MAU for test results

## 2022-09-12 NOTE — MAU Note (Signed)
Pt returned from u/s and told sonographer she was going to step outside the unit for a few mins but would return.

## 2022-09-12 NOTE — MAU Note (Signed)
.  Sandra Matthews is a 26 y.o. at Unknown here in MAU reporting lower abd pain for 2 days. Went to the [employer] and saw blood on the tissue.  LMP: 08/01/22 Onset of complaint: 2day Pain score: 10 Vitals:   09/12/22 0020 09/12/22 0025  BP:  113/67  Pulse: 85   Resp: 17   Temp: 98.6 F (37 C)   SpO2: 100%      FHT:n/a Lab orders placed from triage:  u/a

## 2022-09-12 NOTE — MAU Note (Signed)
Pt not in lobby. Security stated pt and her support person left the building down the walkway to parking deck earlier

## 2022-09-12 NOTE — MAU Note (Signed)
Pt not in lobby.  

## 2022-09-12 NOTE — MAU Note (Signed)
Pt and support person returned to unit. Sam Reita Cliche CNM in [facility] RM to discuss test results and d/c plan

## 2022-09-12 NOTE — MAU Provider Note (Signed)
History     CSN: [id number]  Arrival date and time: 09/11/22 2344   Event Date/Time   First Provider Initiated Contact with Patient 09/12/22 0043      Chief Complaint  Patient presents with   Abdominal Pain   Vaginal Bleeding   HPI Sandra Matthews is a 26 y.o. [id number] at [redacted]w[redacted]d who presents to MAU with chief complaints of abdominal pain and vaginal bleeding. These are new problems. Her abdominal pain started two days ago and is 10/10 on arrival to MAU. She requests Tylenol and Zofran on CNM initial assessment. She denies aggravating or alleviating factors.   Patient's vaginal bleeding is new onset today 09/11/2022. She endorses seeing smears of bright red blood when she wipes after voiding. She is not experiencing frank red vaginal bleeding. She is not experiencing weakness, dizziness, or syncope. She endorses recent intercourse.   OB History     Gravida  5   Para  4   Term  3   Preterm  1   AB      Living  4      SAB      IAB      Ectopic      Multiple      Live Births  4           No past medical history on file.  No family history on file.    Allergies: Not on File  No medications prior to admission.    Review of Systems  Gastrointestinal:  Positive for abdominal pain.  Genitourinary:  Positive for vaginal bleeding.  All other systems reviewed and are negative.  Physical Exam   Blood pressure 113/67, pulse 85, temperature 98.6 F (37 C), resp. rate 17, height 5' 1.5" (1.562 m), weight 58.5 kg, last menstrual period 08/01/2022, SpO2 100 %.  Physical Exam Vitals and nursing note reviewed. Exam conducted with a chaperone present.  Constitutional:      Appearance: She is well-developed. She is not ill-appearing.  Cardiovascular:     Rate and Rhythm: Normal rate.  Pulmonary:     Effort: Pulmonary effort is normal.  Abdominal:     General: Abdomen is flat.  Neurological:     Mental Status: She is alert.     MAU Course   Procedures  MDM  --Patient informed sonographer after ultrasound that she was stepping outside --0210: Patient remains off unit. Denice Paradise, MAU Charge RN unable to find patient in MAU waiting area --0220: CNM in MAU waiting area and building lobby/ [facility] security says patient and partner went out back door about 15 min prior --0226: Laurance Flatten RN unable to find patient in MAU waiting area --02287: Patient's home number called. Voicemail left  Patient Vitals for the past 24 hrs:  BP Temp Temp src Pulse Resp SpO2 Height Weight  09/12/22 0240 122/77 99.4 F (37.4 C) Oral (!) 120 18 100 % -- --  09/12/22 0025 113/67 -- -- -- -- -- -- --  09/12/22 0020 -- 98.6 F (37 C) -- 85 17 100 % 5' 1.5" (1.562 m) 58.5 kg   Orders Placed This Encounter  Procedures   Wet prep, genital   US OB LESS THAN 14 WEEKS WITH OB TRANSVAGINAL   US OB Transvaginal   Urinalysis, Routine w reflex microscopic Urine, Clean Catch   CBC   hCG, quantitative, pregnancy   Diet NPO time specified   Nursing communication   Pregnancy, urine POC   ABO/Rh   Discharge patient  Results for orders placed or performed during the hospital encounter of 09/11/22 (from the past 24 hour(s))  Pregnancy, urine POC     Status: Abnormal   Collection Time: 09/12/22 12:03 AM  Result Value Ref Range   Preg Test, Ur POSITIVE (A) NEGATIVE  Urinalysis, Routine w reflex microscopic Urine, Clean Catch     Status: Abnormal   Collection Time: 09/12/22 12:41 AM  Result Value Ref Range   Color, Urine YELLOW YELLOW   APPearance CLOUDY (A) CLEAR   Specific Gravity, Urine 1.028 1.005 - 1.030   pH 5.0 5.0 - 8.0   Glucose, UA NEGATIVE NEGATIVE mg/dL   Hgb urine dipstick MODERATE (A) NEGATIVE   Bilirubin Urine NEGATIVE NEGATIVE   Ketones, ur 5 (A) NEGATIVE mg/dL   Protein, ur 30 (A) NEGATIVE mg/dL   Nitrite NEGATIVE NEGATIVE   Leukocytes,Ua TRACE (A) NEGATIVE   RBC / HPF 11-20 0 - 5 RBC/hpf   WBC, UA 11-20 0 - 5 WBC/hpf   Bacteria, UA MANY (A)  NONE SEEN   Squamous Epithelial / LPF 21-50 0 - 5   Mucus PRESENT    Sperm, UA PRESENT   Wet prep, genital     Status: None   Collection Time: 09/12/22 12:45 AM  Result Value Ref Range   Yeast Wet Prep HPF POC NONE SEEN NONE SEEN   Trich, Wet Prep NONE SEEN NONE SEEN   Clue Cells Wet Prep HPF POC NONE SEEN NONE SEEN   WBC, Wet Prep HPF POC <10 <10   Sperm PRESENT   CBC     Status: Abnormal   Collection Time: 09/12/22  1:17 AM  Result Value Ref Range   WBC 15.0 (H) 4.0 - 10.5 K/uL   RBC 4.06 3.87 - 5.11 MIL/uL   Hemoglobin 12.4 12.0 - 15.0 g/dL   HCT 96.7 89.3 - 81.0 %   MCV 89.4 80.0 - 100.0 fL   MCH 30.5 26.0 - 34.0 pg   MCHC 34.2 30.0 - 36.0 g/dL   RDW 17.5 10.2 - 58.5 %   Platelets 499 (H) 150 - 400 K/uL   nRBC 0.0 0.0 - 0.2 %  hCG, quantitative, pregnancy     Status: Abnormal   Collection Time: 09/12/22  1:17 AM  Result Value Ref Range   hCG, Beta Chain, Quant, S 17,059 (H) <5 mIU/mL  ABO/Rh     Status: None   Collection Time: 09/12/22  1:17 AM  Result Value Ref Range   ABO/RH(D) O POS    No rh immune globuloin      NOT A RH IMMUNE GLOBULIN CANDIDATE, PT RH POSITIVE Performed at [facility], [address], [place], [place] [address]    US OB LESS THAN 14 WEEKS WITH [place] TRANSVAGINAL  Result Date: 09/12/2022 CLINICAL DATA:  First trimester bleeding. EXAM: OBSTETRIC <14 WK [place] AND TRANSVAGINAL OB US TECHNIQUE: Both transabdominal and transvaginal ultrasound examinations were performed for complete evaluation of the gestation as well as the maternal uterus, adnexal regions, and pelvic cul-de-sac. Transvaginal technique was performed to assess early pregnancy. COMPARISON:  None Available. FINDINGS: Intrauterine gestational sac: Single Yolk sac:  Visualized. Embryo:  Not Visualized. MSD: 10.4 mm   5 w   5 d Subchorionic hemorrhage:  None visualized. Maternal uterus/adnexae: Bilateral ovaries appear within normal limits. No free fluid. IMPRESSION: Probable early  intrauterine gestational sac containing yolk sac, but no fetal pole, or cardiac activity yet visualized. Recommend follow-up quantitative B-HCG levels and follow-up US  in 14 days to assess viability. This recommendation follows SRU consensus guidelines: Diagnostic Criteria for Nonviable Pregnancy Early in the First Trimester. [place] Med [id number]. Electronically Signed   By: Ronney Asters M.D.   On: 09/12/2022 01:57    Assessment and Plan  --26 y.o. [id number] with confirmed IUP (+ GS) --Rh POS --Hgb 12.4 --Discharge home in stable condition with first trimester precautions  F/U: --Order placed for repeat viability ultrasound at [facility] in 10-14 days  Darlina Rumpf, [place], MSN, CNM 09/12/2022, 3:38 AM

## 2022-09-13 LAB — GC/CHLAMYDIA PROBE AMP (~~LOC~~) NOT AT ARMC
Chlamydia: NEGATIVE
Comment: NEGATIVE
Comment: NORMAL
Neisseria Gonorrhea: NEGATIVE

## 2022-09-20 DIAGNOSIS — Z419 Encounter for procedure for purposes other than remedying health state, unspecified: Secondary | ICD-10-CM | POA: Diagnosis not present

## 2022-09-27 ENCOUNTER — Encounter (HOSPITAL_COMMUNITY): Payer: Self-pay | Admitting: *Deleted

## 2022-09-27 ENCOUNTER — Inpatient Hospital Stay (HOSPITAL_COMMUNITY)
Admission: AD | Admit: 2022-09-27 | Discharge: 2022-09-27 | Discharge status: Against Medical Advice | Payer: Medicaid Other | Attending: Obstetrics & Gynecology | Admitting: Obstetrics & Gynecology

## 2022-09-27 DIAGNOSIS — Z5321 Procedure and treatment not carried out due to patient leaving prior to being seen by health care provider: Secondary | ICD-10-CM | POA: Insufficient documentation

## 2022-09-27 DIAGNOSIS — F32A Depression, unspecified: Secondary | ICD-10-CM

## 2022-09-27 HISTORY — DX: Depression, unspecified: F32.A

## 2022-09-27 LAB — URINALYSIS, ROUTINE W REFLEX MICROSCOPIC
Bilirubin Urine: NEGATIVE
Glucose, UA: NEGATIVE mg/dL
Hgb urine dipstick: NEGATIVE
Ketones, ur: NEGATIVE mg/dL
Leukocytes,Ua: NEGATIVE
Nitrite: NEGATIVE
Protein, ur: NEGATIVE mg/dL
Specific Gravity, Urine: 1.019 (ref 1.005–1.030)
pH: 6 (ref 5.0–8.0)

## 2022-09-27 NOTE — MAU Note (Signed)
Sandra Matthews is a 26 y.o. at Unknown here in MAU reporting: been having abd pain and bleeding.  Thinks her baby might be dead because she has been having a lot of pain.  Just wanted to make sure everything is ok.  Not soaking pads, no clots. Onset of complaint: started yesterday Pain score: "12" worse than labor, pain started this bad Vitals:   09/27/22 1121  BP: 121/62  Pulse: 92  Resp: 16  Temp: 99.8 F (37.7 C)  SpO2: 100%      Lab orders placed from triage:  urine

## 2022-09-27 NOTE — Progress Notes (Signed)
Patient left MAU before being seen by provider. Provider called for patient x 1 and subsequently called 3 additional times with no answer.    Cherre Robins MSN, CNM Advanced Practice Provider, [facility]

## 2022-09-28 ENCOUNTER — Telehealth: Payer: Medicaid Other | Admitting: Nurse Practitioner

## 2022-09-28 DIAGNOSIS — R110 Nausea: Secondary | ICD-10-CM | POA: Diagnosis not present

## 2022-09-28 NOTE — Patient Instructions (Signed)
Kathaleen Bury, thank you for joining Gildardo Pounds, NP for today's virtual visit.  While this provider is not your primary care provider (PCP), if your PCP is located in our provider database this encounter information will be shared with them immediately following your visit.   [place] account gives you access to today's visit and all your visits, tests, and labs performed at [facility] " click here if you don't have a [facility] account or go to [email or web address]  Consent: (Patient) Kathaleen Bury provided verbal consent for this virtual visit at the beginning of the encounter.  Current Medications:  Current Outpatient Medications:    acetaminophen (TYLENOL) 500 MG tablet, Take 1,000 mg by mouth every 6 (six) hours as needed. (Patient not taking: Reported on 08/29/2020), Disp: , Rfl:    Blood Pressure Monitoring (BLOOD PRESSURE KIT) DEVI, 1 kit by Does not apply route once a week. Check Blood Pressure regularly and record readings into the Babyscripts App.  Large Cuff.  DX O90.0 (Patient not taking: Reported on 08/29/2020), Disp: 1 each, Rfl: 0   ferrous sulfate 325 (65 FE) MG EC tablet, Take 1 tablet (325 mg total) by mouth daily with breakfast. (Patient not taking: Reported on 06/29/2020), Disp: 45 tablet, Rfl: 3   ibuprofen (ADVIL) 600 MG tablet, Take 1 tablet (600 mg total) by mouth every 6 (six) hours as needed for moderate pain or cramping. (Patient not taking: Reported on 08/29/2020), Disp: 30 tablet, Rfl: 0   metroNIDAZOLE (FLAGYL) 500 MG tablet, Take 1 tablet (500 mg total) by mouth 2 (two) times daily., Disp: 14 tablet, Rfl: 0   ondansetron (ZOFRAN ODT) 4 MG disintegrating tablet, Take 1 tablet (4 mg total) by mouth every 6 (six) hours as needed for nausea., Disp: 20 tablet, Rfl: 3   phenylephrine-shark liver oil-mineral oil-petrolatum (PREPARATION H) 0.25-14-74.9 % rectal ointment, Place 1 application rectally 2 (two) times daily  as needed for hemorrhoids., Disp: 28 g, Rfl: 1   Prenatal Vit-Fe Fum-Fe Bisg-FA (NATACHEW) 28-1 MG CHEW, Chew 1 tablet by mouth daily., Disp: 30 tablet, Rfl: 12   sertraline (ZOLOFT) 50 MG tablet, Take 1 tablet (50 mg total) by mouth daily. Take 1/2 tablet qd x 7 days and then 1 whole tablet qd, Disp: 30 tablet, Rfl: 5   Medications ordered in this encounter:  No orders of the defined types were placed in this encounter.    *If you need refills on other medications prior to your next appointment, please contact your pharmacy*  Follow-Up: Call back or seek an in-person evaluation if the symptoms worsen or if the condition fails to improve as anticipated.  [place] [phone]  Other Instructions RETURN TO ED FOR ABDOMINAL PAIN OR AUB Can try B6 /unisom combo for nausea only   If you have been instructed to have an in-person evaluation today at a local Urgent Care facility, please use the link below. It will take you to a list of all of our available [facility], including address, phone number and hours of operation. Please do not delay care.  [facility]  If you or a family member do not have a primary care provider, use the link below to schedule a visit and establish care. When you choose a [facility] primary care physician or advanced practice provider, you gain a long-term partner in health. Find a Primary Care Provider  Learn more about [facility]'s in-office and virtual  care options: [facility]

## 2022-09-28 NOTE — Progress Notes (Signed)
Virtual Visit Consent   [name], you are scheduled for a virtual visit with a [facility] provider today. Just as with appointments in the office, your consent must be obtained to participate. Your consent will be active for this visit and any virtual visit you may have with one of our providers in the next 365 days. If you have a MyChart account, a copy of this consent can be sent to you electronically.  As this is a virtual visit, video technology does not allow for your provider to perform a traditional examination. This may limit your provider's ability to fully assess your condition. If your provider identifies any concerns that need to be evaluated in person or the need to arrange testing (such as labs, EKG, etc.), we will make arrangements to do so. Although advances in technology are sophisticated, we cannot ensure that it will always work on either your end or our end. If the connection with a video visit is poor, the visit may have to be switched to a telephone visit. With either a video or telephone visit, we are not always able to ensure that we have a secure connection.  By engaging in this virtual visit, you consent to the provision of healthcare and authorize for your insurance to be billed (if applicable) for the services provided during this visit. Depending on your insurance coverage, you may receive a charge related to this service.  I need to obtain your verbal consent now. Are you willing to proceed with your visit today? Kathaleen Bury has provided verbal consent on 09/28/2022 for a virtual visit (video or telephone). Gildardo Pounds, NP  Date: 09/28/2022 12:43 PM  Virtual Visit via Video Note   I, Gildardo Pounds, connected with  CHANDRIKA SANDLES  ([id number], 11/17/1995) on 09/28/22 at 12:45 PM EST by a video-enabled telemedicine application and verified that I am speaking with the correct person using two identifiers.  Location: Patient: Virtual Visit  Location Patient: Mobile Provider: Virtual Visit Location Provider: Home Office   I discussed the limitations of evaluation and management by telemedicine and the availability of in person appointments. The patient expressed understanding and agreed to proceed.    History of Present Illness: Sandra Matthews is a 26 y.o. who identifies as a female who was assigned female at birth, and is being seen today for Nausea and vomiting.   Ms. Wrigley is [redacted] weeks pregnant. Has first OB GYN app next Thursday. Experiencing repeated episodes of nausea and feeling sick. LEFT AMA yesterday for same symptoms.  According to ED notes Kathaleen Bury reporting: been having abd pain and bleeding.  Thinks her baby might be dead because she has been having a lot of pain.  Just wanted to make sure everything is ok.  Not soaking pads, no clots. Onset of complaint: started yesterday Pain score: "12" worse than labor, pain started this bad Today I have recommended B12 and unisom for nausea only. She is instructed to return to ED for abnormal bleeding and abdominal pain.   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 (Patient not taking: Reported on 08/29/2020), Disp: , Rfl:    Blood Pressure Monitoring (BLOOD PRESSURE  KIT) DEVI, 1 kit by Does not apply route once a week. Check Blood Pressure regularly and record readings into the Babyscripts App.  Large Cuff.  DX O90.0 (Patient not taking: Reported on 08/29/2020), Disp: 1 each, Rfl: 0   ferrous sulfate 325 (65 FE) MG EC tablet, Take 1 tablet (325 mg total) by mouth daily with breakfast. (Patient not taking: Reported on 06/29/2020), Disp: 45 tablet, Rfl: 3   ibuprofen  (ADVIL) 600 MG tablet, Take 1 tablet (600 mg total) by mouth every 6 (six) hours as needed for moderate pain or cramping. (Patient not taking: Reported on 08/29/2020), Disp: 30 tablet, Rfl: 0   metroNIDAZOLE (FLAGYL) 500 MG tablet, Take 1 tablet (500 mg total) by mouth 2 (two) times daily., Disp: 14 tablet, Rfl: 0   ondansetron (ZOFRAN ODT) 4 MG disintegrating tablet, Take 1 tablet (4 mg total) by mouth every 6 (six) hours as needed for nausea., Disp: 20 tablet, Rfl: 3   phenylephrine-shark liver oil-mineral oil-petrolatum (PREPARATION H) 0.25-14-74.9 % rectal ointment, Place 1 application rectally 2 (two) times daily as needed for hemorrhoids., Disp: 28 g, Rfl: 1   Prenatal Vit-Fe Fum-Fe Bisg-FA (NATACHEW) 28-1 MG CHEW, Chew 1 tablet by mouth daily., Disp: 30 tablet, Rfl: 12   sertraline (ZOLOFT) 50 MG tablet, Take 1 tablet (50 mg total) by mouth daily. Take 1/2 tablet qd x 7 days and then 1 whole tablet qd, Disp: 30 tablet, Rfl: 5  Observations/Objective: Patient is well-developed, well-nourished in no acute distress.  Resting comfortably in car Head is normocephalic, atraumatic.  No labored breathing.  Speech is clear and coherent with logical content.  Patient is alert and oriented at baseline.    Assessment and Plan: 1. Nausea Can try B6/unisom for nausea Return to ED for abdominal pain or aub  Follow Up Instructions: I discussed the assessment and treatment plan with the patient. The patient was provided an opportunity to ask questions and all were answered. The patient agreed with the plan and demonstrated an understanding of the instructions.  A copy of instructions were sent to the patient via MyChart unless otherwise noted below.    The patient was advised to call back or seek an in-person evaluation if the symptoms worsen or if the condition fails to improve as anticipated.  Time:  I spent 11 minutes with the patient via telehealth technology discussing the above  problems/concerns.    Gildardo Pounds, NP

## 2022-10-09 ENCOUNTER — Ambulatory Visit: Payer: Medicaid Other | Admitting: Student

## 2022-10-21 DIAGNOSIS — Z419 Encounter for procedure for purposes other than remedying health state, unspecified: Secondary | ICD-10-CM | POA: Diagnosis not present

## 2022-10-21 NOTE — L&D Delivery Note (Signed)
Delivery Note Sandra Matthews is a 27 y.o. [id number] at [redacted]w[redacted]d admitted for IOL for FGR 6%.   GBS Status:  Positive/-- (06/13 1427)  Labor course: Initial SVE: 2/50/-2. Augmentation with: AROM and Pitocin. She then progressed to complete.  ROM: 5h 90m with clear fluid  Birth: After a 1 push 2nd stage, she delivered a Live born female  Birth Weight: 5 lb 1.8 oz (2320 g) APGAR: 9, 9  Newborn Delivery   Birth date/time: 04/17/2024 22:22:00 Delivery type: Vaginal, Spontaneous         Delivered via spontaneous vaginal delivery (Presentation: LAO ). Nuchal cord present: Yes. Loose, delivered through. Shoulders and body delivered in usual fashion. Infant placed directly on mom's abdomen for bonding/skin-to-skin, baby dried and stimulated. Cord clamped x 2 after 1 minute and cut by grandmother of baby.  Cord blood collected. Placenta delivered-Spontaneous;Pathology  with 3 vessels . 20u Pitocin in 500cc LR given as a bolus prior to delivery of placenta.  Fundus firm with massage. Placenta inspected and appears to be intact with a 3 VC.  Sponge and instrument count were correct x2.  Intrapartum complications:  None Anesthesia:  none Lacerations:  none Suture Repair: n/a EBL (mL):28.00     Mom to postpartum.  Baby to Couplet care / Skin to Skin. Placenta to Pathology for FGR    Plans to Breastfeed Contraception:  plans pp nexplanon Circumcision: wants inpatient  Note sent to [facility]: [facility] for pp visit.  Delivery Report:   Review the Delivery Report for details.     Signed: Jacklyn Shell, DNP,CNM 04/17/2023, 11:01 PM

## 2022-10-23 ENCOUNTER — Other Ambulatory Visit: Payer: Self-pay

## 2022-10-23 ENCOUNTER — Inpatient Hospital Stay (HOSPITAL_COMMUNITY)
Admission: AD | Admit: 2022-10-23 | Discharge: 2022-10-23 | Disposition: A | Discharge status: Home / Self Care | Payer: Medicaid Other | Attending: Obstetrics and Gynecology | Admitting: Obstetrics and Gynecology

## 2022-10-23 ENCOUNTER — Encounter (HOSPITAL_COMMUNITY): Payer: Self-pay | Admitting: Obstetrics and Gynecology

## 2022-10-23 DIAGNOSIS — O99281 Endocrine, nutritional and metabolic diseases complicating pregnancy, first trimester: Secondary | ICD-10-CM | POA: Diagnosis not present

## 2022-10-23 DIAGNOSIS — E876 Hypokalemia: Secondary | ICD-10-CM | POA: Insufficient documentation

## 2022-10-23 DIAGNOSIS — Z79899 Other long term (current) drug therapy: Secondary | ICD-10-CM | POA: Diagnosis not present

## 2022-10-23 DIAGNOSIS — F1190 Opioid use, unspecified, uncomplicated: Secondary | ICD-10-CM | POA: Diagnosis not present

## 2022-10-23 DIAGNOSIS — Z79891 Long term (current) use of opiate analgesic: Secondary | ICD-10-CM | POA: Insufficient documentation

## 2022-10-23 DIAGNOSIS — R6889 Other general symptoms and signs: Secondary | ICD-10-CM

## 2022-10-23 DIAGNOSIS — Z3A11 11 weeks gestation of pregnancy: Secondary | ICD-10-CM | POA: Diagnosis not present

## 2022-10-23 DIAGNOSIS — E860 Dehydration: Secondary | ICD-10-CM | POA: Insufficient documentation

## 2022-10-23 DIAGNOSIS — O99321 Drug use complicating pregnancy, first trimester: Secondary | ICD-10-CM | POA: Diagnosis not present

## 2022-10-23 DIAGNOSIS — F19239 Other psychoactive substance dependence with withdrawal, unspecified: Secondary | ICD-10-CM | POA: Diagnosis not present

## 2022-10-23 LAB — COMPREHENSIVE METABOLIC PANEL
ALT: 26 U/L (ref 0–44)
AST: 19 U/L (ref 15–41)
Albumin: 3.7 g/dL (ref 3.5–5.0)
Alkaline Phosphatase: 45 U/L (ref 38–126)
Anion gap: 12 (ref 5–15)
BUN: 5 mg/dL — ABNORMAL LOW (ref 6–20)
CO2: 20 mmol/L — ABNORMAL LOW (ref 22–32)
Calcium: 9 mg/dL (ref 8.9–10.3)
Chloride: 100 mmol/L (ref 98–111)
Creatinine, Ser: 0.5 mg/dL (ref 0.44–1.00)
GFR, Estimated: >60 mL/min (ref >60)
Glucose, Bld: 79 mg/dL (ref 70–99)
Potassium: 3 mmol/L — ABNORMAL LOW (ref 3.5–5.1)
Sodium: 132 mmol/L — ABNORMAL LOW (ref 135–145)
Total Bilirubin: 0.5 mg/dL (ref 0.3–1.2)
Total Protein: 7 g/dL (ref 6.5–8.1)

## 2022-10-23 LAB — URINALYSIS, ROUTINE W REFLEX MICROSCOPIC
Bacteria, UA: NONE SEEN
Bilirubin Urine: NEGATIVE
Glucose, UA: NEGATIVE mg/dL
Hgb urine dipstick: NEGATIVE
Ketones, ur: 80 mg/dL — AB
Leukocytes,Ua: NEGATIVE
Nitrite: NEGATIVE
Protein, ur: 30 mg/dL — AB
Specific Gravity, Urine: 1.026 (ref 1.005–1.030)
pH: 5 (ref 5.0–8.0)

## 2022-10-23 LAB — CBC
HCT: 35.7 % — ABNORMAL LOW (ref 36.0–46.0)
Hemoglobin: 12.2 g/dL (ref 12.0–15.0)
MCH: 29.9 pg (ref 26.0–34.0)
MCHC: 34.2 g/dL (ref 30.0–36.0)
MCV: 87.5 fL (ref 80.0–100.0)
Platelets: 497 10*3/uL — ABNORMAL HIGH (ref 150–400)
RBC: 4.08 MIL/uL (ref 3.87–5.11)
RDW: 13 % (ref 11.5–15.5)
WBC: 10.8 10*3/uL — ABNORMAL HIGH (ref 4.0–10.5)
nRBC: 0 % (ref 0.0–0.2)

## 2022-10-23 MED ORDER — LOPERAMIDE HCL 2 MG PO CAPS: 2.0000 mg | ORAL_CAPSULE | ORAL | 0 refills | Status: DC | PRN

## 2022-10-23 MED ORDER — BUPRENORPHINE HCL-NALOXONE HCL 8-2 MG SL FILM: 1.0000 | ORAL_FILM | Freq: Two times a day (BID) | SUBLINGUAL | 0 refills | Status: DC

## 2022-10-23 MED ORDER — LACTATED RINGERS IV BOLUS
1000.0000 mL | Freq: Once | INTRAVENOUS | Status: AC
  Administered 2022-10-23: 1000 mL via INTRAVENOUS

## 2022-10-23 MED ORDER — BUPRENORPHINE HCL-NALOXONE HCL 8-2 MG SL SUBL
1.0000 | SUBLINGUAL_TABLET | Freq: Once | SUBLINGUAL | Status: AC
  Administered 2022-10-23: 1 via SUBLINGUAL
  Filled 2022-10-23 (×2): qty 1

## 2022-10-23 MED ORDER — ONDANSETRON 4 MG PO TBDP: 4.0000 mg | ORAL_TABLET | Freq: Four times a day (QID) | ORAL | 0 refills | Status: DC | PRN

## 2022-10-23 MED ORDER — ONDANSETRON HCL 4 MG/2ML IJ SOLN
4.0000 mg | Freq: Once | INTRAMUSCULAR | Status: AC
  Administered 2022-10-23: 4 mg via INTRAVENOUS
  Filled 2022-10-23: qty 2

## 2022-10-23 MED ORDER — POTASSIUM CHLORIDE CRYS ER 20 MEQ PO TBCR: 40.0000 meq | EXTENDED_RELEASE_TABLET | Freq: Two times a day (BID) | ORAL | 0 refills | Status: DC

## 2022-10-23 MED ORDER — LOPERAMIDE HCL 2 MG PO CAPS
2.0000 mg | ORAL_CAPSULE | ORAL | Status: DC | PRN
  Administered 2022-10-23: 2 mg via ORAL
  Filled 2022-10-23: qty 1

## 2022-10-23 MED ORDER — NALOXONE HCL 4 MG/0.1ML NA LIQD: 1.0000 | Freq: Once | NASAL | 0 refills | Status: DC | PRN

## 2022-10-23 NOTE — MAU Provider Note (Signed)
History     CSN: [id number]  Arrival date and time: 10/23/22 1743   Event Date/Time   First Provider Initiated Contact with Patient 10/23/22 1820      Chief Complaint  Patient presents with   Emesis   27 y.o. [id number] _0 .6 wks presenting with N/V/D, abdominal pain, and help with withdrawal sx. Reports diarrhea that started 3 days ago. N/V started 2 days ago. Endorses abdominal pain and body aches as well. Reports using Oxycodone since 2017. Reports using 10 mg TID about every 2-3 days when she has withdrawal sx. Admits to using MJ today and Xanax last week. Denies fevers or sick contacts. She would like help getting off the Oxycodone. She is planning to go to [employer] tomorrow for help. She is currently living with her mom in [place] and states her kids are with their dad.     OB History     Gravida  5   Para  4   Term  3   Preterm  1   AB  0   Living  4      SAB  0   IAB  0   Ectopic  0   Multiple      Live Births  4           Past Medical History:  Diagnosis Date   Anemia    Depression 09/27/2022   PP stuff, was thinking of calling and talking with someone   Preterm labor     Past Surgical History:  Procedure Laterality Date   FRACTURE SURGERY     broken arm in childhood, cast only, no surgery    Family History  Problem Relation Age of Onset   Healthy Mother    Bipolar disorder Father    Depression Father    Anxiety disorder Father    Arthritis Father    Diabetes Maternal Grandmother    Hypertension Maternal Grandfather    Depression Paternal Grandmother     Social History   Tobacco Use   Smoking status: Every Day    Packs/day: 0.50    Years: 2.00    Total pack years: 1.00    Types: Cigarettes   Smokeless tobacco: Never  Vaping Use   Vaping Use: Former  Substance Use Topics   Alcohol use: No   Drug use: Yes    Types: Marijuana, Other-see comments    Comment: occ takes percocet, when she has pain- she will  get one from her cousin; hasn't smoked MJ in "years", last noted in chart 2021    Allergies: No Known Allergies  Medications Prior to Admission  Medication Sig Dispense Refill Last Dose   acetaminophen (TYLENOL) 500 MG tablet Take 1,000 mg by mouth every 6 (six) hours as needed. (Patient not taking: Reported on 08/29/2020)      Blood Pressure Monitoring (BLOOD PRESSURE KIT) DEVI 1 kit by Does not apply route once a week. Check Blood Pressure regularly and record readings into the Babyscripts App.  Large Cuff.  DX O90.0 (Patient not taking: Reported on 08/29/2020) 1 each 0    ferrous sulfate 325 (65 FE) MG EC tablet Take 1 tablet (325 mg total) by mouth daily with breakfast. (Patient not taking: Reported on 06/29/2020) 45 tablet 3    ibuprofen (ADVIL) 600 MG tablet Take 1 tablet (600 mg total) by mouth every 6 (six) hours as needed for moderate pain or cramping. (Patient not taking: Reported on 08/29/2020) 30 tablet 0  metroNIDAZOLE (FLAGYL) 500 MG tablet Take 1 tablet (500 mg total) by mouth 2 (two) times daily. 14 tablet 0    phenylephrine-shark liver oil-mineral oil-petrolatum (PREPARATION H) 0.25-14-74.9 % rectal ointment Place 1 application rectally 2 (two) times daily as needed for hemorrhoids. 28 g 1    Prenatal Vit-Fe Fum-Fe Bisg-FA (NATACHEW) 28-1 MG CHEW Chew 1 tablet by mouth daily. 30 tablet 12    sertraline (ZOLOFT) 50 MG tablet Take 1 tablet (50 mg total) by mouth daily. Take 1/2 tablet qd x 7 days and then 1 whole tablet qd 30 tablet 5    [DISCONTINUED] ondansetron (ZOFRAN ODT) 4 MG disintegrating tablet Take 1 tablet (4 mg total) by mouth every 6 (six) hours as needed for nausea. 20 tablet 3     Review of Systems  Constitutional:  Positive for chills. Negative for fever.  Gastrointestinal:  Positive for abdominal pain, diarrhea, nausea and vomiting.  Genitourinary:  Negative for vaginal bleeding and vaginal discharge.  Musculoskeletal:  Positive for myalgias.  Neurological:   Positive for weakness.   Physical Exam   Blood pressure 122/71, pulse 97, temperature 99.2 F (37.3 C), resp. rate 20, last menstrual period 08/01/2022, SpO2 98 %, currently breastfeeding.  Physical Exam Vitals and nursing note reviewed.  Constitutional:      General: She is not in acute distress (tearful).    Appearance: Normal appearance.  HENT:     Head: Normocephalic and atraumatic.  Cardiovascular:     Rate and Rhythm: Normal rate.  Pulmonary:     Effort: Pulmonary effort is normal. No respiratory distress.  Musculoskeletal:        General: Normal range of motion.     Cervical back: Normal range of motion.  Skin:    General: Skin is warm and dry.  Neurological:     General: No focal deficit present.     Mental Status: She is alert and oriented to person, place, and time.  Psychiatric:        Mood and Affect: Mood normal.        Behavior: Behavior normal.   FHT: 164  Results for orders placed or performed during the hospital encounter of 10/23/22 (from the past 24 hour(s))  Urinalysis, Routine w reflex microscopic Urine, Clean Catch     Status: Abnormal   Collection Time: 10/23/22  6:08 PM  Result Value Ref Range   Color, Urine YELLOW YELLOW   APPearance HAZY (A) CLEAR   Specific Gravity, Urine 1.026 1.005 - 1.030   pH 5.0 5.0 - 8.0   Glucose, UA NEGATIVE NEGATIVE mg/dL   Hgb urine dipstick NEGATIVE NEGATIVE   Bilirubin Urine NEGATIVE NEGATIVE   Ketones, ur 80 (A) NEGATIVE mg/dL   Protein, ur 30 (A) NEGATIVE mg/dL   Nitrite NEGATIVE NEGATIVE   Leukocytes,Ua NEGATIVE NEGATIVE   RBC / HPF 0-5 0 - 5 RBC/hpf   WBC, UA 0-5 0 - 5 WBC/hpf   Bacteria, UA NONE SEEN NONE SEEN   Squamous Epithelial / HPF 11-20 0 - 5 /HPF   Mucus PRESENT   CBC     Status: Abnormal   Collection Time: 10/23/22  7:25 PM  Result Value Ref Range   WBC 10.8 (H) 4.0 - 10.5 K/uL   RBC 4.08 3.87 - 5.11 MIL/uL   Hemoglobin 12.2 12.0 - 15.0 g/dL   HCT 35.7 (L) 36.0 - 46.0 %   MCV 87.5 80.0 -  100.0 fL   MCH 29.9 26.0 - 34.0 pg  MCHC 34.2 30.0 - 36.0 g/dL   RDW 13.0 11.5 - 15.5 %   Platelets 497 (H) 150 - 400 K/uL   nRBC 0.0 0.0 - 0.2 %  Comprehensive metabolic panel     Status: Abnormal   Collection Time: 10/23/22  7:25 PM  Result Value Ref Range   Sodium 132 (L) 135 - 145 mmol/L   Potassium 3.0 (L) 3.5 - 5.1 mmol/L   Chloride 100 98 - 111 mmol/L   CO2 20 (L) 22 - 32 mmol/L   Glucose, Bld 79 70 - 99 mg/dL   BUN 5 (L) 6 - 20 mg/dL   Creatinine, Ser 0.50 0.44 - 1.00 mg/dL   Calcium 9.0 8.9 - 10.3 mg/dL   Total Protein 7.0 6.5 - 8.1 g/dL   Albumin 3.7 3.5 - 5.0 g/dL   AST 19 15 - 41 U/L   ALT 26 0 - 44 U/L   Alkaline Phosphatase 45 38 - 126 U/L   Total Bilirubin 0.5 0.3 - 1.2 mg/dL   GFR, Estimated >60 >60 mL/min   Anion gap 12 5 - 15   MAU Course  Procedures LR Zofran Imodium Suboxone 8-2  MDM Labs ordered and reviewed. Consult with Dr. Dione Plover. Suboxone ordered. 2108: Feeling better after first dose, no further vomiting, COWS 6. Will Rx bid per Dr. Dione Plover. Stable for discharge home.   Assessment and Plan   1. [redacted] weeks gestation of pregnancy   2. Opioid use disorder   3. Symptom of drug withdrawal   4. Hypokalemia   5. Dehydration    Discharge home Follow up at [facility] in 1-2 weeks- message sent  Rx Suboxone Rx Zofran Rx Imodium Rx Kdur Return precautions  Allergies as of 10/23/2022   No Known Allergies      Medication List     STOP taking these medications    Blood Pressure Kit Devi   ferrous sulfate 325 (65 FE) MG EC tablet   ibuprofen 600 MG tablet Commonly known as: ADVIL   metroNIDAZOLE 500 MG tablet Commonly known as: FLAGYL   Preparation H 0.25-14-74.9 % rectal ointment Generic drug: phenylephrine-shark liver oil-mineral oil-petrolatum   sertraline 50 MG tablet Commonly known as: Zoloft       TAKE these medications    acetaminophen 500 MG tablet Commonly known as: TYLENOL Take 1,000 mg by mouth every 6 (six) hours as  needed.   Buprenorphine HCl-Naloxone HCl 8-2 MG Film Commonly known as: Suboxone Place 1 Film under the tongue 2 (two) times daily.   loperamide 2 MG capsule Commonly known as: IMODIUM Take 1 capsule (2 mg total) by mouth as needed for diarrhea or loose stools.   NataChew 28-1 MG Chew Chew 1 tablet by mouth daily.   ondansetron 4 MG disintegrating tablet Commonly known as: Zofran ODT Take 1 tablet (4 mg total) by mouth every 6 (six) hours as needed for nausea.   potassium chloride SA 20 MEQ tablet Commonly known as: KLOR-CON M Take 2 tablets (40 mEq total) by mouth 2 (two) times daily.        Julianne Handler, CNM 10/23/2022, 9:05 PM

## 2022-10-23 NOTE — MAU Note (Addendum)
.  Sandra Matthews is a 27 y.o. at [redacted]w[redacted]d here in MAU reporting: Pt reports that she has been throwing up for two days. Pt reports stomach pain in the mornings  Pt reports she does know how far along she is. Pt reports she did not make it to her first prenatal appointment and plans to reschedule it. Pt reports she struggles with opiate use. Pt reports she took oxy 2 days ago because she was experiencing withdrawals. Pt is interested in getting help for this while she is pregnant.   Onset of complaint: 2 days Pain score: denies  There were no vitals filed for this visit.    Lab orders placed from triage:   ua

## 2022-10-25 ENCOUNTER — Other Ambulatory Visit: Payer: Self-pay | Admitting: Emergency Medicine

## 2022-10-25 MED ORDER — DOXYLAMINE-PYRIDOXINE 10-10 MG PO TBEC: DELAYED_RELEASE_TABLET | ORAL | 0 refills | Status: DC

## 2022-10-25 NOTE — Progress Notes (Signed)
Rx for Diclegis. 

## 2022-10-28 ENCOUNTER — Other Ambulatory Visit: Payer: Self-pay | Admitting: Emergency Medicine

## 2022-10-28 MED ORDER — PROMETHAZINE HCL 25 MG PO TABS: 25.0000 mg | ORAL_TABLET | Freq: Four times a day (QID) | ORAL | 0 refills | Status: DC | PRN

## 2022-10-28 NOTE — Progress Notes (Signed)
Rx for nausea, per protocol.

## 2022-11-03 ENCOUNTER — Other Ambulatory Visit: Payer: Self-pay | Admitting: Certified Nurse Midwife

## 2022-11-05 ENCOUNTER — Encounter: Payer: Self-pay | Admitting: Family Medicine

## 2022-11-05 ENCOUNTER — Ambulatory Visit (INDEPENDENT_AMBULATORY_CARE_PROVIDER_SITE_OTHER): Payer: Medicaid Other | Admitting: Family Medicine

## 2022-11-05 VITALS — BP 128/85 | HR 24 | Ht 63.0 in | Wt 126.0 lb

## 2022-11-05 DIAGNOSIS — F1190 Opioid use, unspecified, uncomplicated: Secondary | ICD-10-CM | POA: Diagnosis not present

## 2022-11-05 MED ORDER — BUPRENORPHINE HCL-NALOXONE HCL 8-2 MG SL FILM: ORAL_FILM | SUBLINGUAL | 0 refills | Status: DC

## 2022-11-05 NOTE — Assessment & Plan Note (Addendum)
Patient doing well on 16 mg BID but occasional increased withdrawal symptoms, discussed trialing additional 4 mg nightly. Also discussed she may have less ups and downs if she takes 8 mg BID instead of irregular dosing of smaller amounts of each strip. Consents to UDS verbally, obtained today. Has new OB visit next week with Gavin Pound. I will see her back here in one month to see how she is doing.

## 2022-11-05 NOTE — Progress Notes (Signed)
GYNECOLOGY OFFICE VISIT NOTE  History:   Sandra Matthews is a 27 y.o. [phone] here today for MAU follow up after initiation of MAT for OUD.  Seen in MAU on 10/23/2022, started on suboxone  Felt better after single dose of 8 mg of suboxone, discharged with BID script to be able to uptitrate if needed  Today reports she has been taking a total of 16 mg daily and occasionally up to 24 mg daily in small divided doses Has not used any illicits since last being seen Ran out a few days ago because of occasional increased need to treat withdrawal symptom   Health Maintenance Due  Topic Date Due   COVID-19 Vaccine (1) Never done   INFLUENZA VACCINE  05/21/2022    Past Medical History:  Diagnosis Date   Anemia    Depression 09/27/2022   PP stuff, was thinking of calling and talking with someone   Preterm labor     Past Surgical History:  Procedure Laterality Date   FRACTURE SURGERY     broken arm in childhood, cast only, no surgery    The following portions of the patient's history were reviewed and updated as appropriate: allergies, current medications, past family history, past medical history, past social history, past surgical history and problem list.   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  Physical Exam:  BP 128/85   Pulse (!) 24   Ht 5\' 3"  (1.6 m)   Wt 126 lb (57.2 kg)   LMP 08/01/2022   Breastfeeding No   BMI 22.32 kg/m  CONSTITUTIONAL: Well-developed, well-nourished female in no acute distress.  HEENT:  Normocephalic, atraumatic. External right and left ear normal. No scleral icterus.  NECK: Normal range of motion, supple, no masses noted on observation SKIN: No rash  noted. Not diaphoretic. No erythema. No pallor. MUSCULOSKELETAL: Normal range of motion. No edema noted. NEUROLOGIC: Alert and oriented to person, place, and time. Normal muscle tone coordination.  PSYCHIATRIC: Normal mood and affect. Normal behavior. Normal judgment and thought content. RESPIRATORY: Effort normal, no problems with respiration noted   Labs and Imaging No results found for this or any previous visit (from the past 168 hour(s)). No results found.    Assessment and Plan:   Problem List Items Addressed This Visit       Other   Opioid use disorder - Primary    Patient doing well on 16 mg BID but occasional increased withdrawal symptoms, discussed trialing additional 4 mg nightly. Also discussed she may have less ups and downs if she takes 8 mg BID instead of irregular dosing of smaller amounts of each strip. Consents to UDS verbally, obtained today. Has new OB visit next week with Gavin Pound. I will see her back here in one month to see how she is doing.       Relevant Medications   Buprenorphine HCl-Naloxone HCl (SUBOXONE) 8-2 MG FILM   Other Relevant Orders   ToxASSURE Select 13 (MW), Urine    Routine preventative health maintenance measures emphasized. Please refer to After Visit Summary for other counseling recommendations.   Return in about 4 weeks (around 12/03/2022) for suboxone follow up.  Total face-to-face time with patient: 20 minutes.  Over 50% of encounter was spent on counseling and coordination of care.   Clarnce Flock, MD/MPH Attending Family Medicine Physician, [facility] for [address] [facility], [place]

## 2022-11-06 LAB — DRUG SCREEN 10 W/CONF, SERUM
Amphetamines, IA: NEGATIVE ng/mL
Barbiturates, IA: NEGATIVE ug/mL
Benzodiazepines, IA: NEGATIVE ng/mL
Cocaine & Metabolite, IA: NEGATIVE ng/mL
Methadone, IA: NEGATIVE ng/mL
Opiates, IA: NEGATIVE ng/mL
Oxycodones, IA: NEGATIVE ng/mL
Phencyclidine, IA: NEGATIVE ng/mL
Propoxyphene, IA: NEGATIVE ng/mL
THC(Marijuana) Metabolite, IA: POSITIVE ng/mL — AB

## 2022-11-06 LAB — THC,MS,WB/SP RFX
Cannabidiol: NEGATIVE ng/mL
Cannabinoid Confirmation: POSITIVE
Carboxy-THC: 7 ng/mL
Hydroxy-THC: NEGATIVE ng/mL
Tetrahydrocannabinol(THC): 1.7 ng/mL

## 2022-11-07 LAB — TOXASSURE SELECT 13 (MW), URINE

## 2022-11-13 ENCOUNTER — Other Ambulatory Visit (HOSPITAL_COMMUNITY)
Admission: RE | Admit: 2022-11-13 | Discharge: 2022-11-13 | Disposition: A | Discharge status: Home / Self Care | Payer: Medicaid Other | Source: Ambulatory Visit

## 2022-11-13 ENCOUNTER — Ambulatory Visit (INDEPENDENT_AMBULATORY_CARE_PROVIDER_SITE_OTHER): Payer: Medicaid Other

## 2022-11-13 VITALS — BP 114/73 | HR 80 | Wt 128.2 lb

## 2022-11-13 DIAGNOSIS — O09892 Supervision of other high risk pregnancies, second trimester: Secondary | ICD-10-CM | POA: Diagnosis not present

## 2022-11-13 DIAGNOSIS — O09899 Supervision of other high risk pregnancies, unspecified trimester: Secondary | ICD-10-CM

## 2022-11-13 DIAGNOSIS — Z3A14 14 weeks gestation of pregnancy: Secondary | ICD-10-CM | POA: Diagnosis not present

## 2022-11-13 DIAGNOSIS — Z8742 Personal history of other diseases of the female genital tract: Secondary | ICD-10-CM

## 2022-11-13 DIAGNOSIS — Z3482 Encounter for supervision of other normal pregnancy, second trimester: Secondary | ICD-10-CM | POA: Diagnosis not present

## 2022-11-13 DIAGNOSIS — F1190 Opioid use, unspecified, uncomplicated: Secondary | ICD-10-CM

## 2022-11-13 DIAGNOSIS — O99612 Diseases of the digestive system complicating pregnancy, second trimester: Secondary | ICD-10-CM | POA: Diagnosis not present

## 2022-11-13 DIAGNOSIS — F17200 Nicotine dependence, unspecified, uncomplicated: Secondary | ICD-10-CM

## 2022-11-13 DIAGNOSIS — K5900 Constipation, unspecified: Secondary | ICD-10-CM

## 2022-11-13 HISTORY — DX: Supervision of other high risk pregnancies, unspecified trimester: O09.899

## 2022-11-13 MED ORDER — DOCUSATE SODIUM 100 MG PO CAPS: 100.0000 mg | ORAL_CAPSULE | Freq: Two times a day (BID) | ORAL | 2 refills | Status: DC

## 2022-11-13 NOTE — Progress Notes (Signed)
Patient presents for new OB visit. No concerns at this time.  PHQ - 4 GAD - 2

## 2022-11-13 NOTE — Progress Notes (Signed)
Subjective:   Sandra Matthews is a 27 y.o. [id number] at [redacted]w[redacted]d by LMP of 08/01/2022  being seen today for her first obstetrical visit.  Patient states this was an unplanned pregnancy.  Patient reports she was not on birth control prior to conception.   Gynecological/Obstetrical History: Patient reports a history of gynecological surgeries stating she had a colposcopy with biopsy in 2021.  Patient also endorses a history of abnormal pap smears.   Pregnancy history fully reviewed. Patient does intend to breast feed. Patient obstetrical history is significant for  SUD on Suboxone, Preterm Delivery .   Sexual Activity and Vaginal Concerns: Patient is not currently sexually active.  She denies vaginal discharge, bleeding, irritation, or odor. Patient also denies pain or difficulty with urination.    Medical History/ROS: Patient denies medical history significant for cardiovascular, respiratory, gastrointestinal, or hematological disorders. Patient reports a history of depression and states she has taken medication in her previous pregnancy. She states she does not see a counselor or therapist and declines support at this time.   Patient reports nausea and vomiting, but states she is taking Zofran with good results.   Patient denies diarrhea, but reports constipation sitting her last bm 3 days ago was hard to pass. She also reports recurrent headaches, but contributes this to the suboxone dosing.    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 She states she currently smokes about a pack of cigarettes every other day.  Patient reports the FOB is unknown. Patient expresses desire for adoption for this baby. Patient reports that she lives with her mother and youngest 21 and endorses safety at home.  Patient denies DV/A. Patient is not currently employed.  HISTORY: OB History  Gravida Para Term Preterm AB  Living  5 4 3 1  0 4  SAB IAB Ectopic Multiple Live Births  0 0 0 0 4    # Outcome Date GA Lbr Len/2nd Weight Sex Delivery Anes PTL Lv  5 Current           4 Term 07/30/20 [redacted]w[redacted]d 03:55 / 00:04 5 lb 14.7 oz (2.685 kg) M Vag-Spont None  LIV     Name: JIMMY, STIPES     Apgar1: 8  Apgar5: 9  3 Term 03/17/18 [redacted]w[redacted]d 06:15 / 00:02 4 lb 8.3 oz (2.05 kg) M Vag-Spont None  LIV     Name: ALYONA, ROMACK     Apgar1: 9  Apgar5: 9  2 Term 03/24/16 [redacted]w[redacted]d 18:33 / 00:04 6 lb 3.7 oz (2.825 kg) F Vag-Spont None  LIV     Name: Bither,GIRL Meghana     Apgar1: 9  Apgar5: 9  1 Preterm 03/09/11 [redacted]w[redacted]d  4 lb 2 oz (1.871 kg) M Vag-Spont None Y LIV    Last pap smear was done 2021 and was abnormal - 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  Past Medical History:  Diagnosis Date   Anemia    Depression 09/27/2022   PP stuff, was thinking of calling and talking with someone   Preterm labor    Past Surgical History:  Procedure Laterality Date   FRACTURE SURGERY     broken arm in childhood, cast only, no surgery   Family History  Problem Relation Age of Onset   Healthy Mother    Bipolar disorder Father    Depression Father    Anxiety disorder Father  Arthritis Father    Diabetes Maternal Grandmother    Hypertension Maternal Grandfather    Depression Paternal Grandmother    Social History   Tobacco Use   Smoking status: Every Day    Packs/day: 0.50    Years: 2.00    Total pack years: 1.00    Types: Cigarettes   Smokeless tobacco: Never  Vaping Use   Vaping Use: Former  Substance Use Topics   Alcohol use: No   Drug use: Yes    Types: Marijuana, Other-see comments    Comment: occ takes percocet, when she has pain- she will get one from her cousin; hasn't smoked MJ in "years", last noted in chart 2021   No Known Allergies Current Outpatient Medications on File Prior to Visit  Medication Sig Dispense Refill   acetaminophen (TYLENOL) 500 MG tablet Take 1,000 mg by mouth  every 6 (six) hours as needed.     Buprenorphine HCl-Naloxone HCl (SUBOXONE) 8-2 MG FILM Take one film in the morning, one film in the afternoon, and a half film in the evening 75 each 0   Doxylamine-Pyridoxine (DICLEGIS) 10-10 MG TBEC Take 2 tabs qhs then add 1 tab A.M and midday prn (Patient not taking: Reported on 11/05/2022) 90 tablet 0   loperamide (IMODIUM) 2 MG capsule Take 1 capsule (2 mg total) by mouth as needed for diarrhea or loose stools. (Patient not taking: Reported on 11/13/2022) 30 capsule 0   naloxone (NARCAN) nasal spray 4 mg/0.1 mL Place 1 spray into the nose once as needed for up to 1 dose (overdose). (Patient not taking: Reported on 11/05/2022) 1 each 0   ondansetron (ZOFRAN ODT) 4 MG disintegrating tablet Take 1 tablet (4 mg total) by mouth every 6 (six) hours as needed for nausea. (Patient not taking: Reported on 11/05/2022) 30 tablet 0   Prenatal Vit-Fe Fum-Fe Bisg-FA (NATACHEW) 28-1 MG CHEW Chew 1 tablet by mouth daily. (Patient not taking: Reported on 11/05/2022) 30 tablet 12   promethazine (PHENERGAN) 25 MG tablet Take 1 tablet (25 mg total) by mouth every 6 (six) hours as needed for nausea or vomiting. (Patient not taking: Reported on 11/05/2022) 30 tablet 0   No current facility-administered medications on file prior to visit.    Review of Systems Pertinent items noted in HPI and remainder of comprehensive ROS otherwise negative.  Exam   Vitals:   11/13/22 1022  BP: 114/73  Pulse: 80  Weight: 128 lb 3.2 oz (58.2 kg)   Fetal Heart Rate (bpm): 147  Physical Exam Constitutional:      Appearance: Normal appearance.  Genitourinary:     Vulva normal.     Genitourinary Comments: Speculum exam completed Moderate amt frothy white discharge. No apparent odor. CV collected. Cervix pink and without lesions or polyps. Pap collected with brush and spatula. Bimanual exam: Uterus size difficult to assess d/t position. No apparent tenderness.   HENT:     Head: Normocephalic  and atraumatic.  Eyes:     Conjunctiva/sclera: Conjunctivae normal.  Cardiovascular:     Rate and Rhythm: Normal rate and regular rhythm.     Heart sounds: Normal heart sounds.  Pulmonary:     Effort: Pulmonary effort is normal.     Breath sounds: Normal breath sounds.  Chest:     Comments: Breast exam deferred Abdominal:     General: Bowel sounds are normal.  Musculoskeletal:        General: Normal range of motion.     Cervical back:  Normal range of motion.  Neurological:     Mental Status: She is alert and oriented to person, place, and time.  Skin:    General: Skin is warm and dry.  Psychiatric:        Mood and Affect: Mood normal.        Behavior: Behavior normal.        Thought Content: Thought content normal.        Judgment: Judgment normal.  Vitals reviewed. Exam conducted with a chaperone present.     Assessment:   27 y.o. year old [id number] Patient Active Problem List   Diagnosis Date Noted   Supervision of other high risk pregnancy, antepartum 11/13/2022   Opioid use disorder 11/05/2022   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   History of preterm premature rupture of membranes (PPROM) 12/17/2017     Plan:  1. Supervision of other high risk pregnancy, antepartum -Congratulations given and patient welcomed back to practice. *Reviewed prenatal visit schedule and platforms used for virtual visits.  -Anticipatory guidance for prenatal visits including labs, ultrasounds, and testing; Initial labs ordered and drawn. -Genetic Screening discussed, NIPS: ordered. -Encouraged to complete and utilize MyChart Registration for her ability to review results, send requests, and have questions addressed.  -Discussed estimated due date of May 08, 2023. -Ultrasound discussed; fetal anatomic survey: ordered. -Discussed availability of [facility] transfer as patient receiving Suboxone treatment from [facility].  Patient to consider.  -Encouraged to seek out care at office or emergency room for  urgent and/or emergent concerns. -Educated on the nature of [facility] with multiple MDs and other Advanced Practice Providers was explained to patient; also emphasized that residents, students are part of our team. Informed of her right to refuse care as she deems appropriate.  -No questions or concerns.    2. [redacted] weeks gestation of pregnancy -Doing well overall. -Reviewed and support given for desire to adopt.  -Pregnancy navigator notified of patient need for resources.  3. Opioid use disorder -Taking Suboxone.  4. History of abnormal cervical Pap smear -Pap collected today.  5. Constipation during pregnancy in second trimester -Rx for Colace to pharmacy on file.  -Encouraged to increase water and fiber in diet.   6. Current smoker -Patient current every day smoker.  Reports smoking 1 pack every other day. -Desires cessation. -Given information in AVS  7. History of preterm delivery, currently pregnant -First baby born at 33 weeks. -All other babies born full term.    Problem list reviewed and updated. Routine obstetric precautions reviewed.  Orders Placed This Encounter  Procedures   Culture, OB Urine   CBC/D/Plt+RPR+Rh+ABO+RubIgG...    Order Specific Question:   Release to patient    Answer:   Immediate   [facility] PRENATAL TEST FULL PANEL    ==========Department Information========== ID: [id number] Department:[facility] [place], [place] [address] Dept: [phone] Dept Fax: [phone]     Order Specific Question:   Expected due date (MM/DD/YYYY):    Answer:   05/08/2023    Order Specific Question:   Is this a twin pregnancy? (viable, no vanished twin)    Answer:   No    Order Specific Question:   Is this a surrogate or egg donor pregnancy?    Answer:   No    Order Specific Question:  I want fetal sex included in  the report:    Answer:   Yes    Order Specific Question:   Maternal Weight (lbs):    Answer:   66    Order Specific Question:   Which Microdeletion Panel should be ordered?    Answer:   22q11.2 Deletion    Order Specific Question:   What type of billing?    Answer:   Programme researcher, broadcasting/film/video Question:   By placing this electronic order I confirm the testing ordered herein is medically necessary and this patient has been informed of the details of the genetic test(s) ordered, including the risks, benefits, and alternatives, and has consented to testing.    Answer:   Yes    Order Specific Question:   Select an order diagnosis: For additional options refer to [email or web address]    Answer:   Encounter for supervision of other normal pregnancy in second trimester [id number]   HORIZON CUSTOM    ==========Department Information========== ID: [id number] Department:[facility] [place] [facility] [place], [place] [address] Dept: [phone] Dept Fax: [phone]     Order Specific Question:   Specify the name or ID of a valid Horizon Custom Panel:    Answer:   HBASIC    Order Specific Question:   Is patient pregnant?    Answer:   Yes    Order Specific Question:   Patient authorizes Johnsie Cancel to share patient's Horizon test results with partner and their medical provider?    Answer:   No    Order Specific Question:   Practice ensures that HIPAA consent is obtained and will make available to [facility] upon request?    Answer:   Yes    Order Specific Question:   By placing this electronic order I confirm the testing ordered herein is medically necessary and this patient has been informed of the details of the genetic test(s) ordered, including the risks, benefits, and alternatives, and has consented to testing.    Answer:   Yes    Order Specific Question:   What type of  billing?    Answer:   [employer] Specific Question:   Select an order diagnosis: For additional options refer to [email or web address]    Answer:   Encounter for supervision of other normal pregnancy in second trimester [[id number]]    Order Specific Question:   Tay-Sachs add-on test?    Answer:   No    Order Specific Question:   Release to patient    Answer:   Immediate [1]    No follow-ups on file.     [name], CNM 11/13/2022 2:15 PM

## 2022-11-14 LAB — CERVICOVAGINAL ANCILLARY ONLY
Chlamydia: NEGATIVE
Comment: NEGATIVE
Comment: NEGATIVE
Comment: NORMAL
Neisseria Gonorrhea: NEGATIVE
Trichomonas: NEGATIVE

## 2022-11-15 LAB — CULTURE, OB URINE

## 2022-11-15 LAB — URINE CULTURE, OB REFLEX

## 2022-11-16 LAB — CBC/D/PLT+RPR+RH+ABO+RUBIGG...
Basophils Absolute: 0.1 10*3/uL (ref 0.0–0.2)
Basos: 1 %
EOS (ABSOLUTE): 0.1 10*3/uL (ref 0.0–0.4)
Eos: 1 %
HCV Ab: NONREACTIVE
HIV Screen 4th Generation wRfx: NONREACTIVE
Hematocrit: 35.5 % (ref 34.0–46.6)
Hemoglobin: 12.3 g/dL (ref 11.1–15.9)
Hepatitis B Surface Ag: NEGATIVE
Immature Grans (Abs): 0.1 10*3/uL (ref 0.0–0.1)
Immature Granulocytes: 1 %
Lymphocytes Absolute: 2.3 10*3/uL (ref 0.7–3.1)
Lymphs: 26 %
MCH: 30.2 pg (ref 26.6–33.0)
MCHC: 34.6 g/dL (ref 31.5–35.7)
MCV: 87 fL (ref 79–97)
Monocytes Absolute: 0.6 10*3/uL (ref 0.1–0.9)
Monocytes: 7 %
Neutrophils Absolute: 5.7 10*3/uL (ref 1.4–7.0)
Neutrophils: 64 %
Platelets: 438 10*3/uL (ref 150–450)
RBC: 4.07 x10E6/uL (ref 3.77–5.28)
RDW: 13.1 % (ref 11.7–15.4)
RPR Ser Ql: NONREACTIVE
Rh Factor: POSITIVE
Rubella Antibodies, IGG: 2.12 index (ref >0.99)
WBC: 8.8 10*3/uL (ref 3.4–10.8)

## 2022-11-16 LAB — AB SCR+ANTIBODY ID: Antibody Screen: POSITIVE — AB

## 2022-11-16 LAB — HCV INTERPRETATION

## 2022-11-20 LAB — PANORAMA PRENATAL TEST FULL PANEL:PANORAMA TEST PLUS 5 ADDITIONAL MICRODELETIONS: FETAL FRACTION: 8.8

## 2022-11-21 DIAGNOSIS — Z419 Encounter for procedure for purposes other than remedying health state, unspecified: Secondary | ICD-10-CM | POA: Diagnosis not present

## 2022-11-22 DIAGNOSIS — O219 Vomiting of pregnancy, unspecified: Secondary | ICD-10-CM | POA: Diagnosis not present

## 2022-11-22 DIAGNOSIS — Z3A16 16 weeks gestation of pregnancy: Secondary | ICD-10-CM | POA: Diagnosis not present

## 2022-11-22 DIAGNOSIS — R112 Nausea with vomiting, unspecified: Secondary | ICD-10-CM | POA: Diagnosis not present

## 2022-11-22 DIAGNOSIS — Z743 Need for continuous supervision: Secondary | ICD-10-CM | POA: Diagnosis not present

## 2022-11-22 DIAGNOSIS — R110 Nausea: Secondary | ICD-10-CM | POA: Diagnosis not present

## 2022-11-22 DIAGNOSIS — O26892 Other specified pregnancy related conditions, second trimester: Secondary | ICD-10-CM | POA: Diagnosis not present

## 2022-11-22 DIAGNOSIS — R1111 Vomiting without nausea: Secondary | ICD-10-CM | POA: Diagnosis not present

## 2022-11-22 DIAGNOSIS — R1084 Generalized abdominal pain: Secondary | ICD-10-CM | POA: Diagnosis not present

## 2022-11-22 LAB — CYTOLOGY - PAP
Diagnosis: NEGATIVE
Diagnosis: REACTIVE

## 2022-11-22 LAB — HORIZON CUSTOM

## 2022-12-03 ENCOUNTER — Encounter: Payer: Medicaid Other | Admitting: Family Medicine

## 2022-12-11 ENCOUNTER — Other Ambulatory Visit: Payer: Self-pay

## 2022-12-11 ENCOUNTER — Encounter (HOSPITAL_COMMUNITY): Payer: Self-pay | Admitting: Obstetrics and Gynecology

## 2022-12-11 ENCOUNTER — Inpatient Hospital Stay (HOSPITAL_COMMUNITY)
Admission: AD | Admit: 2022-12-11 | Discharge: 2022-12-11 | Disposition: A | Discharge status: Home / Self Care | Payer: Medicaid Other | Attending: Obstetrics and Gynecology | Admitting: Obstetrics and Gynecology

## 2022-12-11 DIAGNOSIS — O99332 Smoking (tobacco) complicating pregnancy, second trimester: Secondary | ICD-10-CM | POA: Diagnosis not present

## 2022-12-11 DIAGNOSIS — F1193 Opioid use, unspecified with withdrawal: Secondary | ICD-10-CM | POA: Diagnosis not present

## 2022-12-11 DIAGNOSIS — F1190 Opioid use, unspecified, uncomplicated: Secondary | ICD-10-CM

## 2022-12-11 DIAGNOSIS — O99322 Drug use complicating pregnancy, second trimester: Secondary | ICD-10-CM | POA: Insufficient documentation

## 2022-12-11 DIAGNOSIS — N898 Other specified noninflammatory disorders of vagina: Secondary | ICD-10-CM | POA: Insufficient documentation

## 2022-12-11 DIAGNOSIS — O26892 Other specified pregnancy related conditions, second trimester: Secondary | ICD-10-CM | POA: Insufficient documentation

## 2022-12-11 DIAGNOSIS — Z3A18 18 weeks gestation of pregnancy: Secondary | ICD-10-CM | POA: Insufficient documentation

## 2022-12-11 DIAGNOSIS — R110 Nausea: Secondary | ICD-10-CM | POA: Diagnosis not present

## 2022-12-11 DIAGNOSIS — R1111 Vomiting without nausea: Secondary | ICD-10-CM | POA: Diagnosis not present

## 2022-12-11 DIAGNOSIS — F1123 Opioid dependence with withdrawal: Secondary | ICD-10-CM | POA: Insufficient documentation

## 2022-12-11 DIAGNOSIS — R197 Diarrhea, unspecified: Secondary | ICD-10-CM | POA: Diagnosis not present

## 2022-12-11 DIAGNOSIS — F17210 Nicotine dependence, cigarettes, uncomplicated: Secondary | ICD-10-CM | POA: Insufficient documentation

## 2022-12-11 DIAGNOSIS — Z743 Need for continuous supervision: Secondary | ICD-10-CM | POA: Diagnosis not present

## 2022-12-11 LAB — URINALYSIS, ROUTINE W REFLEX MICROSCOPIC
Bilirubin Urine: NEGATIVE
Glucose, UA: NEGATIVE mg/dL
Hgb urine dipstick: NEGATIVE
Ketones, ur: 20 mg/dL — AB
Leukocytes,Ua: NEGATIVE
Nitrite: NEGATIVE
Protein, ur: NEGATIVE mg/dL
Specific Gravity, Urine: 1.019 (ref 1.005–1.030)
pH: 5 (ref 5.0–8.0)

## 2022-12-11 LAB — WET PREP, GENITAL
Clue Cells Wet Prep HPF POC: NONE SEEN
Sperm: NONE SEEN
Trich, Wet Prep: NONE SEEN
WBC, Wet Prep HPF POC: <10 (ref <10)
Yeast Wet Prep HPF POC: NONE SEEN

## 2022-12-11 MED ORDER — ONDANSETRON 4 MG PO TBDP: 4.0000 mg | ORAL_TABLET | Freq: Four times a day (QID) | ORAL | 5 refills | Status: DC | PRN

## 2022-12-11 MED ORDER — BUPRENORPHINE HCL-NALOXONE HCL 8-2 MG SL SUBL
1.0000 | SUBLINGUAL_TABLET | Freq: Once | SUBLINGUAL | Status: AC
  Administered 2022-12-11: 1 via SUBLINGUAL
  Filled 2022-12-11: qty 1

## 2022-12-11 MED ORDER — BUPRENORPHINE HCL-NALOXONE HCL 8-2 MG SL FILM: ORAL_FILM | SUBLINGUAL | 0 refills | Status: DC

## 2022-12-11 MED ORDER — ONDANSETRON 4 MG PO TBDP
8.0000 mg | ORAL_TABLET | Freq: Once | ORAL | Status: AC
  Administered 2022-12-11: 8 mg via ORAL
  Filled 2022-12-11: qty 2

## 2022-12-11 MED ORDER — LOPERAMIDE HCL 2 MG PO CAPS: 2.0000 mg | ORAL_CAPSULE | ORAL | 0 refills | Status: DC | PRN

## 2022-12-11 MED ORDER — VITAFOL GUMMIES 3.33-0.333-34.8 MG PO CHEW: 1.0000 | CHEWABLE_TABLET | Freq: Every day | ORAL | 5 refills | Status: DC

## 2022-12-11 MED ORDER — ACETAMINOPHEN 500 MG PO TABS
1000.0000 mg | ORAL_TABLET | Freq: Once | ORAL | Status: AC
  Administered 2022-12-11: 1000 mg via ORAL
  Filled 2022-12-11: qty 2

## 2022-12-11 MED ORDER — LOPERAMIDE HCL 2 MG PO CAPS
4.0000 mg | ORAL_CAPSULE | Freq: Once | ORAL | Status: AC
  Administered 2022-12-11: 4 mg via ORAL
  Filled 2022-12-11: qty 2

## 2022-12-11 NOTE — MAU Note (Signed)
Sandra Matthews is a 27 y.o. at 101w6dhere in MAU reporting: has been having nausea, vomiting, and diarrhea for the past 3 days. 6-7 episodes of emesis in the past 24 hours. 4-5 episodes of watery diarrhea in the past 24 hours. States she ran out suboxone on 2/17, prior to this she was taking it as prescribed. States after her GI symptoms started she took 2 percocet. Endorses marijuana use but no other drugs. States her chest feels heavy since last night. Having abdominal pain and chest pain. No bleeding, LOF, or abnormal discharge.   Onset of complaint: ongoing  Pain score: abdomen 6/10, chest 6/10  Vitals:   12/11/22 1012  BP: 121/75  Pulse: 87  Resp: 16  Temp: 100.2 F (37.9 C)  SpO2: 100%     FHT:154  Lab orders placed from triage: UA

## 2022-12-11 NOTE — MAU Provider Note (Signed)
History     [id number]  Arrival date and time: 12/11/22 [id number]    Chief Complaint  Patient presents with   Abdominal Pain   Chest Pain   Nausea   Diarrhea     HPI Sandra Matthews is a 27 y.o. at 50w6dwith PMHx notable for OUD on suboxone, PPROM at 26 weeks in prior pregnancy, who presents for multiple issues.   Patient first seen by me on 10/23/2022, at that time endorsed active OUD and desired treatment. Started on suboxone while in MAU and did well She followed up with me in clinic on 11/05/2022, at that time was taking 16-24 mg of suboxone daily, written for dose of 20 mg daily in divided dosing, plan for follow up in one month [facility]/v, likely multifactorial but no acute issues, discharged home She recently no showed an app[facility]  Review of PDMP shows she did fill a 30 day rx for suboxone on 11/07/2022 UDS from our visit on 11/05/2022 was appropriate, with only buprenorphine and THC resulting positive  Today reports she missed our appointment because her mother was hospitalized with serious complications of newly diagnosed diabetes She has been suffering through withdrawal symptoms because she thought she needed to be seen in person to get a refill She did take one or two percocets because her symptoms had gotten so bad, that was several days ago She reports she was stable on previously prescribed regimen Currently having a lot of nausea, vomiting, and diarrhea that feel like withdrawal symptoms to her Stomach feels strange but no contractions, vaginal bleeding, or vaginal leaking of fluid Does endorse increased discharge compared to normal   O/Positive/-- (01/24 1204)  OB History     Gravida  5   Para  4   Term  3   Preterm  1   AB  0   Living  4      SAB  0   IAB  0   Ectopic  0   Multiple      Live Births  4           Past Medical History:  Diagnosis Date    Anemia    Depression 09/27/2022   PP stuff, was thinking of calling and talking with someone   Preterm labor     Past Surgical History:  Procedure Laterality Date   FRACTURE SURGERY     broken arm in childhood, cast only, no surgery    Family History  Problem Relation Age of Onset   Diabetes Mother    Healthy Mother    Bipolar disorder Father    Depression Father    Anxiety disorder Father    Arthritis Father    Diabetes Maternal Grandmother    Hypertension Maternal Grandfather    Depression Paternal Grandmother     Social History   Socioeconomic History   Marital status: Single    Spouse name: Not on file   Number of children: 3   Years of education: Not on file   Highest education level: Not on file  Occupational History   Occupation: UNEMPLOYED  Tobacco Use   Smoking status: Every Day    Packs/day: 0.50    Years: 2.00    Total pack years: 1.00    Types: Cigarettes   Smokeless tobacco: Never  Vaping Use   Vaping Use: Former  Substance and Sexual Activity  Alcohol use: No   Drug use: Yes    Types: Marijuana, Other-see comments    Comment: occ takes percocet, when she has pain- she will get one from her cousin; hasn't smoked MJ in "years", last noted in chart 2021   Sexual activity: Yes    Birth control/protection: None  Other Topics Concern   Not on file  Social History Narrative   ** Merged History Encounter **       Social Determinants of Health   Financial Resource Strain: Not on file  Food Insecurity: Not on file  Transportation Needs: Not on file  Physical Activity: Not on file  Stress: Not on file  Social Connections: Not on file  Intimate Partner Violence: Not on file    No Known Allergies  No current facility-administered medications on file prior to encounter.   Current Outpatient Medications on File Prior to Encounter  Medication Sig Dispense Refill   acetaminophen (TYLENOL) 500 MG tablet Take 1,000 mg by mouth every 6 (six) hours  as needed.     docusate sodium (COLACE) 100 MG capsule Take 1 capsule (100 mg total) by mouth 2 (two) times daily. Then decrease to once daily or as needed. 60 capsule 2   Doxylamine-Pyridoxine (DICLEGIS) 10-10 MG TBEC Take 2 tabs qhs then add 1 tab A.M and midday prn (Patient not taking: Reported on 11/05/2022) 90 tablet 0   naloxone (NARCAN) nasal spray 4 mg/0.1 mL Place 1 spray into the nose once as needed for up to 1 dose (overdose). (Patient not taking: Reported on 11/05/2022) 1 each 0   Prenatal Vit-Fe Fum-Fe Bisg-FA (NATACHEW) 28-1 MG CHEW Chew 1 tablet by mouth daily. (Patient not taking: Reported on 11/05/2022) 30 tablet 12   promethazine (PHENERGAN) 25 MG tablet Take 1 tablet (25 mg total) by mouth every 6 (six) hours as needed for nausea or vomiting. (Patient not taking: Reported on 11/05/2022) 30 tablet 0     ROS Pertinent positives and negative per HPI, all others reviewed and negative  Physical Exam   BP 121/75 (BP Location: Left Arm)   Pulse 87   Temp 100.2 F (37.9 C) (Oral)   Resp 16   LMP 08/01/2022   SpO2 100%   Patient Vitals for the past 24 hrs:  BP Temp Temp src Pulse Resp SpO2  12/11/22 1012 121/75 100.2 F (37.9 C) Oral 87 16 100 %    Physical Exam Vitals reviewed.  Constitutional:      General: She is not in acute distress.    Appearance: She is well-developed. She is not diaphoretic.  Eyes:     General: No scleral icterus. Pulmonary:     Effort: Pulmonary effort is normal. No respiratory distress.  Skin:    General: Skin is warm and dry.  Neurological:     Mental Status: She is alert.     Coordination: Coordination normal.      Cervical Exam    Bedside Ultrasound Not done  My interpretation: n/a  FHT 154 bpm by doppler  Labs Results for orders placed or performed during the hospital encounter of 12/11/22 (from the past 24 hour(s))  Urinalysis, Routine w reflex microscopic -Urine, Clean Catch     Status: Abnormal   Collection Time:  12/11/22 10:16 AM  Result Value Ref Range   Color, Urine YELLOW YELLOW   APPearance HAZY (A) CLEAR   Specific Gravity, Urine 1.019 1.005 - 1.030   pH 5.0 5.0 - 8.0   Glucose, UA NEGATIVE  NEGATIVE mg/dL   Hgb urine dipstick NEGATIVE NEGATIVE   Bilirubin Urine NEGATIVE NEGATIVE   Ketones, ur 20 (A) NEGATIVE mg/dL   Protein, ur NEGATIVE NEGATIVE mg/dL   Nitrite NEGATIVE NEGATIVE   Leukocytes,Ua NEGATIVE NEGATIVE  Wet prep, genital     Status: None   Collection Time: 12/11/22 10:48 AM  Result Value Ref Range   Yeast Wet Prep HPF POC NONE SEEN NONE SEEN   Trich, Wet Prep NONE SEEN NONE SEEN   Clue Cells Wet Prep HPF POC NONE SEEN NONE SEEN   WBC, Wet Prep HPF POC <10 <10   Sperm NONE SEEN     Imaging No results found.  MAU Course  Procedures Lab Orders         Wet prep, genital         Urinalysis, Routine w reflex microscopic -Urine, Clean Catch    Meds ordered this encounter  Medications   ondansetron (ZOFRAN-ODT) disintegrating tablet 8 mg   buprenorphine-naloxone (SUBOXONE) 8-2 mg per SL tablet 1 tablet   loperamide (IMODIUM) capsule 4 mg   acetaminophen (TYLENOL) tablet 1,000 mg   Buprenorphine HCl-Naloxone HCl (SUBOXONE) 8-2 MG FILM    Sig: Take one film in the morning, one film in the afternoon, and a half film in the evening    Dispense:  75 each    Refill:  0   Prenatal Vit-Fe Phos-FA-Omega (VITAFOL GUMMIES) 3.33-0.333-34.8 MG CHEW    Sig: Chew 1 tablet by mouth daily.    Dispense:  90 tablet    Refill:  5   ondansetron (ZOFRAN ODT) 4 MG disintegrating tablet    Sig: Take 1 tablet (4 mg total) by mouth every 6 (six) hours as needed for nausea.    Dispense:  20 tablet    Refill:  5   loperamide (IMODIUM) 2 MG capsule    Sig: Take 1 capsule (2 mg total) by mouth as needed for diarrhea or loose stools.    Dispense:  30 capsule    Refill:  0   Imaging Orders  No imaging studies ordered today    MDM moderate  Assessment and Plan  #Opioid use disorder #[redacted]  weeks gestation of pregnancy Patient presentation most c/w acute opioid withdrawal. Will pre-medicate with zofran and then given usual AM dose of 8 mg suboxone and reassess. Will also give immodium.  10:40 AM  Patient with significant improvement in symptoms after the above medications. Will send refill, will follow up with her tomorrow in clinic.   12:59 PM   #Vaginal discharge Self swabs collected. Wet prep unremarkable, GC/CT still pending at time of discharge.    #FWB Normal FHR by doppler   Dispo: discharged to home in stable condition.    Clarnce Flock, MD/MPH 12/11/22 12:59 PM  Allergies as of 12/11/2022   No Known Allergies      Medication List     STOP taking these medications    NataChew 28-1 MG Chew       TAKE these medications    acetaminophen 500 MG tablet Commonly known as: TYLENOL Take 1,000 mg by mouth every 6 (six) hours as needed.   Buprenorphine HCl-Naloxone HCl 8-2 MG Film Commonly known as: Suboxone Take one film in the morning, one film in the afternoon, and a half film in the evening   docusate sodium 100 MG capsule Commonly known as: COLACE Take 1 capsule (100 mg total) by mouth 2 (two) times daily. Then decrease to once daily  or as needed.   Doxylamine-Pyridoxine 10-10 MG Tbec Commonly known as: Diclegis Take 2 tabs qhs then add 1 tab A.M and midday prn   loperamide 2 MG capsule Commonly known as: IMODIUM Take 1 capsule (2 mg total) by mouth as needed for diarrhea or loose stools.   naloxone 4 MG/0.1ML Liqd nasal spray kit Commonly known as: NARCAN Place 1 spray into the nose once as needed for up to 1 dose (overdose).   ondansetron 4 MG disintegrating tablet Commonly known as: Zofran ODT Take 1 tablet (4 mg total) by mouth every 6 (six) hours as needed for nausea.   promethazine 25 MG tablet Commonly known as: PHENERGAN Take 1 tablet (25 mg total) by mouth every 6 (six) hours as needed for nausea or vomiting.    Vitafol Gummies 3.33-0.333-34.8 MG Chew Chew 1 tablet by mouth daily.

## 2022-12-12 ENCOUNTER — Encounter: Payer: Medicaid Other | Admitting: Family Medicine

## 2022-12-12 ENCOUNTER — Ambulatory Visit: Payer: Medicaid Other | Admitting: *Deleted

## 2022-12-12 ENCOUNTER — Other Ambulatory Visit: Payer: Self-pay | Admitting: *Deleted

## 2022-12-12 ENCOUNTER — Ambulatory Visit: Payer: Medicaid Other

## 2022-12-12 ENCOUNTER — Encounter: Payer: Self-pay | Admitting: *Deleted

## 2022-12-12 VITALS — BP 121/72 | HR 84

## 2022-12-12 DIAGNOSIS — Z3A19 19 weeks gestation of pregnancy: Secondary | ICD-10-CM | POA: Diagnosis not present

## 2022-12-12 DIAGNOSIS — O09292 Supervision of pregnancy with other poor reproductive or obstetric history, second trimester: Secondary | ICD-10-CM | POA: Insufficient documentation

## 2022-12-12 DIAGNOSIS — Z3A14 14 weeks gestation of pregnancy: Secondary | ICD-10-CM | POA: Diagnosis not present

## 2022-12-12 DIAGNOSIS — Z363 Encounter for antenatal screening for malformations: Secondary | ICD-10-CM | POA: Insufficient documentation

## 2022-12-12 DIAGNOSIS — F1991 Other psychoactive substance use, unspecified, in remission: Secondary | ICD-10-CM | POA: Insufficient documentation

## 2022-12-12 DIAGNOSIS — O09899 Supervision of other high risk pregnancies, unspecified trimester: Secondary | ICD-10-CM | POA: Diagnosis not present

## 2022-12-12 DIAGNOSIS — F1190 Opioid use, unspecified, uncomplicated: Secondary | ICD-10-CM | POA: Diagnosis not present

## 2022-12-12 DIAGNOSIS — O09892 Supervision of other high risk pregnancies, second trimester: Secondary | ICD-10-CM | POA: Insufficient documentation

## 2022-12-12 DIAGNOSIS — O99322 Drug use complicating pregnancy, second trimester: Secondary | ICD-10-CM | POA: Diagnosis not present

## 2022-12-12 DIAGNOSIS — F1120 Opioid dependence, uncomplicated: Secondary | ICD-10-CM

## 2022-12-12 LAB — GC/CHLAMYDIA PROBE AMP (~~LOC~~) NOT AT ARMC
Chlamydia: NEGATIVE
Comment: NEGATIVE
Comment: NORMAL
Neisseria Gonorrhea: NEGATIVE

## 2022-12-20 DIAGNOSIS — Z419 Encounter for procedure for purposes other than remedying health state, unspecified: Secondary | ICD-10-CM | POA: Diagnosis not present

## 2022-12-27 ENCOUNTER — Inpatient Hospital Stay (HOSPITAL_COMMUNITY)
Admission: AD | Admit: 2022-12-27 | Discharge: 2022-12-27 | Disposition: A | Discharge status: Home / Self Care | Payer: Medicaid Other | Attending: Obstetrics and Gynecology | Admitting: Obstetrics and Gynecology

## 2022-12-27 ENCOUNTER — Other Ambulatory Visit: Payer: Self-pay

## 2022-12-27 ENCOUNTER — Encounter (HOSPITAL_COMMUNITY): Payer: Self-pay | Admitting: Obstetrics and Gynecology

## 2022-12-27 DIAGNOSIS — O26892 Other specified pregnancy related conditions, second trimester: Secondary | ICD-10-CM | POA: Insufficient documentation

## 2022-12-27 DIAGNOSIS — Z3A21 21 weeks gestation of pregnancy: Secondary | ICD-10-CM | POA: Insufficient documentation

## 2022-12-27 DIAGNOSIS — O99322 Drug use complicating pregnancy, second trimester: Secondary | ICD-10-CM | POA: Insufficient documentation

## 2022-12-27 DIAGNOSIS — R109 Unspecified abdominal pain: Secondary | ICD-10-CM | POA: Insufficient documentation

## 2022-12-27 DIAGNOSIS — O212 Late vomiting of pregnancy: Secondary | ICD-10-CM | POA: Insufficient documentation

## 2022-12-27 DIAGNOSIS — O99332 Smoking (tobacco) complicating pregnancy, second trimester: Secondary | ICD-10-CM | POA: Insufficient documentation

## 2022-12-27 DIAGNOSIS — O219 Vomiting of pregnancy, unspecified: Secondary | ICD-10-CM

## 2022-12-27 DIAGNOSIS — F1120 Opioid dependence, uncomplicated: Secondary | ICD-10-CM

## 2022-12-27 DIAGNOSIS — O09899 Supervision of other high risk pregnancies, unspecified trimester: Secondary | ICD-10-CM

## 2022-12-27 LAB — URINALYSIS, ROUTINE W REFLEX MICROSCOPIC
Bilirubin Urine: NEGATIVE
Glucose, UA: NEGATIVE mg/dL
Hgb urine dipstick: NEGATIVE
Ketones, ur: 80 mg/dL — AB
Leukocytes,Ua: NEGATIVE
Nitrite: NEGATIVE
Protein, ur: 30 mg/dL — AB
Specific Gravity, Urine: 1.023 (ref 1.005–1.030)
pH: 6 (ref 5.0–8.0)

## 2022-12-27 MED ORDER — LACTATED RINGERS IV BOLUS
1000.0000 mL | Freq: Once | INTRAVENOUS | Status: AC
  Administered 2022-12-27: 1000 mL via INTRAVENOUS

## 2022-12-27 MED ORDER — METOCLOPRAMIDE HCL 5 MG/ML IJ SOLN
10.0000 mg | Freq: Once | INTRAMUSCULAR | Status: AC
  Administered 2022-12-27: 10 mg via INTRAVENOUS
  Filled 2022-12-27: qty 2

## 2022-12-27 MED ORDER — BUPRENORPHINE HCL-NALOXONE HCL 8-2 MG SL SUBL: 1.0000 | SUBLINGUAL_TABLET | Freq: Every day | SUBLINGUAL | Status: DC

## 2022-12-27 MED ORDER — FAMOTIDINE IN NACL 20-0.9 MG/50ML-% IV SOLN
20.0000 mg | Freq: Once | INTRAVENOUS | Status: AC
  Administered 2022-12-27: 20 mg via INTRAVENOUS
  Filled 2022-12-27: qty 50

## 2022-12-27 MED ORDER — SODIUM CHLORIDE 0.9 % IV SOLN
8.0000 mg | Freq: Once | INTRAVENOUS | Status: AC
  Administered 2022-12-27: 8 mg via INTRAVENOUS
  Filled 2022-12-27: qty 4

## 2022-12-27 NOTE — MAU Note (Signed)
Sandra Matthews is a 27 y.o. at 49w1dhere in MAU reporting: N/V since last night, states has vomited "a lot" since last night.  States after vomiting feels a tightness in her chest.  Reports feels weak and needs IVF's and nausea medicine. Denies VB or LOF.  Reports +FM. LMP: NA Onset of complaint: last night Pain score: 8 Vitals:   12/27/22 1549  BP: 116/67  Pulse: 79  Resp: 18  Temp: 98.3 F (36.8 C)  SpO2: 100%     FHT:143 bpm Lab orders placed from triage:   UA

## 2022-12-27 NOTE — MAU Provider Note (Signed)
History     CSN: [id number]  Arrival date and time: 12/27/22 1500   Event Date/Time   First Provider Initiated Contact with Patient 12/27/22 1639      Chief Complaint  Patient presents with   Emesis   Nausea   Sandra Matthews is a 27 y.o. [id number] at 15w1d[facility]  She presents today for vomiting.  She states it started last night and has had  "quite a few... a lot" of incidents today.  She reports that she has not had her suboxone in 3 days d/t it being at another location (mother's house) from where she is staying at.  She endorses fetal movement and denies vaginal concerns.  She also reports abdominal cramping that occurs with vomiting.   OB History     Gravida  5   Para  4   Term  3   Preterm  1   AB  0   Living  4      SAB  0   IAB  0   Ectopic  0   Multiple      Live Births  4           Past Medical History:  Diagnosis Date   Anemia    Depression 09/27/2022   PP stuff, was thinking of calling and talking with someone   Preterm labor     Past Surgical History:  Procedure Laterality Date   FRACTURE SURGERY     broken arm in childhood, cast only, no surgery    Family History  Problem Relation Age of Onset   Diabetes Mother    Healthy Mother    Bipolar disorder Father    Depression Father    Anxiety disorder Father    Arthritis Father    Diabetes Maternal Grandmother    Hypertension Maternal Grandfather    Depression Paternal Grandmother     Social History   Tobacco Use   Smoking status: Every Day    Packs/day: 0.50    Years: 2.00    Total pack years: 1.00    Types: Cigarettes   Smokeless tobacco: Never  Vaping Use   Vaping Use: Former   Substances: Nicotine  Substance Use Topics   Alcohol use: No   Drug use: Yes    Types: Marijuana, Other-see comments    Comment: occ takes percocet, when she has pain- she will get one from her cousin; hasn't smoked MJ in "years", last noted in chart 2021     Allergies: No Known Allergies  Medications Prior to Admission  Medication Sig Dispense Refill Last Dose   ondansetron (ZOFRAN ODT) 4 MG disintegrating tablet Take 1 tablet (4 mg total) by mouth every 6 (six) hours as needed for nausea. 20 tablet 5 12/26/2022   acetaminophen (TYLENOL) 500 MG tablet Take 1,000 mg by mouth every 6 (six) hours as needed.      Buprenorphine HCl-Naloxone HCl (SUBOXONE) 8-2 MG FILM Take one film in the morning, one film in the afternoon, and a half film in the evening 75 each 0 12/25/2022   docusate sodium (COLACE) 100 MG capsule Take 1 capsule (100 mg total) by mouth 2 (two) times daily. Then decrease to once daily or as needed. 60 capsule 2    Doxylamine-Pyridoxine (DICLEGIS) 10-10 MG TBEC Take 2 tabs qhs then add 1 tab A.M and midday prn (Patient not taking: Reported on 11/05/2022) 90 tablet 0    loperamide (IMODIUM) 2 MG capsule Take  1 capsule (2 mg total) by mouth as needed for diarrhea or loose stools. 30 capsule 0    naloxone (NARCAN) nasal spray 4 mg/0.1 mL Place 1 spray into the nose once as needed for up to 1 dose (overdose). 1 each 0    Prenatal Vit-Fe Phos-FA-Omega (VITAFOL GUMMIES) 3.33-0.333-34.8 MG CHEW Chew 1 tablet by mouth daily. 90 tablet 5 12/24/2022   promethazine (PHENERGAN) 25 MG tablet Take 1 tablet (25 mg total) by mouth every 6 (six) hours as needed for nausea or vomiting. (Patient not taking: Reported on 11/05/2022) 30 tablet 0     Review of Systems  Gastrointestinal:  Negative for nausea and vomiting.  Genitourinary:  Negative for difficulty urinating, dysuria, vaginal bleeding and vaginal discharge.  Neurological:  Negative for dizziness, light-headedness and headaches.   Physical Exam   Blood pressure 116/67, pulse 79, temperature 98.3 F (36.8 C), temperature source Oral, resp. rate 18, height '5\' 1"'$  (1.549 m), weight 57.2 kg, last menstrual period 08/01/2022, SpO2 100 %.  Physical Exam Vitals reviewed.  Constitutional:       Appearance: Normal appearance.  HENT:     Head: Normocephalic and atraumatic.  Eyes:     Conjunctiva/sclera: Conjunctivae normal.  Cardiovascular:     Rate and Rhythm: Normal rate.  Pulmonary:     Effort: Pulmonary effort is normal. No respiratory distress.  Abdominal:     General: Bowel sounds are normal.  Musculoskeletal:        General: Normal range of motion.     Cervical back: Normal range of motion.  Skin:    General: Skin is warm and dry.  Neurological:     Mental Status: She is alert and oriented to person, place, and time.  Psychiatric:        Mood and Affect: Mood normal.        Behavior: Behavior normal.     MAU Course  Procedures Results for orders placed or performed during the hospital encounter of 12/27/22 (from the past 24 hour(s))  Urinalysis, Routine w reflex microscopic -Urine, Clean Catch     Status: Abnormal   Collection Time: 12/27/22  4:24 PM  Result Value Ref Range   Color, Urine YELLOW YELLOW   APPearance HAZY (A) CLEAR   Specific Gravity, Urine 1.023 1.005 - 1.030   pH 6.0 5.0 - 8.0   Glucose, UA NEGATIVE NEGATIVE mg/dL   Hgb urine dipstick NEGATIVE NEGATIVE   Bilirubin Urine NEGATIVE NEGATIVE   Ketones, ur 80 (A) NEGATIVE mg/dL   Protein, ur 30 (A) NEGATIVE mg/dL   Nitrite NEGATIVE NEGATIVE   Leukocytes,Ua NEGATIVE NEGATIVE   RBC / HPF 0-5 0 - 5 RBC/hpf   WBC, UA 0-5 0 - 5 WBC/hpf   Bacteria, UA RARE (A) NONE SEEN   Squamous Epithelial / HPF 11-20 0 - 5 /HPF   Mucus PRESENT     MDM Start IV LR Bolus x 2 Antiemetic PPI Assessment and Plan  27 year old, [id number]  SIUP at 21.1 weeks Vomiting Suboxone Maintenance   -Reviewed POC with patient. -Exam performed.  -Start I V and give fluids. -Pepcid for pain associated with vomiting.  -Discussed giving zofran for nausea. Patient agreeable. -Patient offered dose of suboxone and agreeable. -Will plan to give once nausea has subsided.    Sandra Matthews 12/27/2022, 4:40 PM    Reassessment (7:13 PM) -Patient s/p reglan. -Reports improvement in nausea. -Discussed giving suboxone tablet and patient questions if she can wait to get her  film when she is home.  She further states her mother is coming to get her and will have said medication. -Informed that this is patient decision.  -Will discontinue order. -Plan for discharge with completion of 2nd LR bolus. -Precautions given. -Patient to follow up as scheduled. -Encouraged to call primary office or return to MAU if symptoms worsen or with the onset of new symptoms. -Discharged to home in improved condition.  Sandra Conners MSN, CNM Advanced Practice Provider, [facility]

## 2023-01-09 ENCOUNTER — Encounter: Payer: Medicaid Other | Admitting: Family Medicine

## 2023-01-16 ENCOUNTER — Ambulatory Visit: Payer: Medicaid Other | Attending: Obstetrics and Gynecology

## 2023-01-16 ENCOUNTER — Ambulatory Visit: Payer: Medicaid Other | Admitting: *Deleted

## 2023-01-16 ENCOUNTER — Other Ambulatory Visit: Payer: Self-pay | Admitting: Obstetrics and Gynecology

## 2023-01-16 VITALS — BP 106/56 | HR 89

## 2023-01-16 DIAGNOSIS — O99322 Drug use complicating pregnancy, second trimester: Secondary | ICD-10-CM

## 2023-01-16 DIAGNOSIS — O09892 Supervision of other high risk pregnancies, second trimester: Secondary | ICD-10-CM | POA: Insufficient documentation

## 2023-01-16 DIAGNOSIS — O09212 Supervision of pregnancy with history of pre-term labor, second trimester: Secondary | ICD-10-CM | POA: Diagnosis not present

## 2023-01-16 DIAGNOSIS — O99332 Smoking (tobacco) complicating pregnancy, second trimester: Secondary | ICD-10-CM | POA: Diagnosis not present

## 2023-01-16 DIAGNOSIS — F1120 Opioid dependence, uncomplicated: Secondary | ICD-10-CM

## 2023-01-16 DIAGNOSIS — F1290 Cannabis use, unspecified, uncomplicated: Secondary | ICD-10-CM | POA: Diagnosis not present

## 2023-01-16 DIAGNOSIS — O09899 Supervision of other high risk pregnancies, unspecified trimester: Secondary | ICD-10-CM

## 2023-01-16 DIAGNOSIS — O365920 Maternal care for other known or suspected poor fetal growth, second trimester, not applicable or unspecified: Secondary | ICD-10-CM

## 2023-01-16 DIAGNOSIS — Z3A24 24 weeks gestation of pregnancy: Secondary | ICD-10-CM

## 2023-01-16 DIAGNOSIS — O09292 Supervision of pregnancy with other poor reproductive or obstetric history, second trimester: Secondary | ICD-10-CM

## 2023-01-20 ENCOUNTER — Other Ambulatory Visit: Payer: Self-pay | Admitting: *Deleted

## 2023-01-20 DIAGNOSIS — F1120 Opioid dependence, uncomplicated: Secondary | ICD-10-CM

## 2023-01-20 DIAGNOSIS — O365920 Maternal care for other known or suspected poor fetal growth, second trimester, not applicable or unspecified: Secondary | ICD-10-CM

## 2023-01-20 DIAGNOSIS — Z419 Encounter for procedure for purposes other than remedying health state, unspecified: Secondary | ICD-10-CM | POA: Diagnosis not present

## 2023-01-20 DIAGNOSIS — O99320 Drug use complicating pregnancy, unspecified trimester: Secondary | ICD-10-CM

## 2023-01-28 ENCOUNTER — Other Ambulatory Visit: Payer: Self-pay

## 2023-01-28 DIAGNOSIS — O09899 Supervision of other high risk pregnancies, unspecified trimester: Secondary | ICD-10-CM

## 2023-01-31 ENCOUNTER — Ambulatory Visit: Payer: Medicaid Other | Admitting: *Deleted

## 2023-01-31 ENCOUNTER — Telehealth: Payer: Self-pay | Admitting: Family Medicine

## 2023-01-31 ENCOUNTER — Ambulatory Visit: Payer: Medicaid Other | Attending: Maternal & Fetal Medicine

## 2023-01-31 VITALS — BP 116/65 | HR 97

## 2023-01-31 DIAGNOSIS — O09899 Supervision of other high risk pregnancies, unspecified trimester: Secondary | ICD-10-CM | POA: Diagnosis not present

## 2023-01-31 DIAGNOSIS — O99322 Drug use complicating pregnancy, second trimester: Secondary | ICD-10-CM

## 2023-01-31 DIAGNOSIS — F1290 Cannabis use, unspecified, uncomplicated: Secondary | ICD-10-CM | POA: Insufficient documentation

## 2023-01-31 DIAGNOSIS — F1120 Opioid dependence, uncomplicated: Secondary | ICD-10-CM | POA: Insufficient documentation

## 2023-01-31 DIAGNOSIS — O09292 Supervision of pregnancy with other poor reproductive or obstetric history, second trimester: Secondary | ICD-10-CM | POA: Diagnosis not present

## 2023-01-31 DIAGNOSIS — O99320 Drug use complicating pregnancy, unspecified trimester: Secondary | ICD-10-CM | POA: Diagnosis not present

## 2023-01-31 DIAGNOSIS — O99332 Smoking (tobacco) complicating pregnancy, second trimester: Secondary | ICD-10-CM | POA: Diagnosis not present

## 2023-01-31 DIAGNOSIS — O365920 Maternal care for other known or suspected poor fetal growth, second trimester, not applicable or unspecified: Secondary | ICD-10-CM | POA: Diagnosis not present

## 2023-01-31 DIAGNOSIS — Z3A26 26 weeks gestation of pregnancy: Secondary | ICD-10-CM | POA: Diagnosis not present

## 2023-01-31 DIAGNOSIS — O09212 Supervision of pregnancy with history of pre-term labor, second trimester: Secondary | ICD-10-CM

## 2023-01-31 NOTE — Telephone Encounter (Signed)
Patient is out of medication would like a nurse to call back.

## 2023-01-31 NOTE — Telephone Encounter (Signed)
Open in error

## 2023-02-01 ENCOUNTER — Other Ambulatory Visit: Payer: Self-pay | Admitting: Family Medicine

## 2023-02-01 DIAGNOSIS — F1190 Opioid use, unspecified, uncomplicated: Secondary | ICD-10-CM

## 2023-02-03 NOTE — Telephone Encounter (Signed)
I called Sandra Matthews and heard a message " text now subscriber not available" I left a message I am returning her call to discuss, and please call office back and I will forward message to provider. Per chart review is on suboxone, I will forward to Dr. Crissie Reese. Nancy Fetter

## 2023-02-03 NOTE — Telephone Encounter (Signed)
Called and spoke with patient. She reports she has not been taking as picked up last prescription in February and that she needs them.   She would like to have Prescription refilled as she has been out for at least a month. She was transparent and reported she has been taking OXY/Roxy as she feels pretty bad while out of strips. She reports she prefers the strips.   Reviewed time of her appt and needs for 2 hour gtt. Advised not to eat or drink after midnight now. Patient voiced understanding.   Will forward to Dr. Crissie Reese.

## 2023-02-04 ENCOUNTER — Ambulatory Visit: Payer: Medicaid Other

## 2023-02-04 ENCOUNTER — Ambulatory Visit: Payer: Medicaid Other | Admitting: Clinical

## 2023-02-04 ENCOUNTER — Other Ambulatory Visit: Payer: Medicaid Other

## 2023-02-04 ENCOUNTER — Other Ambulatory Visit: Payer: Self-pay

## 2023-02-04 ENCOUNTER — Encounter: Payer: Self-pay | Admitting: Family Medicine

## 2023-02-04 ENCOUNTER — Ambulatory Visit (INDEPENDENT_AMBULATORY_CARE_PROVIDER_SITE_OTHER): Payer: Medicaid Other | Admitting: Family Medicine

## 2023-02-04 VITALS — BP 103/68 | HR 85 | Wt 122.0 lb

## 2023-02-04 DIAGNOSIS — F419 Anxiety disorder, unspecified: Secondary | ICD-10-CM | POA: Insufficient documentation

## 2023-02-04 DIAGNOSIS — Z3009 Encounter for other general counseling and advice on contraception: Secondary | ICD-10-CM

## 2023-02-04 DIAGNOSIS — Z3A26 26 weeks gestation of pregnancy: Secondary | ICD-10-CM

## 2023-02-04 DIAGNOSIS — O09899 Supervision of other high risk pregnancies, unspecified trimester: Secondary | ICD-10-CM

## 2023-02-04 DIAGNOSIS — O365920 Maternal care for other known or suspected poor fetal growth, second trimester, not applicable or unspecified: Secondary | ICD-10-CM

## 2023-02-04 DIAGNOSIS — O09892 Supervision of other high risk pregnancies, second trimester: Secondary | ICD-10-CM

## 2023-02-04 DIAGNOSIS — F1190 Opioid use, unspecified, uncomplicated: Secondary | ICD-10-CM

## 2023-02-04 DIAGNOSIS — F32A Depression, unspecified: Secondary | ICD-10-CM | POA: Insufficient documentation

## 2023-02-04 HISTORY — DX: Depression, unspecified: F32.A

## 2023-02-04 MED ORDER — BUPRENORPHINE HCL-NALOXONE HCL 8-2 MG SL FILM: 1.0000 | ORAL_FILM | Freq: Two times a day (BID) | SUBLINGUAL | 0 refills | Status: DC

## 2023-02-04 NOTE — Patient Instructions (Signed)

## 2023-02-04 NOTE — BH Specialist Note (Signed)
Less than 5 minute in-person introduction to [facility] services; pt agrees to virtual visit on 02/10/23 at 3:45pm.

## 2023-02-04 NOTE — Progress Notes (Signed)
Subjective:  Sandra Matthews is a 27 y.o. [phone] at [redacted]w[redacted]d being seen today for ongoing prenatal care.  She is currently monitored for the following issues for this high-risk pregnancy and has History of preterm premature rupture of membranes (PPROM); Marijuana use; IUGR (intrauterine growth restriction) affecting care of mother; Opioid use disorder; and Supervision of other high risk pregnancy, antepartum on their problem list.  Patient reports no complaints.  Contractions: Not present. Vag. Bleeding: None.  Movement: Present. Denies leaking of fluid.   The following portions of the patient's history were reviewed and updated as appropriate: allergies, current medications, past family history, past medical history, past social history, past surgical history and problem list. Problem list updated.  Objective:   Vitals:   02/04/23 0827  BP: 103/68  Pulse: 85  Weight: 122 lb (55.3 kg)    Fetal Status: Fetal Heart Rate (bpm): 138   Movement: Present     General:  Alert, oriented and cooperative. Patient is in no acute distress.  Skin: Skin is warm and dry. No rash noted.   Cardiovascular: Normal heart rate noted  Respiratory: Normal respiratory effort, no problems with respiration noted  Abdomen: Soft, gravid, appropriate for gestational age. Pain/Pressure: Present     Pelvic: Vag. Bleeding: None     Cervical exam deferred        Extremities: Normal range of motion.  Edema: None  Mental Status: Normal mood and affect. Normal behavior. Normal judgment and thought content.   Urinalysis:      Assessment and Plan:  Pregnancy: [id number] at [redacted]w[redacted]d  1. Supervision of other high risk pregnancy, antepartum BP and FHR normal Advised of fastings labs for next visit Contraception discussed, desires nexplanon Has not been seen for prenatal care since new OB three months prior though she has been attending MFM visits Emphasized that the offices are separate and visits here are very  important  2. Opioid use disorder Has not been seen for three months Has been stretching out last prescription which was filled on 12/16/2022 but ran out a while ago, she estimates over two weeks ago Has been using pain pills to get through PDMP reviewed, no rx since that last rx Patient tearful and upset with herself that she has been using pills and not coming to visits Emphasized that we strive for harm reduction approach at our clinic but that I do need to be honest and blunt with her that things are not going well and I am concerned about DCF involvement if we continue on the current path Discussed we have multiple services available to help her get to appointments and help with other social issues Agreed to leave UDS Refill sent for suboxone, will resume at 8 BID though OK to start lower and ramp up as needed - ToxAssure Flex 15, Ur  3. Poor fetal growth affecting management of mother in second trimester, single or unspecified fetus Last growth [place] 01/16/23 EFW 10%, AC 3.6%, AFI wnl Cord dopplers normal to date Emphasized importance of following closely w MFM  4. Anxiety/depression Accepts warm hand off to [facility], they will set up a time to meet again in the near future  Preterm labor symptoms and general obstetric precautions including but not limited to vaginal bleeding, contractions, leaking of fluid and fetal movement were reviewed in detail with the patient. Please refer to After Visit Summary for other counseling recommendations.  Return in 2 weeks (on 02/18/2023) for [facility], ob visit, OUD f/u, needs MD.  Venora Maples, MD

## 2023-02-04 NOTE — BH Specialist Note (Signed)
Integrated Behavioral Health via Telemedicine Visit  02/12/2023 Sandra Matthews [id number]  Number of Integrated Behavioral Health Clinician visits: 1- Initial Visit  Session Start time: 1553   Session End time: 1646  Total time in minutes: 53   Referring Provider: Merian Capron, MD Patient/Family location: [facility] [place] [facility] Provider location: [facility] for [facility] at [facility]  All persons participating in visit: [name] and [facility] [name]   Types of Service: Individual psychotherapy and Telephone visit  I connected with [name] and/or [name] I [name]'s  n/a  via  Telephone or Video Enabled Telemedicine Application  (Video is Caregility application) and verified that I am speaking with the correct person using two identifiers. Discussed confidentiality: Yes   I discussed the limitations of telemedicine and the availability of in person appointments.  Discussed there is a possibility of technology failure and discussed alternative modes of communication if that failure occurs.  I discussed that engaging in this telemedicine visit, they consent to the provision of behavioral healthcare and the services will be billed under their insurance.  Patient and/or legal guardian expressed understanding and consented to Telemedicine visit: Yes   Presenting Concerns: Patient and/or family reports the following symptoms/concerns: Overwhelmed with life stress (staying in mom's house with sister and their children; conflict with children's father after he stopped child support and EBT reduction.); pt's goals are to 1: find stable housing; 2. Obtain CMA licensure Duration of problem: Increase current pregnancy; Severity of problem:  moderately severe  Patient and/or Family's Strengths/Protective Factors: Social connections and Sense of purpose  Goals Addressed: Patient will:  Reduce symptoms of: anxiety, depression, and  stress   Increase knowledge and/or ability of: stress reduction   Demonstrate ability to: Increase healthy adjustment to current life circumstances, Increase adequate support systems for patient/family, and Increase motivation to adhere to plan of care  Progress towards Goals: Ongoing  Interventions: Interventions utilized:  Solution-Focused Strategies, Psychoeducation and/or Health Education, and Link to [facility] Standardized Assessments completed: GAD-7 and PHQ 9  Patient and/or Family Response: Patient agrees with treatment plan.   Assessment: Patient currently experiencing Adjustment disorder with mixed anxious and depressed mood, Psychosocial stress; Opioid use disorder, managed.   Patient may benefit from psychoeducation and brief therapeutic interventions regarding coping with symptoms of anxiety, depression, life stress .  Plan: Follow up with behavioral health clinician on : Two weeks Behavioral recommendations:  -Call [employer] regarding getting on [facility] wait list by no later than tomorrow -Consider additional housing and other resources on After Visit Summary; use as needed -Continue taking Suboxone as prescribed -Continue plan to reapply for child support to resume; continue plan to reapply EBT   Referral(s): Integrated Art gallery manager (In Clinic) and [facility]:  Housing  I discussed the assessment and treatment plan with the patient and/or parent/guardian. They were provided an opportunity to ask questions and all were answered. They agreed with the plan and demonstrated an understanding of the instructions.   They were advised to call back or seek an in-person evaluation if the symptoms worsen or if the condition fails to improve as anticipated.  Rae Lips, LCSW     02/10/2023    4:11 PM 11/13/2022   10:27 AM 01/17/2020    9:10 AM 03/12/2018   10:10 AM 03/04/2018   10:22 AM  Depression screen  PHQ 2/9  Decreased Interest 2 0 0 1 1  Down, Depressed, Hopeless 1 0  0 0 0  PHQ - 2 Score 3 0 0 1 1  Altered sleeping 3 3 0 1 1  Tired, decreased energy 0 1  Change in appetite 1 0 Feeling bad or failure about yourself  1 0 0 0 0  Trouble concentrating 0 0 0 1 1  Moving slowly or fidgety/restless 0 0 0 0 0  Suicidal thoughts 0 0 0 0 0  PHQ-9 Score Difficult doing work/chores   Not difficult at all        02/10/2023    4:15 PM 11/13/2022   10:31 AM 03/12/2018   10:10 AM 03/04/2018   10:22 AM  GAD 7 : Generalized Anxiety Score  Nervous, Anxious, on Edge 1 0 0 1  Control/stop worrying 3 0 1 0  Worry too much - different things 1 0 0 0  Trouble relaxing 1 0 1 0  Restless 0 1 0 0  Easily annoyed or irritable Afraid - awful might happen 1 0 0 0  Total GAD 7 Score 2

## 2023-02-05 ENCOUNTER — Other Ambulatory Visit: Payer: Self-pay | Admitting: Obstetrics and Gynecology

## 2023-02-05 ENCOUNTER — Telehealth: Payer: Self-pay | Admitting: Family Medicine

## 2023-02-05 ENCOUNTER — Other Ambulatory Visit: Payer: Self-pay | Admitting: Family Medicine

## 2023-02-05 ENCOUNTER — Encounter: Payer: Self-pay | Admitting: Family Medicine

## 2023-02-05 DIAGNOSIS — F1190 Opioid use, unspecified, uncomplicated: Secondary | ICD-10-CM

## 2023-02-05 MED ORDER — BUPRENORPHINE HCL-NALOXONE HCL 8-2 MG SL FILM: 1.0000 | ORAL_FILM | Freq: Two times a day (BID) | SUBLINGUAL | 0 refills | Status: DC

## 2023-02-05 NOTE — Telephone Encounter (Signed)
Patient would like a phone call because pharmacy never received medication.

## 2023-02-05 NOTE — Telephone Encounter (Signed)
Notified that patient was having issues with suboxone rx. Reviewed chart, receipt was confirmed by pharmacy.  Called pharmacy and they said they did not have anything. I resent the rx and they will fill it.   I called patient and let her know.

## 2023-02-07 ENCOUNTER — Other Ambulatory Visit: Payer: Self-pay

## 2023-02-07 DIAGNOSIS — O09899 Supervision of other high risk pregnancies, unspecified trimester: Secondary | ICD-10-CM

## 2023-02-09 LAB — TOXASSURE FLEX 15, UR
6-ACETYLMORPHINE IA: NEGATIVE ng/mL
7-aminoclonazepam: NOT DETECTED ng/mg creat
AMPHETAMINES IA: NEGATIVE ng/mL
Alpha-hydroxyalprazolam: NOT DETECTED ng/mg creat
Alpha-hydroxymidazolam: NOT DETECTED ng/mg creat
Alpha-hydroxytriazolam: NOT DETECTED ng/mg creat
Alprazolam: NOT DETECTED ng/mg creat
BARBITURATES IA: NEGATIVE ng/mL
BUPRENORPHINE: POSITIVE
Benzodiazepines: NEGATIVE
Buprenorphine: 3 ng/mg creat
COCAINE METABOLITE IA: NEGATIVE ng/mL
Clonazepam: NOT DETECTED ng/mg creat
Creatinine: 228 mg/dL
Desalkylflurazepam: NOT DETECTED ng/mg creat
Desmethyldiazepam: NOT DETECTED ng/mg creat
Desmethylflunitrazepam: NOT DETECTED ng/mg creat
Diazepam: NOT DETECTED ng/mg creat
ETHYL ALCOHOL Enzymatic: NEGATIVE g/dL
FENTANYL: NEGATIVE
Fentanyl: NOT DETECTED ng/mg creat
Flunitrazepam: NOT DETECTED ng/mg creat
Lorazepam: NOT DETECTED ng/mg creat
METHADONE IA: NEGATIVE ng/mL
METHADONE MTB IA: NEGATIVE ng/mL
Midazolam: NOT DETECTED ng/mg creat
Norbuprenorphine: 16 ng/mg creat
Norfentanyl: NOT DETECTED ng/mg creat
Oxazepam: NOT DETECTED ng/mg creat
PHENCYCLIDINE IA: NEGATIVE ng/mL
TAPENTADOL, IA: NEGATIVE ng/mL
TRAMADOL IA: NEGATIVE ng/mL
Temazepam: NOT DETECTED ng/mg creat

## 2023-02-09 LAB — CANNABINOIDS, MS, UR RFX
Cannabinoids Confirmation: POSITIVE
Carboxy-THC: 40 ng/mg creat

## 2023-02-09 LAB — OPIATE CLASS, MS, UR RFX
Codeine: NOT DETECTED ng/mg creat
Dihydrocodeine: NOT DETECTED ng/mg creat
Hydrocodone: NOT DETECTED ng/mg creat
Hydromorphone: NOT DETECTED ng/mg creat
Morphine: NOT DETECTED ng/mg creat
Norcodeine: NOT DETECTED ng/mg creat
Norhydrocodone: NOT DETECTED ng/mg creat
Normorphine: NOT DETECTED ng/mg creat
Opiate Class Confirmation: NEGATIVE

## 2023-02-09 LAB — OXYCODONE CLASS, MS, UR RFX
Noroxycodone: >4386 ng/mg creat
Noroxymorphone: >4386 ng/mg creat
Oxycodone Class Confirmation: POSITIVE
Oxycodone: 1550 ng/mg creat
Oxymorphone: >4386 ng/mg creat

## 2023-02-10 ENCOUNTER — Ambulatory Visit (INDEPENDENT_AMBULATORY_CARE_PROVIDER_SITE_OTHER): Payer: Medicaid Other | Admitting: Clinical

## 2023-02-10 DIAGNOSIS — Z658 Other specified problems related to psychosocial circumstances: Secondary | ICD-10-CM

## 2023-02-10 DIAGNOSIS — F4323 Adjustment disorder with mixed anxiety and depressed mood: Secondary | ICD-10-CM

## 2023-02-10 DIAGNOSIS — F1190 Opioid use, unspecified, uncomplicated: Secondary | ICD-10-CM

## 2023-02-10 NOTE — Patient Instructions (Addendum)
Center for [facility] Healthcare at [facility] [address] [place], [place] [address] [phone] (main office) [phone] (Rika Daughdrill's office)  [facility] www.womenscentergso.com  Housing Resources                    [employer] (serves [place], [place], [place], [place], [place], [place], [place], [place], [place], [place], [place], [place], and [place] counties) [address] [place], [place] [address] [phone] DeveloperU.ch  **Rental assistance, Home Rehabilitation,Weatherization Assistance Program, Chief Financial Officer, Housing Voucher Program  [employer] for Housing and [employer] Studies: Proofreader Resources to residents of [place], [place], and [place] Make sure you have your documents ready, including:  (Household income verification: 2 months pay stubs, unemployment/social security award letter, statement of no income for all household members over 82) Photo ID for all household members over 18 Utility Bill/Rent Ledger/Lease: must show past due amount for utilities/rent, or the rental agreement if rent is current 2. Start your application online or by paper (in [place] or [place]) at:     [email or web address].[email or web address]  3. Once you have completed the online application, you will get an email confirmation message from the county. Expect to hear back by phone or email at least 6-10 weeks from submitting your application.  4. While you wait:  Call [phone] to check in on your application Let your landlord know that you've applied. Your landlord will be asked to submit documents (W-9) during this application process. Payments will be made directly to the landlord/property management company and utility company Rent or utility assistance for [employer], [place], and [place] Apply at  https://rb.gy/[email or web address] Questions? Call or email Renee at [phone] or [email or web address]   Eviction Mediation Program: The HOPE Program Https://www.rebuild.BedroomRental.com.cy HOPE Progam serves low-income renters in [address] [place] counties, defined as less than or equal to 80% of the area median income for the county where the renter lives. In the following 12 counties, you should apply to your local rent and utility assistance program INSTEAD OF the HOPE Program: [place], [place], [place], [place], [place], [place], [place], [place], [place], [place], [place], [place]  If you live outside of [place], contact HOPE call center at [phone] to talk to a Program Representative Monday-Friday, 8am-5pm Note that Native American tribes also received federal funding for rent and utility assistance programs. Recognized members of the following tribes will be served by programs managed by tribal governments, including: [place] Indians, [place], [place], [place] and [facility] Eviction Mediation Coordinator, [place], [phone] [email or web address] .[employer], [place], [phone] [email or web address]   Housing Resources [facility]- [place] [address] [place], [place] [address] [phone] www.gha-[place].[facility] [address] Tawny Hopping [place], [place] [address] [phone] PhoneCaptions.ch **Programs include: Hospital doctor and Housing Counseling, Healthy Radiographer, therapeutic, Homeless Prevention and Housing Assistance  Government [facility] [address], [place], [place] [address] [phone] www.PaintingEmporium.co.za **housing applications/recertification; tax payment relief/exemption under specific qualifications  [facility] [address], [place], [place]  [address] www.onlinegreensboro.com/~maryshouse **transitional housing for women in recovery who have minor children or are pregnant  [facility] [address] [place], [place], [place] [address] https://johnson-smith.net/  **emergency shelter and support services for families facing homelessness  [facility] [address], [place], [place] [address] [phone] www.youthfocus.org **transitional housing to pregnant women; emergency housing for youth who have run  away, are experiencing a family crisis, are victims of abuse or neglect, or are homeless  [facility] [address] [place], [place] [address] [phone] ircgso.org **Drop-in center for people experiencing homelessness; overnight warming center when temperature is 25 degrees or below  Re[employer]ing [address] [place], [place], [place] [address] [phone] https://reentrystaffingagency.org/ **help with affordable housing to people experiencing homelessness or unemployment due to incarceration  [facility] [address], [place], [place] [address] [phone]  www.greensborourbanministry.org  **emergency and transitional housing, rent/mortgage assistance, utility assistance  [facility] [address] [place], [place] [address] [phone] www.salvationarmyofgreensboro.org **emergency and transitional housing  [employer] for [employer] [address], [place], [place] [address] [phone] Www.habitatgreensboro.[facility]   [employer] [address], [place], [place] [address] [phone] https://chshousing.org **Home Ownership/Affordable Housing Program and [facility]  [employer] [facility] [address], [place], [place] [address] [phone] PaidValue.com.cy **home buyer education courses, foreclosure prevention  [facility] [address] [place], [place] [address] [phone] DefMagazine.is **Environmental Exposure Assessment (investigation of homes where either children or pregnant women with a confirmed elevated blood lead level reside)  [facility] of [facility] [address] Nat [place], [place] [address] [phone] ShowReturn.ca **Home Expense Assistance/Repairs Program; offers home accessibility updates, such as ramps or bars in the bathroom  [facility]Help [facility] [address], [place], [place] [address] [phone] https://www.self-help.org/locations/[place]-branch **Offers credit-building and banking services to people unable to use traditional banking   Housing Resources [facility]  Housing Authority- [place] [facility] [address] Cinda Quest [place], [place] [address] [phone] ChatRepair.pl   [facility] [address], [place], [place] [address] [phone]  WrestlingMonthly.pl **housing applications/recertification, emergency and transitional housing  Open [employer] of [employer] [address] [place], [place] [address] [phone] www.odm-hp.org  **emergency and permanent housing; rent/mortgage payment assistance  [employer]ab[employer] for [employer], [place] and [place] [address], [place], [place] [address] [phone] https://russell-walls.com/  Family Services of the [place], [employer] [address] [place], [place], [place] [address] www.familyservice-piedmont.org **emergency shelter for victims of domestic violence and sexual assault  [facility] [address], [place], [place] [address] [phone] www.senior-resources-guilford.org **Home expense assistance/repairs for older adults

## 2023-02-11 ENCOUNTER — Encounter (HOSPITAL_COMMUNITY): Payer: Self-pay | Admitting: Obstetrics & Gynecology

## 2023-02-11 ENCOUNTER — Telehealth: Payer: Self-pay | Admitting: *Deleted

## 2023-02-11 ENCOUNTER — Telehealth: Payer: Self-pay | Admitting: Lactation Services

## 2023-02-11 ENCOUNTER — Inpatient Hospital Stay (HOSPITAL_COMMUNITY)
Admission: AD | Admit: 2023-02-11 | Discharge: 2023-02-11 | Disposition: A | Discharge status: Home / Self Care | Payer: Medicaid Other | Attending: Obstetrics & Gynecology | Admitting: Obstetrics & Gynecology

## 2023-02-11 ENCOUNTER — Ambulatory Visit: Payer: Medicaid Other

## 2023-02-11 DIAGNOSIS — A084 Viral intestinal infection, unspecified: Secondary | ICD-10-CM

## 2023-02-11 DIAGNOSIS — Z3689 Encounter for other specified antenatal screening: Secondary | ICD-10-CM

## 2023-02-11 DIAGNOSIS — O26892 Other specified pregnancy related conditions, second trimester: Secondary | ICD-10-CM | POA: Insufficient documentation

## 2023-02-11 DIAGNOSIS — Z3A27 27 weeks gestation of pregnancy: Secondary | ICD-10-CM | POA: Insufficient documentation

## 2023-02-11 DIAGNOSIS — R112 Nausea with vomiting, unspecified: Secondary | ICD-10-CM

## 2023-02-11 DIAGNOSIS — O09899 Supervision of other high risk pregnancies, unspecified trimester: Secondary | ICD-10-CM

## 2023-02-11 LAB — BASIC METABOLIC PANEL
Anion gap: 10 (ref 5–15)
BUN: 7 mg/dL (ref 6–20)
CO2: 22 mmol/L (ref 22–32)
Calcium: 9.3 mg/dL (ref 8.9–10.3)
Chloride: 101 mmol/L (ref 98–111)
Creatinine, Ser: 0.54 mg/dL (ref 0.44–1.00)
GFR, Estimated: >60 mL/min (ref >60)
Glucose, Bld: 85 mg/dL (ref 70–99)
Potassium: 3.5 mmol/L (ref 3.5–5.1)
Sodium: 133 mmol/L — ABNORMAL LOW (ref 135–145)

## 2023-02-11 LAB — CBC WITH DIFFERENTIAL/PLATELET
Abs Immature Granulocytes: 0.22 10*3/uL — ABNORMAL HIGH (ref 0.00–0.07)
Basophils Absolute: 0 10*3/uL (ref 0.0–0.1)
Basophils Relative: 0 %
Eosinophils Absolute: 0 10*3/uL (ref 0.0–0.5)
Eosinophils Relative: 0 %
HCT: 35 % — ABNORMAL LOW (ref 36.0–46.0)
Hemoglobin: 12.1 g/dL (ref 12.0–15.0)
Immature Granulocytes: 1 %
Lymphocytes Relative: 5 %
Lymphs Abs: 0.8 10*3/uL (ref 0.7–4.0)
MCH: 31.5 pg (ref 26.0–34.0)
MCHC: 34.6 g/dL (ref 30.0–36.0)
MCV: 91.1 fL (ref 80.0–100.0)
Monocytes Absolute: 0.6 10*3/uL (ref 0.1–1.0)
Monocytes Relative: 3 %
Neutro Abs: 15.4 10*3/uL — ABNORMAL HIGH (ref 1.7–7.7)
Neutrophils Relative %: 91 %
Platelets: 449 10*3/uL — ABNORMAL HIGH (ref 150–400)
RBC: 3.84 MIL/uL — ABNORMAL LOW (ref 3.87–5.11)
RDW: 13.2 % (ref 11.5–15.5)
WBC: 17.1 10*3/uL — ABNORMAL HIGH (ref 4.0–10.5)
nRBC: 0 % (ref 0.0–0.2)

## 2023-02-11 MED ORDER — PANTOPRAZOLE SODIUM 40 MG IV SOLR
40.0000 mg | Freq: Once | INTRAVENOUS | Status: AC
  Administered 2023-02-11: 40 mg via INTRAVENOUS
  Filled 2023-02-11: qty 10

## 2023-02-11 MED ORDER — SODIUM CHLORIDE 0.9 % IV SOLN
12.5000 mg | Freq: Once | INTRAVENOUS | Status: AC
  Administered 2023-02-11: 12.5 mg via INTRAVENOUS
  Filled 2023-02-11: qty 0.5

## 2023-02-11 MED ORDER — LACTATED RINGERS IV BOLUS
1000.0000 mL | Freq: Once | INTRAVENOUS | Status: AC
  Administered 2023-02-11: 1000 mL via INTRAVENOUS

## 2023-02-11 MED ORDER — PROMETHAZINE HCL 25 MG PO TABS: 25.0000 mg | ORAL_TABLET | Freq: Four times a day (QID) | ORAL | 0 refills | Status: DC | PRN

## 2023-02-11 MED ORDER — ONDANSETRON 4 MG PO TBDP: 4.0000 mg | ORAL_TABLET | Freq: Four times a day (QID) | ORAL | 1 refills | Status: DC | PRN

## 2023-02-11 NOTE — BH Specialist Note (Signed)
Pt did not arrive to video visit and did not answer the phone; Left HIPPA-compliant message to call back Chiniqua Kilcrease from [facility] at  [phone] (Aaro Meyers's office).  ?; left MyChart message for patient.  ? ?

## 2023-02-11 NOTE — MAU Note (Signed)
..  Sandra Matthews is a 27 y.o. at [redacted]w[redacted]d here in MAU reporting: yesterday she started having nausea and vomting, denies diarrhea. Also has had DFM since last night. Only has abdominal pain when she throws up. She took zofran this morning but it did not help.   Pain score: 10 Vitals:   02/11/23 1635  BP: 121/66  Pulse: 98  Resp: 15  Temp: 98.3 F (36.8 C)  SpO2: 98%     FHT:156 Lab orders placed from triage: UA

## 2023-02-11 NOTE — Telephone Encounter (Signed)
Called and spoke with patient. She reports she was able to pick up her prescription.   She reports she is vomiting today, she vomits each time she has a sharp pain in her abdomen. She reports she has been vomiting all day and only has bile to throw up. Advised her to call [facility] to discuss with provider. Patient given phone number to call [facility].

## 2023-02-11 NOTE — Telephone Encounter (Signed)
Patient called in to say her Suboxone was not at the Pharmacy. Called the Pharmacy and prescription was picked up on 4/17.

## 2023-02-11 NOTE — Telephone Encounter (Signed)
TC from pt reporting "baby balling up" and vomiting and decreased FM. Advised pt to seek care in MAU immediately. Pt reports having no transportation or childcare. Urged pt to seek assistance form any friend or family member she could. Stressed urgency of decreased FM and potential for PTL. Pt verbalized understanding.

## 2023-02-11 NOTE — MAU Provider Note (Signed)
History     CSN: [id number]  Arrival date and time: 02/11/23 1627   Event Date/Time   First Provider Initiated Contact with Patient 02/11/23 1715      Chief Complaint  Patient presents with   Emesis   Nausea   Abdominal Pain   Decreased Fetal Movement   Emesis  Associated symptoms include abdominal pain.  Abdominal Pain Associated symptoms include vomiting.   Patient is 27 y.o. [id number] [redacted]w[redacted]d here with complaints of nausea and vomiting. Patient reports she was in normal state of health last night. Today she started having vomiting and reports about 5 episodes of emesis, non bloody, non bilious. Patient is on suboxone, taking her dose at night. She did not vomit her medications. She reports that zofran is constipating.  Patient at a new restaurant yesterday - Reports noting that cook was not wearing gloves. Reported her son had vomiting last night and loose stools today.  Reports abdominal pain-- epigastric in location that is described as crampy.   +FM- but subjectively decreased, denies LOF, VB, contractions, vaginal discharge.  OB History     Gravida  5   Para  4   Term  3   Preterm  1   AB  0   Living  4      SAB  0   IAB  0   Ectopic  0   Multiple      Live Births  4           Past Medical History:  Diagnosis Date   Anemia    Depression 09/27/2022   PP stuff, was thinking of calling and talking with someone   Preterm labor     Past Surgical History:  Procedure Laterality Date   FRACTURE SURGERY     broken arm in childhood, cast only, no surgery    Family History  Problem Relation Age of Onset   Diabetes Mother    Healthy Mother    Bipolar disorder Father    Depression Father    Anxiety disorder Father    Arthritis Father    Diabetes Maternal Grandmother    Hypertension Maternal Grandfather    Depression Paternal Grandmother     Social History   Tobacco Use   Smoking status: Every Day    Packs/day: 0.50    Years: 2.00     Additional pack years: 0.00    Total pack years: 1.00    Types: Cigarettes   Smokeless tobacco: Never  Vaping Use   Vaping Use: Former   Substances: Nicotine  Substance Use Topics   Alcohol use: No   Drug use: Not Currently    Types: Marijuana    Comment: not in years    Allergies: No Known Allergies  Medications Prior to Admission  Medication Sig Dispense Refill Last Dose   Buprenorphine HCl-Naloxone HCl (SUBOXONE) 8-2 MG FILM Place 1 Film under the tongue 2 (two) times daily for 21 days. Take one film in the morning, one film in the afternoon, and a half film in the evening 42 Film 0 02/10/2023   ondansetron (ZOFRAN ODT) 4 MG disintegrating tablet Take 1 tablet (4 mg total) by mouth every 6 (six) hours as needed for nausea. 20 tablet 5 02/11/2023   acetaminophen (TYLENOL) 500 MG tablet Take 1,000 mg by mouth every 6 (six) hours as needed. (Patient not taking: Reported on 02/04/2023)      aspirin-acetaminophen-caffeine (EXCEDRIN MIGRAINE) 250-250-65 MG tablet Take by mouth every 6 (six) hours  as needed for headache.      docusate sodium (COLACE) 100 MG capsule Take 1 capsule (100 mg total) by mouth 2 (two) times daily. Then decrease to once daily or as needed. 60 capsule 2    Doxylamine-Pyridoxine (DICLEGIS) 10-10 MG TBEC Take 2 tabs qhs then add 1 tab A.M and midday prn (Patient not taking: Reported on 02/04/2023) 90 tablet 0    loperamide (IMODIUM) 2 MG capsule Take 1 capsule (2 mg total) by mouth as needed for diarrhea or loose stools. (Patient not taking: Reported on 02/04/2023) 30 capsule 0    naloxone (NARCAN) nasal spray 4 mg/0.1 mL Place 1 spray into the nose once as needed for up to 1 dose (overdose). (Patient not taking: Reported on 01/16/2023) 1 each 0    Prenatal Vit-Fe Phos-FA-Omega (VITAFOL GUMMIES) 3.33-0.333-34.8 MG CHEW Chew 1 tablet by mouth daily. (Patient not taking: Reported on 02/04/2023) 90 tablet 5    promethazine (PHENERGAN) 25 MG tablet Take 1 tablet (25 mg total) by  mouth every 6 (six) hours as needed for nausea or vomiting. (Patient not taking: Reported on 11/05/2022) 30 tablet 0     Review of Systems  Gastrointestinal:  Positive for abdominal pain and vomiting.   Physical Exam   Blood pressure 116/69, pulse 83, temperature 98.3 F (36.8 C), temperature source Oral, resp. rate 16, weight 55 kg, last menstrual period 08/01/2022, SpO2 98 %.  Physical Exam Vitals and nursing note reviewed.  Constitutional:      General: She is not in acute distress.    Appearance: She is well-developed.     Comments: Pregnant female  HENT:     Head: Normocephalic and atraumatic.  Eyes:     General: No scleral icterus.    Conjunctiva/sclera: Conjunctivae normal.  Cardiovascular:     Rate and Rhythm: Normal rate.  Pulmonary:     Effort: Pulmonary effort is normal.  Chest:     Chest wall: No tenderness.  Abdominal:     Palpations: Abdomen is soft.     Tenderness: There is abdominal tenderness in the epigastric area. There is no guarding or rebound.     Comments: Gravid  Genitourinary:    Vagina: Normal.  Musculoskeletal:        General: Normal range of motion.     Cervical back: Normal range of motion and neck supple.  Skin:    General: Skin is warm and dry.     Findings: No rash.  Neurological:     Mental Status: She is alert and oriented to person, place, and time.     MAU Course  Procedures NST I reviewed the patient's fetal monitoring.  Baseline HR: 145 Variability:  moderate Accels:present Decels: none  A/P: Reactive NST  Reassured regarding fetal status. Able to hear multiple fetal movements and patient noted normal movement while in MAU   MDM: High  This patient presents to the ED for concern of   Chief Complaint  Patient presents with   Emesis   Nausea   Abdominal Pain   Decreased Fetal Movement     These complains involves an extensive number of treatment options, and is a complaint that carries with it a high risk of  complications and morbidity.  The differential diagnosis for  1. Emesis INCLUDES viral gastroenteritis, food poisoning, less likely preeclampsia   Co morbidities that complicate the patient evaluation: Patient Active Problem List   Diagnosis Date Noted   Anxiety and depression 02/04/2023   Supervision of  other high risk pregnancy, antepartum 11/13/2022   Opioid use disorder 11/05/2022   IUGR (intrauterine growth restriction) affecting care of mother 03/17/2018   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   History of preterm premature rupture of membranes (PPROM) 12/17/2017    External records from outside source obtained and reviewed including Prenatal care records  Lab Tests: CMP and CBC  I ordered, and personally interpreted labs.  The pertinent results include:  pertinent negatives. WBC was elevated consistent with possible viral illness.   Critical Interventions: IV fluids  MAU Course: 7:51 PM s/p 2 L IVF with phenergan. Labs s/f elevated WBC at 17.1 but otherwise WNL. Patient has eaten crackers and sips of fluids. She is worried about the nausea and vomiting returning.   After the interventions noted above, I reevaluated the patient and found that they have :improved  Dispostion: discharged   Assessment and Plan   1. Viral gastroenteritis   2. Supervision of other high risk pregnancy, antepartum   3. Nausea and vomiting, unspecified vomiting type   4. [redacted] weeks gestation of pregnancy   5. NST (non-stress test) reactive     - Most likely viral gastro, possibly food borne - Counseled about likely self limiting illness - Return precautions reviewed  - Provided medications to help with sx.   Future Appointments  Date Time Provider Department Center  02/14/2023  9:30 AM [facility]  02/18/2023  1:15 PM Venora Maples, MD [facility]  02/25/2023 10:45 AM [facility] HEALTH CLINICIAN [facility] [address] [facility]     Federico Flake 02/11/2023, 7:51 PM

## 2023-02-12 ENCOUNTER — Telehealth: Payer: Self-pay | Admitting: Clinical

## 2023-02-12 NOTE — Telephone Encounter (Signed)
Attempt to return pt call; Left HIPPA-compliant message to call back Torrin Crihfield from [facility] at  [phone] (Sanjuan Sawa's office).   

## 2023-02-14 ENCOUNTER — Other Ambulatory Visit: Payer: Medicaid Other

## 2023-02-18 ENCOUNTER — Encounter: Payer: Self-pay | Admitting: Family Medicine

## 2023-02-25 ENCOUNTER — Telehealth: Payer: Self-pay | Admitting: Family Medicine

## 2023-02-25 ENCOUNTER — Ambulatory Visit: Payer: Medicaid Other | Admitting: Clinical

## 2023-02-25 DIAGNOSIS — F1190 Opioid use, unspecified, uncomplicated: Secondary | ICD-10-CM

## 2023-02-25 DIAGNOSIS — Z91199 Patient's noncompliance with other medical treatment and regimen due to unspecified reason: Secondary | ICD-10-CM

## 2023-02-25 MED ORDER — BUPRENORPHINE HCL-NALOXONE HCL 8-2 MG SL FILM
1.0000 | ORAL_FILM | Freq: Two times a day (BID) | SUBLINGUAL | 0 refills | Status: AC
Start: 2023-02-25 — End: 2023-03-04

## 2023-02-25 NOTE — Telephone Encounter (Signed)
Per Crissie Reese, if patient is able to come in today and leave urine sample, we can refill Rx; otherwise, we can refill Rx for short supply and patient will need to come in later this week in order to refill future Rx's.   Called patient at number listed in chart--[phone]--identified patient with name and DOB. Patient unable to come in today, but she is coming in for an [facility] appointment on 02/27/23 at 1435. Patient will leave urine sample for UDS at that appointment; explained to patient that we will complete a short refill for her Rx, but for future refills she will need to leave a urine sample when she comes in for her appointment. Patient verbalized understanding and did not have any questions or concerns.   Dr. Crissie Reese sent in message regarding refill to patient via MyChart.   Maureen Ralphs RN on 02/25/23 at [phone]

## 2023-02-25 NOTE — Telephone Encounter (Signed)
Patient is out of suboxone would like a call back

## 2023-02-27 ENCOUNTER — Other Ambulatory Visit (HOSPITAL_COMMUNITY)
Admission: RE | Admit: 2023-02-27 | Discharge: 2023-02-27 | Disposition: A | Discharge status: Home / Self Care | Payer: Medicaid Other | Source: Ambulatory Visit | Attending: Family Medicine | Admitting: Family Medicine

## 2023-02-27 ENCOUNTER — Ambulatory Visit (INDEPENDENT_AMBULATORY_CARE_PROVIDER_SITE_OTHER): Payer: Self-pay | Admitting: Family Medicine

## 2023-02-27 ENCOUNTER — Other Ambulatory Visit: Payer: Self-pay

## 2023-02-27 VITALS — BP 119/76 | HR 110 | Wt 123.6 lb

## 2023-02-27 DIAGNOSIS — F32A Depression, unspecified: Secondary | ICD-10-CM

## 2023-02-27 DIAGNOSIS — N898 Other specified noninflammatory disorders of vagina: Secondary | ICD-10-CM | POA: Insufficient documentation

## 2023-02-27 DIAGNOSIS — O365930 Maternal care for other known or suspected poor fetal growth, third trimester, not applicable or unspecified: Secondary | ICD-10-CM

## 2023-02-27 DIAGNOSIS — F419 Anxiety disorder, unspecified: Secondary | ICD-10-CM

## 2023-02-27 DIAGNOSIS — F1190 Opioid use, unspecified, uncomplicated: Secondary | ICD-10-CM

## 2023-02-27 DIAGNOSIS — O09899 Supervision of other high risk pregnancies, unspecified trimester: Secondary | ICD-10-CM

## 2023-02-27 MED ORDER — PROMETHAZINE HCL 25 MG PO TABS: 25.0000 mg | ORAL_TABLET | Freq: Four times a day (QID) | ORAL | 0 refills | Status: DC | PRN

## 2023-02-27 NOTE — Progress Notes (Signed)
   PRENATAL VISIT NOTE  Subjective:  Sandra Matthews is a 27 y.o. [phone] at [redacted]w[redacted]d being seen today for ongoing prenatal care.  She is currently monitored for the following issues for this high-risk pregnancy and has History of preterm premature rupture of membranes (PPROM); Marijuana use; IUGR (intrauterine growth restriction) affecting care of mother; Opioid use disorder; Supervision of other high risk pregnancy, antepartum; and Anxiety and depression on their problem list.  Patient reports no bleeding, no contractions, no cramping, no leaking, and vaginal irritation.  Contractions: Not present. Vag. Bleeding: None.  Movement: Present. Denies leaking of fluid.   The following portions of the patient's history were reviewed and updated as appropriate: allergies, current medications, past family history, past medical history, past social history, past surgical history and problem list.   Objective:   Vitals:   02/27/23 1453  BP: 119/76  Pulse: (!) 110  Weight: 123 lb 9.6 oz (56.1 kg)    Fetal Status: Fetal Heart Rate (bpm): 140   Movement: Present     General:  Alert, oriented and cooperative. Patient is in no acute distress.  Skin: Skin is warm and dry. No rash noted.   Cardiovascular: Normal heart rate noted  Respiratory: Normal respiratory effort, no problems with respiration noted  Abdomen: Soft, gravid, appropriate for gestational age.  Pain/Pressure: Present     Pelvic: Self swab collected          Extremities: Normal range of motion.  Edema: None  Mental Status: Normal mood and affect. Normal behavior. Normal judgment and thought content.   Assessment and Plan:  Pregnancy: [id number] at [redacted]w[redacted]d 1. Supervision of other high risk pregnancy, antepartum Continue high risk routine prenatal care Patient with abnormal discharge.  Self swab collected and will call patient with results when they come back.  2. Opioid use disorder Patient reports that she has been having issues  with her opioid use.  She ran out of her prescription so she has been using street obtained Roxicodone.  Patient discusses that she understands that her relapses may affect her custody of her child after delivery.  She is concerned about CPS involvement if she continues this drug use. Spoke with Dr. Tamera Punt who reports he has sent in a prescription take tide her over until her next visit with him. - ToxAssure Flex 15, Ur  3. Poor fetal growth affecting management of mother in third trimester, single or unspecified fetus 10th percentile growth on 3/28.  OB follow-up scheduled for 5/16  4. Anxiety and depression Has missed appointments with Asher Muir  Preterm labor symptoms and general obstetric precautions including but not limited to vaginal bleeding, contractions, leaking of fluid and fetal movement were reviewed in detail with the patient. Please refer to After Visit Summary for other counseling recommendations.   No follow-ups on file.  Future Appointments  Date Time Provider Department Center  03/05/2023  8:20 AM [facility] LAB [facility]  03/06/2023  1:30 PM [facility] NURSE [facility]  03/06/2023  1:45 PM [facility] US6 [facility]  03/13/2023  3:15 PM Crissie Reese, Mary Sella, MD [facility]    Celedonio Savage, MD

## 2023-02-28 LAB — CERVICOVAGINAL ANCILLARY ONLY
Bacterial Vaginitis (gardnerella): POSITIVE — AB
Candida Glabrata: NEGATIVE
Candida Vaginitis: POSITIVE — AB
Comment: NEGATIVE
Comment: NEGATIVE
Comment: NEGATIVE
Comment: NEGATIVE
Trichomonas: POSITIVE — AB

## 2023-03-01 ENCOUNTER — Telehealth: Payer: Self-pay | Admitting: Family Medicine

## 2023-03-01 MED ORDER — FLUCONAZOLE 150 MG PO TABS: 150.0000 mg | ORAL_TABLET | Freq: Once | ORAL | 0 refills | Status: AC

## 2023-03-01 MED ORDER — METRONIDAZOLE 500 MG PO TABS: 500.0000 mg | ORAL_TABLET | Freq: Two times a day (BID) | ORAL | 0 refills | Status: DC

## 2023-03-01 NOTE — Telephone Encounter (Signed)
Called patient to discuss wet prep results.  Positive for trichomoniasis, BV, yeast yeast.  Sent prescription for Flagyl for 7 days as well as single dose of Diflucan to patient's pharmacy.  No further questions or concerns.

## 2023-03-03 LAB — OXYCODONE CLASS, MS, UR RFX
Noroxycodone: 6788 ng/mg creat
Noroxymorphone: 3002 ng/mg creat
Oxycodone Class Confirmation: POSITIVE
Oxycodone: 4003 ng/mg creat
Oxymorphone: 7688 ng/mg creat

## 2023-03-03 LAB — TOXASSURE FLEX 15, UR
6-ACETYLMORPHINE IA: NEGATIVE ng/mL
7-aminoclonazepam: NOT DETECTED ng/mg creat
AMPHETAMINES IA: NEGATIVE ng/mL
Alpha-hydroxyalprazolam: NOT DETECTED ng/mg creat
Alpha-hydroxymidazolam: NOT DETECTED ng/mg creat
Alpha-hydroxytriazolam: NOT DETECTED ng/mg creat
Alprazolam: NOT DETECTED ng/mg creat
BARBITURATES IA: NEGATIVE ng/mL
BUPRENORPHINE: POSITIVE
Benzodiazepines: NEGATIVE
Buprenorphine: 4 ng/mg creat
COCAINE METABOLITE IA: NEGATIVE ng/mL
Clonazepam: NOT DETECTED ng/mg creat
Creatinine: 105 mg/dL
Desalkylflurazepam: NOT DETECTED ng/mg creat
Desmethyldiazepam: NOT DETECTED ng/mg creat
Desmethylflunitrazepam: NOT DETECTED ng/mg creat
Diazepam: NOT DETECTED ng/mg creat
ETHYL ALCOHOL Enzymatic: NEGATIVE g/dL
FENTANYL: NEGATIVE
Fentanyl: NOT DETECTED ng/mg creat
Flunitrazepam: NOT DETECTED ng/mg creat
Lorazepam: NOT DETECTED ng/mg creat
METHADONE IA: NEGATIVE ng/mL
METHADONE MTB IA: NEGATIVE ng/mL
Midazolam: NOT DETECTED ng/mg creat
Norbuprenorphine: 22 ng/mg creat
Norfentanyl: NOT DETECTED ng/mg creat
Oxazepam: NOT DETECTED ng/mg creat
PHENCYCLIDINE IA: NEGATIVE ng/mL
TAPENTADOL, IA: NEGATIVE ng/mL
TRAMADOL IA: NEGATIVE ng/mL
Temazepam: NOT DETECTED ng/mg creat

## 2023-03-03 LAB — OPIATE CLASS, MS, UR RFX
Codeine: NOT DETECTED ng/mg creat
Dihydrocodeine: NOT DETECTED ng/mg creat
Hydrocodone: NOT DETECTED ng/mg creat
Hydromorphone: NOT DETECTED ng/mg creat
Morphine: NOT DETECTED ng/mg creat
Norcodeine: NOT DETECTED ng/mg creat
Norhydrocodone: NOT DETECTED ng/mg creat
Normorphine: NOT DETECTED ng/mg creat
Opiate Class Confirmation: NEGATIVE

## 2023-03-03 LAB — CANNABINOIDS, MS, UR RFX
Cannabinoids Confirmation: POSITIVE
Carboxy-THC: 39 ng/mg creat

## 2023-03-05 ENCOUNTER — Other Ambulatory Visit: Payer: Medicaid Other

## 2023-03-05 ENCOUNTER — Other Ambulatory Visit: Payer: Self-pay

## 2023-03-05 ENCOUNTER — Telehealth: Payer: Self-pay | Admitting: Lactation Services

## 2023-03-05 NOTE — Telephone Encounter (Signed)
Called patient to inform her of recent vaginal swab showing Trich, BV and yeast. Patient did not answer. Asked her to check her My Chart message and to call the office with further questions or concerns. Medications were previously sent to her Pharmacy.

## 2023-03-06 ENCOUNTER — Ambulatory Visit (HOSPITAL_BASED_OUTPATIENT_CLINIC_OR_DEPARTMENT_OTHER): Payer: Medicaid Other

## 2023-03-06 ENCOUNTER — Ambulatory Visit: Payer: Medicaid Other | Attending: Obstetrics and Gynecology

## 2023-03-06 ENCOUNTER — Other Ambulatory Visit: Payer: Self-pay | Admitting: Maternal & Fetal Medicine

## 2023-03-06 ENCOUNTER — Other Ambulatory Visit: Payer: Self-pay

## 2023-03-06 VITALS — BP 123/62 | HR 107

## 2023-03-06 DIAGNOSIS — O09293 Supervision of pregnancy with other poor reproductive or obstetric history, third trimester: Secondary | ICD-10-CM | POA: Diagnosis not present

## 2023-03-06 DIAGNOSIS — O365930 Maternal care for other known or suspected poor fetal growth, third trimester, not applicable or unspecified: Secondary | ICD-10-CM | POA: Insufficient documentation

## 2023-03-06 DIAGNOSIS — O09213 Supervision of pregnancy with history of pre-term labor, third trimester: Secondary | ICD-10-CM | POA: Diagnosis not present

## 2023-03-06 DIAGNOSIS — O09899 Supervision of other high risk pregnancies, unspecified trimester: Secondary | ICD-10-CM

## 2023-03-06 DIAGNOSIS — O99323 Drug use complicating pregnancy, third trimester: Secondary | ICD-10-CM | POA: Diagnosis not present

## 2023-03-06 DIAGNOSIS — O365920 Maternal care for other known or suspected poor fetal growth, second trimester, not applicable or unspecified: Secondary | ICD-10-CM

## 2023-03-06 DIAGNOSIS — O09893 Supervision of other high risk pregnancies, third trimester: Secondary | ICD-10-CM

## 2023-03-06 DIAGNOSIS — F1120 Opioid dependence, uncomplicated: Secondary | ICD-10-CM | POA: Diagnosis not present

## 2023-03-06 DIAGNOSIS — F1290 Cannabis use, unspecified, uncomplicated: Secondary | ICD-10-CM

## 2023-03-06 DIAGNOSIS — O99333 Smoking (tobacco) complicating pregnancy, third trimester: Secondary | ICD-10-CM | POA: Diagnosis not present

## 2023-03-06 DIAGNOSIS — Z3A31 31 weeks gestation of pregnancy: Secondary | ICD-10-CM | POA: Diagnosis not present

## 2023-03-06 DIAGNOSIS — O99213 Obesity complicating pregnancy, third trimester: Secondary | ICD-10-CM | POA: Insufficient documentation

## 2023-03-06 DIAGNOSIS — F1990 Other psychoactive substance use, unspecified, uncomplicated: Secondary | ICD-10-CM

## 2023-03-06 DIAGNOSIS — O99320 Drug use complicating pregnancy, unspecified trimester: Secondary | ICD-10-CM

## 2023-03-10 MED ORDER — BUPRENORPHINE HCL-NALOXONE HCL 8-2 MG SL FILM
1.0000 | ORAL_FILM | Freq: Two times a day (BID) | SUBLINGUAL | 0 refills | Status: AC
Start: 2023-03-10 — End: 2023-03-17

## 2023-03-10 NOTE — Addendum Note (Signed)
Addended by: Merian Capron on: 03/10/2023 02:30 PM   Modules accepted: Orders

## 2023-03-13 ENCOUNTER — Encounter: Payer: Self-pay | Admitting: Family Medicine

## 2023-03-13 ENCOUNTER — Ambulatory Visit: Payer: Medicaid Other | Admitting: *Deleted

## 2023-03-13 ENCOUNTER — Ambulatory Visit: Payer: Medicaid Other | Attending: Obstetrics

## 2023-03-13 VITALS — BP 113/73 | HR 82

## 2023-03-13 DIAGNOSIS — O09893 Supervision of other high risk pregnancies, third trimester: Secondary | ICD-10-CM | POA: Insufficient documentation

## 2023-03-13 DIAGNOSIS — O99323 Drug use complicating pregnancy, third trimester: Secondary | ICD-10-CM | POA: Diagnosis not present

## 2023-03-13 DIAGNOSIS — O365930 Maternal care for other known or suspected poor fetal growth, third trimester, not applicable or unspecified: Secondary | ICD-10-CM | POA: Diagnosis not present

## 2023-03-13 DIAGNOSIS — O09899 Supervision of other high risk pregnancies, unspecified trimester: Secondary | ICD-10-CM | POA: Insufficient documentation

## 2023-03-13 DIAGNOSIS — Z3A32 32 weeks gestation of pregnancy: Secondary | ICD-10-CM | POA: Diagnosis not present

## 2023-03-13 DIAGNOSIS — F1990 Other psychoactive substance use, unspecified, uncomplicated: Secondary | ICD-10-CM | POA: Diagnosis not present

## 2023-03-13 DIAGNOSIS — O99333 Smoking (tobacco) complicating pregnancy, third trimester: Secondary | ICD-10-CM | POA: Diagnosis not present

## 2023-03-13 DIAGNOSIS — F17210 Nicotine dependence, cigarettes, uncomplicated: Secondary | ICD-10-CM

## 2023-03-13 DIAGNOSIS — O09293 Supervision of pregnancy with other poor reproductive or obstetric history, third trimester: Secondary | ICD-10-CM

## 2023-03-13 DIAGNOSIS — O09213 Supervision of pregnancy with history of pre-term labor, third trimester: Secondary | ICD-10-CM | POA: Diagnosis not present

## 2023-03-20 ENCOUNTER — Other Ambulatory Visit: Payer: Medicaid Other

## 2023-03-20 ENCOUNTER — Ambulatory Visit: Payer: Medicaid Other

## 2023-03-21 ENCOUNTER — Ambulatory Visit: Payer: Medicaid Other | Admitting: *Deleted

## 2023-03-21 ENCOUNTER — Ambulatory Visit: Payer: Medicaid Other | Attending: Obstetrics

## 2023-03-21 VITALS — BP 118/75 | HR 100

## 2023-03-21 DIAGNOSIS — F1190 Opioid use, unspecified, uncomplicated: Secondary | ICD-10-CM

## 2023-03-21 DIAGNOSIS — F1290 Cannabis use, unspecified, uncomplicated: Secondary | ICD-10-CM

## 2023-03-21 DIAGNOSIS — O99333 Smoking (tobacco) complicating pregnancy, third trimester: Secondary | ICD-10-CM | POA: Diagnosis not present

## 2023-03-21 DIAGNOSIS — O99323 Drug use complicating pregnancy, third trimester: Secondary | ICD-10-CM | POA: Diagnosis not present

## 2023-03-21 DIAGNOSIS — O365930 Maternal care for other known or suspected poor fetal growth, third trimester, not applicable or unspecified: Secondary | ICD-10-CM

## 2023-03-21 DIAGNOSIS — O09213 Supervision of pregnancy with history of pre-term labor, third trimester: Secondary | ICD-10-CM

## 2023-03-21 DIAGNOSIS — F1990 Other psychoactive substance use, unspecified, uncomplicated: Secondary | ICD-10-CM

## 2023-03-21 DIAGNOSIS — O09899 Supervision of other high risk pregnancies, unspecified trimester: Secondary | ICD-10-CM

## 2023-03-21 DIAGNOSIS — O09893 Supervision of other high risk pregnancies, third trimester: Secondary | ICD-10-CM

## 2023-03-21 DIAGNOSIS — O09293 Supervision of pregnancy with other poor reproductive or obstetric history, third trimester: Secondary | ICD-10-CM

## 2023-03-21 DIAGNOSIS — Z3A33 33 weeks gestation of pregnancy: Secondary | ICD-10-CM

## 2023-03-24 ENCOUNTER — Encounter: Payer: Self-pay | Admitting: *Deleted

## 2023-03-24 DIAGNOSIS — O0990 Supervision of high risk pregnancy, unspecified, unspecified trimester: Secondary | ICD-10-CM | POA: Insufficient documentation

## 2023-03-26 ENCOUNTER — Inpatient Hospital Stay (HOSPITAL_BASED_OUTPATIENT_CLINIC_OR_DEPARTMENT_OTHER): Payer: Medicaid Other

## 2023-03-26 ENCOUNTER — Ambulatory Visit: Payer: Medicaid Other

## 2023-03-26 ENCOUNTER — Other Ambulatory Visit (HOSPITAL_COMMUNITY): Payer: Self-pay

## 2023-03-26 ENCOUNTER — Encounter (HOSPITAL_COMMUNITY): Payer: Self-pay | Admitting: Obstetrics and Gynecology

## 2023-03-26 ENCOUNTER — Inpatient Hospital Stay (HOSPITAL_COMMUNITY)
Admission: AD | Admit: 2023-03-26 | Discharge: 2023-03-26 | Disposition: A | Discharge status: Home / Self Care | Payer: Medicaid Other | Attending: Obstetrics and Gynecology | Admitting: Obstetrics and Gynecology

## 2023-03-26 DIAGNOSIS — Z3A33 33 weeks gestation of pregnancy: Secondary | ICD-10-CM | POA: Diagnosis not present

## 2023-03-26 DIAGNOSIS — O09293 Supervision of pregnancy with other poor reproductive or obstetric history, third trimester: Secondary | ICD-10-CM

## 2023-03-26 DIAGNOSIS — F1290 Cannabis use, unspecified, uncomplicated: Secondary | ICD-10-CM | POA: Diagnosis not present

## 2023-03-26 DIAGNOSIS — O99333 Smoking (tobacco) complicating pregnancy, third trimester: Secondary | ICD-10-CM

## 2023-03-26 DIAGNOSIS — O09213 Supervision of pregnancy with history of pre-term labor, third trimester: Secondary | ICD-10-CM

## 2023-03-26 DIAGNOSIS — O99323 Drug use complicating pregnancy, third trimester: Secondary | ICD-10-CM | POA: Diagnosis not present

## 2023-03-26 DIAGNOSIS — F1190 Opioid use, unspecified, uncomplicated: Secondary | ICD-10-CM

## 2023-03-26 DIAGNOSIS — F1120 Opioid dependence, uncomplicated: Secondary | ICD-10-CM | POA: Insufficient documentation

## 2023-03-26 DIAGNOSIS — O365930 Maternal care for other known or suspected poor fetal growth, third trimester, not applicable or unspecified: Secondary | ICD-10-CM | POA: Diagnosis not present

## 2023-03-26 DIAGNOSIS — O0933 Supervision of pregnancy with insufficient antenatal care, third trimester: Secondary | ICD-10-CM | POA: Diagnosis present

## 2023-03-26 LAB — URINALYSIS, ROUTINE W REFLEX MICROSCOPIC
Bilirubin Urine: NEGATIVE
Glucose, UA: NEGATIVE mg/dL
Hgb urine dipstick: NEGATIVE
Ketones, ur: 80 mg/dL — AB
Nitrite: NEGATIVE
Protein, ur: 30 mg/dL — AB
Specific Gravity, Urine: 1.023 (ref 1.005–1.030)
pH: 6 (ref 5.0–8.0)

## 2023-03-26 MED ORDER — BUPRENORPHINE HCL-NALOXONE HCL 8-2 MG SL SUBL
SUBLINGUAL_TABLET | Freq: Three times a day (TID) | SUBLINGUAL | 0 refills | Status: DC
  Filled 2023-03-26: qty 32, 11d supply, fill #0

## 2023-03-26 MED ORDER — BUPRENORPHINE HCL-NALOXONE HCL 8-2 MG SL SUBL
1.0000 | SUBLINGUAL_TABLET | Freq: Once | SUBLINGUAL | Status: AC
  Administered 2023-03-26: 1 via SUBLINGUAL
  Filled 2023-03-26: qty 1

## 2023-03-26 NOTE — MAU Note (Signed)
Patient called out saying that she had obtained access to her Medicaid number. RN called [facility] pharmacy to give them the number and they informed RN that they had all of the information that they needed at this time.

## 2023-03-26 NOTE — MAU Note (Signed)
.  Sandra Matthews is a 27 y.o. at [redacted]w[redacted]d here in MAU reporting: feeling like she is having withdrawals. Having diarrhea and lower back pain. C/o vag discharge clear . Since last weekend. Good fetal movement felt.  LMP:  Onset of complaint: this week Pain score: 10 Vitals:   03/26/23 1219  BP: 111/72  Pulse: (!) 101  Resp: 18  Temp: 98 F (36.7 C)     FHT:144 Lab orders placed from triage:

## 2023-03-26 NOTE — MAU Provider Note (Signed)
History     [id number]  Arrival date and time: 03/26/23 1137    No chief complaint on file.    HPI Sandra Matthews is a 27 y.o. at [redacted]w[redacted]d with PMHx notable for currently active OUD, IUGR in this pregnancy, who presents for OUD.   Patient well known to me from clinic Last saw her in clinic on 02/04/23, had not been seen for three months at that time and had run out of her suboxone and was using illicits. We had a frank conversation about the course of her pregnancy from a social perspective and the negative impact this would have after delivery Toxassure from that visit showed large amounts of oxycodone metabolites and minimal buprenorphine Seen 02/11/23 for viral gastro in MAU No showed visit with me on 02/18/23 Saw Dr. Nobie Putnam on 02/27/23, ongoing issues with adherence to suboxone and illicit use Toxassure from that visit showed similar findings to last one with large amounts of oxycodone metabolites and minimal buprenorphine No showed visit with me on 03/13/23 During this time has had borderline growth of 10-11% on anatomy scans with normal dopplers, she has been consistent with MFM visits on a weekly basis in the past month  Today reports she has been using oxycodone pills to get by Last used two days ago and is feeling sick from withdrawal symptoms Has used suboxone inconsistently when she has had a prescription Also having transportation difficulty as main barrier to getting to appointments with me She denies any vaginal bleeding Having some discharge but does not think her water has broken as she has had that before and this feels different Endorses normal fetal movement Some occasional braxton hicks contractions   O/Positive/-- (01/24 1204)  OB History     Gravida  5   Para  4   Term  3   Preterm  1   AB  0   Living  4      SAB  0   IAB  0   Ectopic  0   Multiple      Live Births  4           Past Medical History:  Diagnosis Date   Anemia     Anxiety and depression 02/04/2023   Depression 09/27/2022   PP stuff, was thinking of calling and talking with someone   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   + THC 01/31/2020   Preterm labor    Supervision of other high risk pregnancy, antepartum 11/13/2022          Nursing Staff  Provider  Office Location  [facility]  Dating   05/08/2023, by Last Menstrual Period  [facility] Model  [X]  Traditional  [ ]  Centering  [ ]  Mom-Baby Dyad        Language   English  Anatomy US   Normal>IUGR  Flu Vaccine      Genetic/Carrier Screen   NIPS:   low risk female  AFP:   not done  Horizon: neg 14/14  TDaP Vaccine    Deferred on 02/04/23 (would like at next visit)   Hgb A1C or   GTT    Past Surgical History:  Procedure Laterality Date   FRACTURE SURGERY     broken arm in childhood, cast only, no surgery    Family History  Problem Relation Age of Onset   Diabetes Mother    Healthy Mother    Bipolar disorder Father    Depression Father    Anxiety disorder Father  Arthritis Father    Diabetes Maternal Grandmother    Hypertension Maternal Grandfather    Depression Paternal Grandmother     Social History   Socioeconomic History   Marital status: Single    Spouse name: Not on file   Number of children: 3   Years of education: Not on file   Highest education level: Not on file  Occupational History   Occupation: UNEMPLOYED  Tobacco Use   Smoking status: Every Day    Packs/day: 0.50    Years: 2.00    Additional pack years: 0.00    Total pack years: 1.00    Types: Cigarettes   Smokeless tobacco: Never  Vaping Use   Vaping Use: Former  Substance and Sexual Activity   Alcohol use: No   Drug use: Yes    Comment: Percocet on Tuesday   Sexual activity: Yes    Birth control/protection: None  Other Topics Concern   Not on file  Social History Narrative   ** Merged History Encounter **       Social Determinants of Health   Financial Resource Strain: Not on file  Food Insecurity: Not on file   Transportation Needs: Not on file  Physical Activity: Not on file  Stress: Not on file  Social Connections: Not on file  Intimate Partner Violence: Not on file    No Known Allergies  No current facility-administered medications on file prior to encounter.   Current Outpatient Medications on File Prior to Encounter  Medication Sig Dispense Refill   buprenorphine-naloxone (SUBOXONE) 8-2 mg SUBL SL tablet Place 1 tablet under the tongue daily.     ondansetron (ZOFRAN ODT) 4 MG disintegrating tablet Take 1 tablet (4 mg total) by mouth every 6 (six) hours as needed for nausea. 20 tablet 1   Prenatal Vit-Fe Phos-FA-Omega (VITAFOL GUMMIES) 3.33-0.333-34.8 MG CHEW Chew 1 tablet by mouth daily. 90 tablet 5   promethazine (PHENERGAN) 25 MG tablet Take 1 tablet (25 mg total) by mouth every 6 (six) hours as needed for nausea or vomiting. 30 tablet 0   acetaminophen (TYLENOL) 500 MG tablet Take 1,000 mg by mouth every 6 (six) hours as needed. (Patient not taking: Reported on 02/04/2023)     docusate sodium (COLACE) 100 MG capsule Take 1 capsule (100 mg total) by mouth 2 (two) times daily. Then decrease to once daily or as needed. (Patient not taking: Reported on 02/27/2023) 60 capsule 2   Doxylamine-Pyridoxine (DICLEGIS) 10-10 MG TBEC Take 2 tabs qhs then add 1 tab A.M and midday prn (Patient not taking: Reported on 02/04/2023) 90 tablet 0   loperamide (IMODIUM) 2 MG capsule Take 1 capsule (2 mg total) by mouth as needed for diarrhea or loose stools. (Patient not taking: Reported on 02/04/2023) 30 capsule 0   metroNIDAZOLE (FLAGYL) 500 MG tablet Take 1 tablet (500 mg total) by mouth 2 (two) times daily. 14 tablet 0   naloxone (NARCAN) nasal spray 4 mg/0.1 mL Place 1 spray into the nose once as needed for up to 1 dose (overdose). (Patient not taking: Reported on 03/06/2023) 1 each 0     ROS Pertinent positives and negative per HPI, all others reviewed and negative  Physical Exam   BP 108/71   Pulse 94    Temp 98 F (36.7 C)   Resp 18   Ht 5\' 1"  (1.549 m)   Wt 56.7 kg   LMP 08/01/2022   BMI 23.62 kg/m   Patient Vitals for the past 24  hrs:  BP Temp Pulse Resp Height Weight  03/26/23 1241 108/71 -- 94 -- -- --  03/26/23 1219 111/72 98 F (36.7 C) (!) 101 18 5\' 1"  (1.549 m) 56.7 kg    Physical Exam Vitals reviewed.  Constitutional:      General: She is not in acute distress.    Appearance: She is well-developed. She is not diaphoretic.  Eyes:     General: No scleral icterus. Pulmonary:     Effort: Pulmonary effort is normal. No respiratory distress.  Abdominal:     General: There is no distension.     Palpations: Abdomen is soft.     Tenderness: There is no abdominal tenderness. There is no guarding or rebound.  Skin:    General: Skin is warm and dry.  Neurological:     Mental Status: She is alert.     Coordination: Coordination normal.      Cervical Exam    Bedside Ultrasound Not performed.  My interpretation: n/a  FHT Baseline: 145 bpm Variability: Good {> 6 bpm) Accelerations: Reactive Decelerations: Absent Uterine activity: rare Cat: I  Labs Results for orders placed or performed during the hospital encounter of 03/26/23 (from the past 24 hour(s))  Urinalysis, Routine w reflex microscopic -Urine, Clean Catch     Status: Abnormal   Collection Time: 03/26/23 12:36 PM  Result Value Ref Range   Color, Urine AMBER (A) YELLOW   APPearance CLOUDY (A) CLEAR   Specific Gravity, Urine 1.023 1.005 - 1.030   pH 6.0 5.0 - 8.0   Glucose, UA NEGATIVE NEGATIVE mg/dL   Hgb urine dipstick NEGATIVE NEGATIVE   Bilirubin Urine NEGATIVE NEGATIVE   Ketones, ur 80 (A) NEGATIVE mg/dL   Protein, ur 30 (A) NEGATIVE mg/dL   Nitrite NEGATIVE NEGATIVE   Leukocytes,Ua TRACE (A) NEGATIVE   RBC / HPF 0-5 0 - 5 RBC/hpf   WBC, UA 6-10 0 - 5 WBC/hpf   Bacteria, UA FEW (A) NONE SEEN   Squamous Epithelial / HPF 21-50 0 - 5 /HPF   Mucus PRESENT    Non Squamous Epithelial 0-5 (A)  NONE SEEN    Imaging [place] MFM FETAL BPP WO NON STRESS  Result Date: 03/26/2023 ----------------------------------------------------------------------  OBSTETRICS REPORT                       (Signed Final 03/26/2023 02:43 pm) ---------------------------------------------------------------------- Patient Info  ID #:       [id number]                          D.O.B.:  01-23-1996 (27 yrs)  Name:       [name] I                      Visit Date: 03/26/2023 02:28 pm              [place] ---------------------------------------------------------------------- Performed By  Attending:        Noralee Space MD        Ref. Address:     [address]  Performed By:     Marcellina Millin          Secondary Phy.:   [facility] MAU/Triage                    RDMS  Referred By:      Mary Sella  [facility]                    Rimsha Trembley MD                               [facility] ---------------------------------------------------------------------- Orders  #  Description                           Code        Ordered By  1  [place] MFM FETAL BPP WO NON               76819.01    Kewan Mcnease     STRESS  2  [place] MFM OB FOLLOW UP                   76816.01    Elizabet Schweppe  3  [place] MFM UA CORD DOPPLER                76820.02    Eiley Mcginnity ----------------------------------------------------------------------  #  Order #                     Accession #                Episode #  1  [id number]                   [id number]                 [id number]  2  [id number]                   [id number]                 [id number]  3  [id number]                   [id number]                 [id number] ---------------------------------------------------------------------- Indications  Maternal care for known or suspected poor      O36.5930  fetal growth, third trimester, not applicable or  unspecified IUGR  [redacted] weeks gestation of pregnancy                Z3A.33  Suboxone use                                   O99.320  Drug use complicating  pregnancy, third         O99.323  trimester (THC)  Tobacco use complicating pregnancy             O99.330  Poor obstetric history: Previous preterm       O09.219  delivery, antepartum (33w, 3x term since)  Poor obstetric history: Previous fetal growth  O09.299  restriction (FGR) ---------------------------------------------------------------------- Fetal Evaluation  Num Of Fetuses:         1  Fetal Heart Rate(bpm):  151  Cardiac Activity:       Observed  Presentation:           Cephalic  Placenta:               Posterior  P. Cord Insertion:      Previously visualized  Amniotic Fluid  AFI  FV:      Within normal limits  AFI Sum(cm)     %Tile       Largest Pocket(cm)  12.2            35          4.  RUQ(cm)       RLQ(cm)       LUQ(cm)        LLQ(cm)  4             1.3           2.9            4 ---------------------------------------------------------------------- Biophysical Evaluation  Amniotic F.V:   Within normal limits       F. Tone:        Observed  F. Movement:    Not Observed               Score:          6/8  F. Breathing:   Observed ---------------------------------------------------------------------- Biometry  BPD:      83.4  mm     G. Age:  33w 4d         38  %    CI:        78.38   %    70 - 86                                                          FL/HC:      21.7   %    19.4 - 21.8  HC:       298   mm     G. Age:  33w 0d        4.9  %    HC/AC:      1.12        0.96 - 1.11  AC:      267.1  mm     G. Age:  30w 6d        1.1  %    FL/BPD:     77.6   %    71 - 87  FL:       64.7  mm     G. Age:  33w 3d         27  %    FL/AC:      24.2   %    20 - 24  HUM:      58.2  mm     G. Age:  33w 5d         55  %  Est. FW:    1894  gm      4 lb 3 oz      6  % ---------------------------------------------------------------------- OB History  Gravidity:    5         Term:   3        Prem:   1        SAB:   0  TOP:          0       Ectopic:  0        Living: 4  ---------------------------------------------------------------------- Gestational Age  LMP:           33w 6d  Date:  08/01/22                 EDD:   05/08/23  U/S Today:     32w 5d                                        EDD:   05/16/23  Best:          33w 6d     Det. By:  LMP  (08/01/22)          EDD:   05/08/23 ---------------------------------------------------------------------- Anatomy  Cranium:               Previously seen        LVOT:                   Previously seen  Cavum:                 Previously seen        Aortic Arch:            Previously seen  Ventricles:            Appears normal         Ductal Arch:            Previously seen  Choroid Plexus:        Previously seen        Diaphragm:              Appears normal  Cerebellum:            Previously seen        Stomach:                Appears normal, left                                                                        sided  Posterior Fossa:       Previously seen        Abdomen:                Previously seen  Nuchal Fold:           Previously seen        Abdominal Wall:         Previously seen  Face:                  Orbits and profile     Cord Vessels:           Previously seen                         previously seen  Lips:                  Previously seen        Kidneys:                Appear normal  Palate:                Previously seen        Bladder:  Appears normal  Thoracic:              Appears normal         Spine:                  Previously seen  Heart:                 Appears normal         Upper Extremities:      Previously seen                         (4CH, axis, and                         situs)  RVOT:                  Previously seen        Lower Extremities:      Previously seen  Other:  Female gender, Heels/feet, open hands/5th digits, Nasal bone, lenses,          maxilla, mandible and falx, VC, 3VV, and 3VTV previously visualized.          Technically difficult due to advanced GA and fetal position.  ---------------------------------------------------------------------- Doppler - Fetal Vessels  Umbilical Artery   S/D     %tile      RI    %tile      PI    %tile     PSV    ADFV    RDFV                                                     (cm/s)    2.9       71     0.7       90       1       76     40.8      No      No ---------------------------------------------------------------------- Cervix Uterus Adnexa  Cervix  Not visualized (advanced GA >24wks)  Uterus  No abnormality visualized.  Adnexa  No abnormality visualized ---------------------------------------------------------------------- Impression  Patient is being evaluated at the MAU for opioid use disorder.  On ultrasound performed 3 weeks ago, the estimated fetal  weight was at the 11th percentile and the abdominal  circumference measurement was at the 5th percentile.  On today's ultrasound, the estimated fetal weight is at the 6  percentile and the abdominal circumference measurement is  at the 1st percentile.  Amniotic fluid is normal and good fetal  activity seen.  Cephalic presentation.  Fetal breathing  movements did not meet the criteria of BPP.  Umbilical artery  Doppler showed normal forward diastolic flow.  I reviewed the NST performed at MAU and it is reactive.  BPP  score 8/10.  Based on the abdominal circumference measurement, she  has severe fetal growth restriction.  I recommend delivery at [redacted] weeks gestation.  I discussed  with Dr. Crissie Reese. ---------------------------------------------------------------------- Recommendations  -BPP, UA Doppler and NST next week.  -Delivery at 37 weeks' gestation. ----------------------------------------------------------------------                 Noralee Space, MD Electronically Signed Final Report   03/26/2023 02:43 pm ----------------------------------------------------------------------  [place]  MFM UA Cord Doppler  Result Date:  03/26/2023 ----------------------------------------------------------------------  OBSTETRICS REPORT                       (Signed Final 03/26/2023 02:43 pm) ---------------------------------------------------------------------- Patient Info  ID #:       [id number]                          D.O.B.:  [birth year] (27 yrs)  Name:       Marcello Moores                      Visit Date: 03/26/2023 02:28 pm              [place] ---------------------------------------------------------------------- Performed By  Attending:        Noralee Space MD        Ref. Address:     [address]  Performed By:     Marcellina Millin          Secondary Phy.:   [facility] MAU/Triage                    RDMS  Referred By:      Mary Sella              [facility]                    Arda Keadle MD                               [facility] ---------------------------------------------------------------------- Orders  #  Description                           Code        Ordered By  1  [place] MFM FETAL BPP WO NON               76819.01    Daviana Haymaker     STRESS  2  [place] MFM OB FOLLOW UP                   76816.01    Beena Catano  3  [place] MFM UA CORD DOPPLER                76820.02    Janayla Marik ----------------------------------------------------------------------  #  Order #                     Accession #                Episode #  1  [id number]                   [id number]                 [id number]  2  [id number]                   [id number]  3  [id number]                   [id number]                 [id number] ---------------------------------------------------------------------- Indications  Maternal care for known or suspected poor      O36.5930  fetal growth, third trimester, not applicable or  unspecified IUGR  [redacted] weeks gestation of pregnancy                Z3A.33  Suboxone use                                   O99.320  Drug use complicating pregnancy, third         O99.323  trimester (THC)  Tobacco use  complicating pregnancy             O99.330  Poor obstetric history: Previous preterm       O09.219  delivery, antepartum (33w, 3x term since)  Poor obstetric history: Previous fetal growth  O09.299  restriction (FGR) ---------------------------------------------------------------------- Fetal Evaluation  Num Of Fetuses:         1  Fetal Heart Rate(bpm):  151  Cardiac Activity:       Observed  Presentation:           Cephalic  Placenta:               Posterior  P. Cord Insertion:      Previously visualized  Amniotic Fluid  AFI FV:      Within normal limits  AFI Sum(cm)     %Tile       Largest Pocket(cm)  12.2            35          4.  RUQ(cm)       RLQ(cm)       LUQ(cm)        LLQ(cm)  4             1.3           2.9            4 ---------------------------------------------------------------------- Biophysical Evaluation  Amniotic F.V:   Within normal limits       F. Tone:        Observed  F. Movement:    Not Observed               Score:          6/8  F. Breathing:   Observed ---------------------------------------------------------------------- Biometry  BPD:      83.4  mm     G. Age:  33w 4d         38  %    CI:        78.38   %    70 - 86                                                          FL/HC:      21.7   %    19.4 - 21.8  HC:       298   mm     G. Age:  33w 0d        4.9  %    HC/AC:      1.12        0.96 - 1.11  AC:      267.1  mm     G.  Age:  30w 6d        1.1  %    FL/BPD:     77.6   %    71 - 87  FL:       64.7  mm     G. Age:  33w 3d         27  %    FL/AC:      24.2   %    20 - 24  HUM:      58.2  mm     G. Age:  33w 5d         55  %  Est. FW:    1894  gm      4 lb 3 oz      6  % ---------------------------------------------------------------------- OB History  Gravidity:    5         Term:   3        Prem:   1        SAB:   0  TOP:          0       Ectopic:  0        Living: 4 ---------------------------------------------------------------------- Gestational Age  LMP:           33w 6d         Date:  08/01/22                 EDD:   05/08/23  U/S Today:     32w 5d                                        EDD:   05/16/23  Best:          33w 6d     Det. By:  LMP  (08/01/22)          EDD:   05/08/23 ---------------------------------------------------------------------- Anatomy  Cranium:               Previously seen        LVOT:                   Previously seen  Cavum:                 Previously seen        Aortic Arch:            Previously seen  Ventricles:            Appears normal         Ductal Arch:            Previously seen  Choroid Plexus:        Previously seen        Diaphragm:              Appears normal  Cerebellum:            Previously seen        Stomach:                Appears normal, left  sided  Posterior Fossa:       Previously seen        Abdomen:                Previously seen  Nuchal Fold:           Previously seen        Abdominal Wall:         Previously seen  Face:                  Orbits and profile     Cord Vessels:           Previously seen                         previously seen  Lips:                  Previously seen        Kidneys:                Appear normal  Palate:                Previously seen        Bladder:                Appears normal  Thoracic:              Appears normal         Spine:                  Previously seen  Heart:                 Appears normal         Upper Extremities:      Previously seen                         (4CH, axis, and                         situs)  RVOT:                  Previously seen        Lower Extremities:      Previously seen  Other:  Female gender, Heels/feet, open hands/5th digits, Nasal bone, lenses,          maxilla, mandible and falx, VC, 3VV, and 3VTV previously visualized.          Technically difficult due to advanced GA and fetal position. ---------------------------------------------------------------------- Doppler - Fetal Vessels  Umbilical Artery   S/D      %tile      RI    %tile      PI    %tile     PSV    ADFV    RDFV                                                     (cm/s)    2.9       71     0.7       90       1       76     40.8      No  No ---------------------------------------------------------------------- Cervix Uterus Adnexa  Cervix  Not visualized (advanced GA >24wks)  Uterus  No abnormality visualized.  Adnexa  No abnormality visualized ---------------------------------------------------------------------- Impression  Patient is being evaluated at the MAU for opioid use disorder.  On ultrasound performed 3 weeks ago, the estimated fetal  weight was at the 11th percentile and the abdominal  circumference measurement was at the 5th percentile.  On today's ultrasound, the estimated fetal weight is at the 6  percentile and the abdominal circumference measurement is  at the 1st percentile.  Amniotic fluid is normal and good fetal  activity seen.  Cephalic presentation.  Fetal breathing  movements did not meet the criteria of BPP.  Umbilical artery  Doppler showed normal forward diastolic flow.  I reviewed the NST performed at MAU and it is reactive.  BPP  score 8/10.  Based on the abdominal circumference measurement, she  has severe fetal growth restriction.  I recommend delivery at [redacted] weeks gestation.  I discussed  with Dr. Crissie Reese. ---------------------------------------------------------------------- Recommendations  -BPP, UA Doppler and NST next week.  -Delivery at 37 weeks' gestation. ----------------------------------------------------------------------                 Noralee Space, MD Electronically Signed Final Report   03/26/2023 02:43 pm ----------------------------------------------------------------------  [place] MFM OB FOLLOW UP  Result Date: 03/26/2023 ----------------------------------------------------------------------  OBSTETRICS REPORT                       (Signed Final 03/26/2023 02:43 pm)  ---------------------------------------------------------------------- Patient Info  ID #:       [id number]                          D.O.B.:  1996-03-25 (27 yrs)  Name:       Marcello Moores                      Visit Date: 03/26/2023 02:28 pm              [place] ---------------------------------------------------------------------- Performed By  Attending:        Noralee Space MD        Ref. Address:     [address]  Performed By:     Marcellina Millin          Secondary Phy.:   [facility] MAU/Triage                    RDMS  Referred By:      Mary Sella              [facility]                    Manual Navarra MD                               [facility] ---------------------------------------------------------------------- Orders  #  Description                           Code        Ordered By  1  [place] MFM FETAL BPP WO NON               78[address].62    Janai Brannigan     STRESS  2  [place] MFM OB  FOLLOW UP                   [id number]    Minha Fulco  3  [place] MFM UA CORD DOPPLER                76820.02    Maura Braaten ----------------------------------------------------------------------  #  Order #                     Accession #                Episode #  1  [id number]                   [id number]                 [id number]  2  [id number]                   [id number]  3  [id number]                   [id number]                 [id number] ---------------------------------------------------------------------- Indications  Maternal care for known or suspected poor      O36.5930  fetal growth, third trimester, not applicable or  unspecified IUGR  [redacted] weeks gestation of pregnancy                Z3A.33  Suboxone use                                   O99.320  Drug use complicating pregnancy, third         O99.323  trimester (THC)  Tobacco use complicating pregnancy             O99.330  Poor obstetric history: Previous preterm       O09.219  delivery, antepartum (33w, 3x term since)  Poor obstetric  history: Previous fetal growth  O09.299  restriction (FGR) ---------------------------------------------------------------------- Fetal Evaluation  Num Of Fetuses:         1  Fetal Heart Rate(bpm):  151  Cardiac Activity:       Observed  Presentation:           Cephalic  Placenta:               Posterior  P. Cord Insertion:      Previously visualized  Amniotic Fluid  AFI FV:      Within normal limits  AFI Sum(cm)     %Tile       Largest Pocket(cm)  12.2            35          4.  RUQ(cm)       RLQ(cm)       LUQ(cm)        LLQ(cm)  4             1.3           2.9            4 ---------------------------------------------------------------------- Biophysical Evaluation  Amniotic F.V:   Within normal limits       F. Tone:        Observed  F. Movement:    Not Observed  Score:          6/8  F. Breathing:   Observed ---------------------------------------------------------------------- Biometry  BPD:      83.4  mm     G. Age:  33w 4d         38  %    CI:        78.38   %    70 - 86                                                          FL/HC:      21.7   %    19.4 - 21.8  HC:       298   mm     G. Age:  33w 0d        4.9  %    HC/AC:      1.12        0.96 - 1.11  AC:      267.1  mm     G. Age:  30w 6d        1.1  %    FL/BPD:     77.6   %    71 - 87  FL:       64.7  mm     G. Age:  33w 3d         27  %    FL/AC:      24.2   %    20 - 24  HUM:      58.2  mm     G. Age:  33w 5d         55  %  Est. FW:    1894  gm      4 lb 3 oz      6  % ---------------------------------------------------------------------- OB History  Gravidity:    5         Term:   3        Prem:   1        SAB:   0  TOP:          0       Ectopic:  0        Living: 4 ---------------------------------------------------------------------- Gestational Age  LMP:           33w 6d        Date:  08/01/22                 EDD:   05/08/23  U/S Today:     32w 5d                                        EDD:   05/16/23  Best:          33w 6d     Det. By:   LMP  (08/01/22)          EDD:   05/08/23 ---------------------------------------------------------------------- Anatomy  Cranium:               Previously seen        LVOT:  Previously seen  Cavum:                 Previously seen        Aortic Arch:            Previously seen  Ventricles:            Appears normal         Ductal Arch:            Previously seen  Choroid Plexus:        Previously seen        Diaphragm:              Appears normal  Cerebellum:            Previously seen        Stomach:                Appears normal, left                                                                        sided  Posterior Fossa:       Previously seen        Abdomen:                Previously seen  Nuchal Fold:           Previously seen        Abdominal Wall:         Previously seen  Face:                  Orbits and profile     Cord Vessels:           Previously seen                         previously seen  Lips:                  Previously seen        Kidneys:                Appear normal  Palate:                Previously seen        Bladder:                Appears normal  Thoracic:              Appears normal         Spine:                  Previously seen  Heart:                 Appears normal         Upper Extremities:      Previously seen                         (4CH, axis, and                         situs)  RVOT:  Previously seen        Lower Extremities:      Previously seen  Other:  Female gender, Heels/feet, open hands/5th digits, Nasal bone, lenses,          maxilla, mandible and falx, VC, 3VV, and 3VTV previously visualized.          Technically difficult due to advanced GA and fetal position. ---------------------------------------------------------------------- Doppler - Fetal Vessels  Umbilical Artery   S/D     %tile      RI    %tile      PI    %tile     PSV    ADFV    RDFV                                                     (cm/s)    2.9       71     0.7       90        1       76     40.8      No      No ---------------------------------------------------------------------- Cervix Uterus Adnexa  Cervix  Not visualized (advanced GA >24wks)  Uterus  No abnormality visualized.  Adnexa  No abnormality visualized ---------------------------------------------------------------------- Impression  Patient is being evaluated at the MAU for opioid use disorder.  On ultrasound performed 3 weeks ago, the estimated fetal  weight was at the 11th percentile and the abdominal  circumference measurement was at the 5th percentile.  On today's ultrasound, the estimated fetal weight is at the 6  percentile and the abdominal circumference measurement is  at the 1st percentile.  Amniotic fluid is normal and good fetal  activity seen.  Cephalic presentation.  Fetal breathing  movements did not meet the criteria of BPP.  Umbilical artery  Doppler showed normal forward diastolic flow.  I reviewed the NST performed at MAU and it is reactive.  BPP  score 8/10.  Based on the abdominal circumference measurement, she  has severe fetal growth restriction.  I recommend delivery at [redacted] weeks gestation.  I discussed  with Dr. Crissie Reese. ---------------------------------------------------------------------- Recommendations  -BPP, UA Doppler and NST next week.  -Delivery at 37 weeks' gestation. ----------------------------------------------------------------------                 Noralee Space, MD Electronically Signed Final Report   03/26/2023 02:43 pm ----------------------------------------------------------------------   MAU Course  Procedures Lab Orders         Urinalysis, Routine w reflex microscopic -Urine, Clean Catch    Meds ordered this encounter  Medications   buprenorphine-naloxone (SUBOXONE) 8-2 mg per SL tablet 1 tablet   Buprenorphine HCl-Naloxone HCl 8-2 MG FILM    Sig: Place 1 Film under the tongue 3 (three) times daily for 14 days.    Dispense:  42 Film    Refill:  0   Imaging Orders          [place] MFM FETAL BPP WO NON STRESS         [place] MFM UA Cord Doppler         [place] MFM OB FOLLOW UP      MDM Moderate (Level 3-4)  Assessment and Plan  #Opioid use disorder, moderate #[redacted] weeks gestation of pregnancy #Scant prenatal care Patient given suboxone  8 mg with massive improvement in her symptoms. Discussed importance of taking medication daily and not PRN to provide consistent control of withdrawal/craving symptoms as well as protect her from overdose. Discussed that this does not reflect on her in any way that she needs daily medication, just that she has a medical condition that requires treatment. Also discussed that she has a free ride benefit through her medicaid and needs to call them at least 2 business days in advance to schedule rides. Meds to beds consult placed and patient was given 2 wk rx of suboxone, I also coordinated clinic follow up to coincide with her next MFM visit on 04/03/23.   #IUGR Missed appt today for cord dopplers BPP. In addition patient is due for growth [place] so this was performed today as well and showed BPP 8/10 with normal dopplers but now baby with severe IUGR EFW 6% and AC 1%. I reviewed with her recommendation for delivery at 37 weeks, we will schedule this at her upcoming follow up visit.   #FWB FHT Cat I NST: Reactive   Dispo: discharged to home in stable condition    Venora Maples, MD/MPH 03/26/23 3:47 PM  Allergies as of 03/26/2023   No Known Allergies      Medication List     STOP taking these medications    buprenorphine-naloxone 8-2 mg Subl SL tablet Commonly known as: SUBOXONE Replaced by: Buprenorphine HCl-Naloxone HCl 8-2 MG Film   docusate sodium 100 MG capsule Commonly known as: COLACE   Doxylamine-Pyridoxine 10-10 MG Tbec Commonly known as: Diclegis   loperamide 2 MG capsule Commonly known as: IMODIUM   metroNIDAZOLE 500 MG tablet Commonly known as: FLAGYL       TAKE these medications    acetaminophen 500 MG  tablet Commonly known as: TYLENOL Take 1,000 mg by mouth every 6 (six) hours as needed.   Buprenorphine HCl-Naloxone HCl 8-2 MG Film Place 1 Film under the tongue 3 (three) times daily for 14 days. Replaces: buprenorphine-naloxone 8-2 mg Subl SL tablet   naloxone 4 MG/0.1ML Liqd nasal spray kit Commonly known as: NARCAN Place 1 spray into the nose once as needed for up to 1 dose (overdose).   ondansetron 4 MG disintegrating tablet Commonly known as: Zofran ODT Take 1 tablet (4 mg total) by mouth every 6 (six) hours as needed for nausea.   promethazine 25 MG tablet Commonly known as: PHENERGAN Take 1 tablet (25 mg total) by mouth every 6 (six) hours as needed for nausea or vomiting.   Vitafol Gummies 3.33-0.333-34.8 MG Chew Chew 1 tablet by mouth daily.

## 2023-03-26 NOTE — MAU Note (Signed)
RN received call from the pharmacy saying that they had an invalid Medicaid number and if the patient knew the number. RN asked patient if she knew the number or had a copy or picture of it and the patient said that she did not. RN asked if there was anyone else who had access to her card and could send a picture of the number and the patient said that she was locked out of her apartment today and would not be able to have access to it until tomorrow. RN informed pharmacy that patient did not know the valid Medicaid number. Pharmacy told RN that they most likely would not be able to fill the prescription at this time because they have no way to access the Medicaid number. Pharmacy said that they would reach out to social work and see what they could do. Dr. Crissie Reese updated.

## 2023-03-27 ENCOUNTER — Ambulatory Visit: Payer: Medicaid Other

## 2023-03-27 ENCOUNTER — Other Ambulatory Visit (HOSPITAL_COMMUNITY): Payer: Self-pay

## 2023-04-03 ENCOUNTER — Other Ambulatory Visit (HOSPITAL_COMMUNITY): Payer: Self-pay

## 2023-04-03 ENCOUNTER — Other Ambulatory Visit: Payer: Self-pay | Admitting: Obstetrics

## 2023-04-03 ENCOUNTER — Ambulatory Visit: Payer: Medicaid Other | Attending: Obstetrics

## 2023-04-03 ENCOUNTER — Ambulatory Visit: Payer: Medicaid Other | Admitting: *Deleted

## 2023-04-03 ENCOUNTER — Ambulatory Visit: Payer: Medicaid Other

## 2023-04-03 ENCOUNTER — Encounter: Payer: Self-pay | Admitting: Family Medicine

## 2023-04-03 ENCOUNTER — Ambulatory Visit (INDEPENDENT_AMBULATORY_CARE_PROVIDER_SITE_OTHER): Payer: Medicaid Other | Admitting: Family Medicine

## 2023-04-03 ENCOUNTER — Other Ambulatory Visit (HOSPITAL_COMMUNITY)
Admission: RE | Admit: 2023-04-03 | Discharge: 2023-04-03 | Disposition: A | Discharge status: Home / Self Care | Payer: Medicaid Other | Source: Ambulatory Visit | Attending: Family Medicine | Admitting: Family Medicine

## 2023-04-03 VITALS — BP 114/78 | HR 112 | Wt 129.4 lb

## 2023-04-03 VITALS — BP 116/66 | HR 85

## 2023-04-03 DIAGNOSIS — Z3A35 35 weeks gestation of pregnancy: Secondary | ICD-10-CM

## 2023-04-03 DIAGNOSIS — O365930 Maternal care for other known or suspected poor fetal growth, third trimester, not applicable or unspecified: Secondary | ICD-10-CM

## 2023-04-03 DIAGNOSIS — F17210 Nicotine dependence, cigarettes, uncomplicated: Secondary | ICD-10-CM

## 2023-04-03 DIAGNOSIS — A5901 Trichomonal vulvovaginitis: Secondary | ICD-10-CM | POA: Insufficient documentation

## 2023-04-03 DIAGNOSIS — O09893 Supervision of other high risk pregnancies, third trimester: Secondary | ICD-10-CM

## 2023-04-03 DIAGNOSIS — F1990 Other psychoactive substance use, unspecified, uncomplicated: Secondary | ICD-10-CM

## 2023-04-03 DIAGNOSIS — O09899 Supervision of other high risk pregnancies, unspecified trimester: Secondary | ICD-10-CM

## 2023-04-03 DIAGNOSIS — O0990 Supervision of high risk pregnancy, unspecified, unspecified trimester: Secondary | ICD-10-CM | POA: Diagnosis not present

## 2023-04-03 DIAGNOSIS — F1190 Opioid use, unspecified, uncomplicated: Secondary | ICD-10-CM | POA: Diagnosis not present

## 2023-04-03 DIAGNOSIS — O09293 Supervision of pregnancy with other poor reproductive or obstetric history, third trimester: Secondary | ICD-10-CM | POA: Diagnosis not present

## 2023-04-03 DIAGNOSIS — Z8759 Personal history of other complications of pregnancy, childbirth and the puerperium: Secondary | ICD-10-CM

## 2023-04-03 DIAGNOSIS — O09213 Supervision of pregnancy with history of pre-term labor, third trimester: Secondary | ICD-10-CM | POA: Diagnosis not present

## 2023-04-03 DIAGNOSIS — O99323 Drug use complicating pregnancy, third trimester: Secondary | ICD-10-CM | POA: Diagnosis not present

## 2023-04-03 DIAGNOSIS — O99333 Smoking (tobacco) complicating pregnancy, third trimester: Secondary | ICD-10-CM

## 2023-04-03 MED ORDER — BUPRENORPHINE HCL-NALOXONE HCL 8-2 MG SL SUBL
1.0000 | SUBLINGUAL_TABLET | Freq: Three times a day (TID) | SUBLINGUAL | 0 refills | Status: DC
Start: 2023-04-03 — End: 2023-04-19
  Filled 2023-04-03: qty 63, 21d supply, fill #0

## 2023-04-03 NOTE — Patient Instructions (Signed)

## 2023-04-03 NOTE — Procedures (Addendum)
Sandra Matthews November 22, 1995 [redacted]w[redacted]d  Fetus A Non-Stress Test Interpretation for 04/03/23  Indication: IUGR  Fetal Heart Rate A Mode: External Baseline Rate (A): 135 bpm Variability: Moderate Accelerations: 15 x 15 Decelerations: None Multiple birth?: No  Uterine Activity Mode: Toco Contraction Frequency (min): none  Interpretation (Fetal Testing) Nonstress Test Interpretation: Reactive Overall Impression: Reassuring for gestational age Comments: Tracing reviewed byDr. Darra Lis

## 2023-04-03 NOTE — Progress Notes (Signed)
   Subjective:  Sandra Matthews is a 27 y.o. [phone] at [redacted]w[redacted]d being seen today for ongoing prenatal care.  She is currently monitored for the following issues for this high-risk pregnancy and has History of preterm premature rupture of membranes (PPROM); IUGR (intrauterine growth restriction) affecting care of mother; Opioid use disorder; and Supervision of other high risk pregnancy, antepartum on their problem list.  Patient reports no complaints.  Contractions: Not present. Vag. Bleeding: None.  Movement: Present. Denies leaking of fluid.   The following portions of the patient's history were reviewed and updated as appropriate: allergies, current medications, past family history, past medical history, past social history, past surgical history and problem list. Problem list updated.  Objective:   Vitals:   04/03/23 1335  BP: 114/78  Pulse: (!) 112  Weight: 129 lb 6.4 oz (58.7 kg)    Fetal Status: Fetal Heart Rate (bpm): 137   Movement: Present  Presentation: Vertex  General:  Alert, oriented and cooperative. Patient is in no acute distress.  Skin: Skin is warm and dry. No rash noted.   Cardiovascular: Normal heart rate noted  Respiratory: Normal respiratory effort, no problems with respiration noted  Abdomen: Soft, gravid, appropriate for gestational age. Pain/Pressure: Present     Pelvic: Vag. Bleeding: None     Cervical exam performed Dilation: 2 Effacement (%): Thick Station: -2  Extremities: Normal range of motion.     Mental Status: Normal mood and affect. Normal behavior. Normal judgment and thought content.   Urinalysis:      Assessment and Plan:  Pregnancy: [id number] at [redacted]w[redacted]d  1. Trichomonas vaginitis Reports she took treatment TOC today - Cervicovaginal ancillary only( [place])  2. Supervision of high risk pregnancy, antepartum BP and FHR normal Swabs today as patient is to be delivered in two weeks Cervix 2 cm but still thick Has not had glucose  testing, will schedule 2hr GTT at checkout - Culture, beta strep (group b only)  3. Opioid use disorder Likes tablets better than films Taking 8 mg TID Uds today NAS consult placed Refill sent - ToxAssure Flex 15, Ur  4. Poor fetal growth affecting management of mother in third trimester, single or unspecified fetus Last growth [place] 03/26/2023 EFW 6%, 1894 g, AC 1.1%, AFI 12, normal dopplers Delivery recommended at 37 weeks Form faxed, orders placed  5. History of preterm premature rupture of membranes (PPROM)    Preterm labor symptoms and general obstetric precautions including but not limited to vaginal bleeding, contractions, leaking of fluid and fetal movement were reviewed in detail with the patient. Please refer to After Visit Summary for other counseling recommendations.  Return in 1 week (on 04/10/2023) for [facility], ob visit.   Venora Maples, MD

## 2023-04-04 LAB — CERVICOVAGINAL ANCILLARY ONLY
Bacterial Vaginitis (gardnerella): POSITIVE — AB
Candida Glabrata: NEGATIVE
Candida Vaginitis: POSITIVE — AB
Chlamydia: NEGATIVE
Comment: NEGATIVE
Comment: NEGATIVE
Comment: NEGATIVE
Comment: NEGATIVE
Comment: NEGATIVE
Comment: NORMAL
Neisseria Gonorrhea: NEGATIVE
Trichomonas: NEGATIVE

## 2023-04-06 LAB — CULTURE, BETA STREP (GROUP B ONLY): Strep Gp B Culture: POSITIVE — AB

## 2023-04-07 DIAGNOSIS — F1120 Opioid dependence, uncomplicated: Secondary | ICD-10-CM | POA: Insufficient documentation

## 2023-04-09 ENCOUNTER — Encounter (HOSPITAL_COMMUNITY): Payer: Self-pay | Admitting: *Deleted

## 2023-04-09 ENCOUNTER — Telehealth (HOSPITAL_COMMUNITY): Payer: Self-pay | Admitting: *Deleted

## 2023-04-09 ENCOUNTER — Encounter: Payer: Self-pay | Admitting: Family Medicine

## 2023-04-09 ENCOUNTER — Encounter (HOSPITAL_COMMUNITY): Payer: Self-pay

## 2023-04-09 ENCOUNTER — Other Ambulatory Visit (HOSPITAL_COMMUNITY): Payer: Self-pay

## 2023-04-09 LAB — TOXASSURE FLEX 15, UR
6-ACETYLMORPHINE IA: NEGATIVE ng/mL
7-aminoclonazepam: NOT DETECTED ng/mg creat
AMPHETAMINES IA: NEGATIVE ng/mL
Alpha-hydroxyalprazolam: NOT DETECTED ng/mg creat
Alpha-hydroxymidazolam: NOT DETECTED ng/mg creat
Alpha-hydroxytriazolam: NOT DETECTED ng/mg creat
Alprazolam: NOT DETECTED ng/mg creat
BARBITURATES IA: NEGATIVE ng/mL
BUPRENORPHINE: POSITIVE
Benzodiazepines: NEGATIVE
Buprenorphine: 584 ng/mg creat
COCAINE METABOLITE IA: NEGATIVE ng/mL
Clonazepam: NOT DETECTED ng/mg creat
Creatinine: 136 mg/dL
Desalkylflurazepam: NOT DETECTED ng/mg creat
Desmethyldiazepam: NOT DETECTED ng/mg creat
Desmethylflunitrazepam: NOT DETECTED ng/mg creat
Diazepam: NOT DETECTED ng/mg creat
ETHYL ALCOHOL Enzymatic: NEGATIVE g/dL
FENTANYL: NEGATIVE
Fentanyl: NOT DETECTED ng/mg creat
Flunitrazepam: NOT DETECTED ng/mg creat
Lorazepam: NOT DETECTED ng/mg creat
METHADONE IA: NEGATIVE ng/mL
METHADONE MTB IA: NEGATIVE ng/mL
Midazolam: NOT DETECTED ng/mg creat
Norbuprenorphine: 598 ng/mg creat
Norfentanyl: NOT DETECTED ng/mg creat
OPIATE CLASS IA: NEGATIVE ng/mL
Oxazepam: NOT DETECTED ng/mg creat
PHENCYCLIDINE IA: NEGATIVE ng/mL
TAPENTADOL, IA: NEGATIVE ng/mL
TRAMADOL IA: NEGATIVE ng/mL
Temazepam: NOT DETECTED ng/mg creat

## 2023-04-09 LAB — OXYCODONE CLASS, MS, UR RFX
Noroxycodone: NOT DETECTED ng/mg creat
Noroxymorphone: NOT DETECTED ng/mg creat
Oxycodone Class Confirmation: POSITIVE
Oxycodone: NOT DETECTED ng/mg creat
Oxymorphone: 62 ng/mg creat

## 2023-04-09 LAB — CANNABINOIDS, MS, UR RFX
Cannabinoids Confirmation: POSITIVE
Carboxy-THC: 8 ng/mg creat

## 2023-04-09 MED ORDER — FLUCONAZOLE 150 MG PO TABS
150.0000 mg | ORAL_TABLET | Freq: Once | ORAL | 0 refills | Status: AC
  Filled 2023-04-09: qty 1, 1d supply, fill #0

## 2023-04-09 MED ORDER — METRONIDAZOLE 0.75 % VA GEL
1.0000 | Freq: Every day | VAGINAL | 0 refills | Status: DC
  Filled 2023-04-09: qty 70, 7d supply, fill #0

## 2023-04-09 NOTE — Addendum Note (Signed)
Addended by: Merian Capron on: 04/09/2023 08:53 AM   Modules accepted: Orders

## 2023-04-09 NOTE — Telephone Encounter (Unsigned)
Preadmission screen  

## 2023-04-09 NOTE — Telephone Encounter (Signed)
Preadmission screen  

## 2023-04-10 ENCOUNTER — Other Ambulatory Visit: Payer: Self-pay | Admitting: Obstetrics

## 2023-04-10 ENCOUNTER — Ambulatory Visit: Payer: Medicaid Other | Attending: Obstetrics

## 2023-04-10 ENCOUNTER — Ambulatory Visit: Payer: Medicaid Other | Admitting: *Deleted

## 2023-04-10 VITALS — BP 115/68 | HR 104

## 2023-04-10 DIAGNOSIS — O99323 Drug use complicating pregnancy, third trimester: Secondary | ICD-10-CM | POA: Diagnosis not present

## 2023-04-10 DIAGNOSIS — O09893 Supervision of other high risk pregnancies, third trimester: Secondary | ICD-10-CM | POA: Diagnosis not present

## 2023-04-10 DIAGNOSIS — O09293 Supervision of pregnancy with other poor reproductive or obstetric history, third trimester: Secondary | ICD-10-CM

## 2023-04-10 DIAGNOSIS — O365930 Maternal care for other known or suspected poor fetal growth, third trimester, not applicable or unspecified: Secondary | ICD-10-CM

## 2023-04-10 DIAGNOSIS — Z3A36 36 weeks gestation of pregnancy: Secondary | ICD-10-CM

## 2023-04-10 DIAGNOSIS — O09899 Supervision of other high risk pregnancies, unspecified trimester: Secondary | ICD-10-CM | POA: Insufficient documentation

## 2023-04-10 DIAGNOSIS — O99320 Drug use complicating pregnancy, unspecified trimester: Secondary | ICD-10-CM | POA: Insufficient documentation

## 2023-04-10 DIAGNOSIS — F1120 Opioid dependence, uncomplicated: Secondary | ICD-10-CM

## 2023-04-10 DIAGNOSIS — F1990 Other psychoactive substance use, unspecified, uncomplicated: Secondary | ICD-10-CM | POA: Diagnosis not present

## 2023-04-10 DIAGNOSIS — O99333 Smoking (tobacco) complicating pregnancy, third trimester: Secondary | ICD-10-CM

## 2023-04-10 DIAGNOSIS — O09213 Supervision of pregnancy with history of pre-term labor, third trimester: Secondary | ICD-10-CM

## 2023-04-10 NOTE — Procedures (Signed)
Sandra Matthews 27-Mar-1996 [redacted]w[redacted]d  Fetus A Non-Stress Test Interpretation for 04/10/23- NST with BPP  Indication: IUGR  Fetal Heart Rate A Mode: External Baseline Rate (A): 130 bpm Variability: Moderate Accelerations: 15 x 15 Multiple birth?: No  Uterine Activity Mode: Toco Contraction Frequency (min): irreg Contraction Duration (sec): 60 Contraction Quality: Mild Resting Tone Palpated: Relaxed  Interpretation (Fetal Testing) Nonstress Test Interpretation: Reactive Overall Impression: Reassuring for gestational age Comments: Tracing reviewed by Dr. Judeth Cornfield

## 2023-04-11 ENCOUNTER — Encounter: Payer: Medicaid Other | Admitting: Family Medicine

## 2023-04-11 ENCOUNTER — Telehealth: Payer: Self-pay | Admitting: Pediatrics

## 2023-04-11 NOTE — Telephone Encounter (Signed)
Initial NAS telephone consult complete.  Eat, Sleep, Console reviewed as well as the availability of [employer] volunteers during the 5 day newborn observation period.  Mom with questions about tox screening and CPS involvement.  Reviewed hospital screening and reporting guidelines.  Mom was open about her OUD during pregnancy and concerns for CPS involvement with this baby and potentially her other children. Encouraged mom to be open with social work and CPS if involved.  Mom states the only baby supply she has Is a car seat.  She is receptive to referrals to social work and Dentist for assistance.  Will make referrals today.  Mom has selected [facility] for PCP. NAS consult number given and will follow with her again on 7/19 which will be a postpartum call, sooner if needs arise.

## 2023-04-12 ENCOUNTER — Other Ambulatory Visit: Payer: Self-pay | Admitting: Advanced Practice Midwife

## 2023-04-15 ENCOUNTER — Other Ambulatory Visit (HOSPITAL_COMMUNITY)
Admission: RE | Admit: 2023-04-15 | Discharge: 2023-04-15 | Disposition: A | Discharge status: Home / Self Care | Payer: Medicaid Other | Source: Ambulatory Visit | Attending: Family Medicine | Admitting: Family Medicine

## 2023-04-15 ENCOUNTER — Other Ambulatory Visit: Payer: Self-pay

## 2023-04-15 ENCOUNTER — Ambulatory Visit (INDEPENDENT_AMBULATORY_CARE_PROVIDER_SITE_OTHER): Payer: Medicaid Other | Admitting: General Practice

## 2023-04-15 VITALS — BP 133/79 | HR 100 | Ht 61.0 in | Wt 131.0 lb

## 2023-04-15 DIAGNOSIS — O09899 Supervision of other high risk pregnancies, unspecified trimester: Secondary | ICD-10-CM | POA: Insufficient documentation

## 2023-04-15 DIAGNOSIS — Z3A36 36 weeks gestation of pregnancy: Secondary | ICD-10-CM

## 2023-04-15 DIAGNOSIS — O09893 Supervision of other high risk pregnancies, third trimester: Secondary | ICD-10-CM

## 2023-04-15 NOTE — Progress Notes (Signed)
Patient presents to office today requesting self swab for STD testing prior to induction on Thursday. She reports good fetal movement. FHR 135bpm today. Patient instructed in self swab & specimen collected. Advised results will be back in 24-48 hours and available via mychart.   Chase Caller RN BSN 04/15/23

## 2023-04-16 ENCOUNTER — Telehealth: Payer: Self-pay | Admitting: Clinical

## 2023-04-16 NOTE — Telephone Encounter (Signed)
Attempt to follow up with pt; woman answered "Sandra Matthews isn't here"; didn't leave a message.

## 2023-04-17 ENCOUNTER — Inpatient Hospital Stay (HOSPITAL_COMMUNITY)
Admission: RE | Admit: 2023-04-17 | Discharge: 2023-04-19 | DRG: 806 | Disposition: A | Discharge status: Home / Self Care | Payer: Medicaid Other | Attending: Family Medicine | Admitting: Family Medicine

## 2023-04-17 ENCOUNTER — Other Ambulatory Visit: Payer: Self-pay

## 2023-04-17 ENCOUNTER — Encounter (HOSPITAL_COMMUNITY): Payer: Self-pay | Admitting: Family Medicine

## 2023-04-17 ENCOUNTER — Inpatient Hospital Stay (HOSPITAL_COMMUNITY): Payer: Medicaid Other

## 2023-04-17 DIAGNOSIS — A6000 Herpesviral infection of urogenital system, unspecified: Secondary | ICD-10-CM | POA: Diagnosis not present

## 2023-04-17 DIAGNOSIS — Z23 Encounter for immunization: Secondary | ICD-10-CM

## 2023-04-17 DIAGNOSIS — O365930 Maternal care for other known or suspected poor fetal growth, third trimester, not applicable or unspecified: Secondary | ICD-10-CM | POA: Diagnosis not present

## 2023-04-17 DIAGNOSIS — F1120 Opioid dependence, uncomplicated: Secondary | ICD-10-CM

## 2023-04-17 DIAGNOSIS — R7689 Other specified abnormal immunological findings in serum: Secondary | ICD-10-CM | POA: Diagnosis present

## 2023-04-17 DIAGNOSIS — O99820 Streptococcus B carrier state complicating pregnancy: Secondary | ICD-10-CM

## 2023-04-17 DIAGNOSIS — O0990 Supervision of high risk pregnancy, unspecified, unspecified trimester: Secondary | ICD-10-CM

## 2023-04-17 DIAGNOSIS — O9832 Other infections with a predominantly sexual mode of transmission complicating childbirth: Secondary | ICD-10-CM | POA: Diagnosis present

## 2023-04-17 DIAGNOSIS — O99824 Streptococcus B carrier state complicating childbirth: Secondary | ICD-10-CM | POA: Diagnosis present

## 2023-04-17 DIAGNOSIS — Z30017 Encounter for initial prescription of implantable subdermal contraceptive: Secondary | ICD-10-CM

## 2023-04-17 DIAGNOSIS — O365990 Maternal care for other known or suspected poor fetal growth, unspecified trimester, not applicable or unspecified: Secondary | ICD-10-CM | POA: Diagnosis present

## 2023-04-17 DIAGNOSIS — F1110 Opioid abuse, uncomplicated: Secondary | ICD-10-CM | POA: Diagnosis not present

## 2023-04-17 DIAGNOSIS — Z560 Unemployment, unspecified: Secondary | ICD-10-CM | POA: Diagnosis not present

## 2023-04-17 DIAGNOSIS — O99324 Drug use complicating childbirth: Secondary | ICD-10-CM | POA: Diagnosis present

## 2023-04-17 DIAGNOSIS — R768 Other specified abnormal immunological findings in serum: Secondary | ICD-10-CM | POA: Diagnosis present

## 2023-04-17 DIAGNOSIS — F17210 Nicotine dependence, cigarettes, uncomplicated: Secondary | ICD-10-CM | POA: Diagnosis present

## 2023-04-17 DIAGNOSIS — Z5982 Transportation insecurity: Secondary | ICD-10-CM

## 2023-04-17 DIAGNOSIS — F1190 Opioid use, unspecified, uncomplicated: Secondary | ICD-10-CM | POA: Diagnosis present

## 2023-04-17 DIAGNOSIS — O99334 Smoking (tobacco) complicating childbirth: Secondary | ICD-10-CM | POA: Diagnosis present

## 2023-04-17 DIAGNOSIS — Z3A37 37 weeks gestation of pregnancy: Secondary | ICD-10-CM

## 2023-04-17 LAB — RAPID URINE DRUG SCREEN, HOSP PERFORMED
Amphetamines: NOT DETECTED
Barbiturates: NOT DETECTED
Benzodiazepines: NOT DETECTED
Cocaine: NOT DETECTED
Opiates: NOT DETECTED
Tetrahydrocannabinol: NOT DETECTED

## 2023-04-17 LAB — CBC
HCT: 33.1 % — ABNORMAL LOW (ref 36.0–46.0)
Hemoglobin: 10.8 g/dL — ABNORMAL LOW (ref 12.0–15.0)
MCH: 30.7 pg (ref 26.0–34.0)
MCHC: 32.6 g/dL (ref 30.0–36.0)
MCV: 94 fL (ref 80.0–100.0)
Platelets: 349 10*3/uL (ref 150–400)
RBC: 3.52 MIL/uL — ABNORMAL LOW (ref 3.87–5.11)
RDW: 13.8 % (ref 11.5–15.5)
WBC: 13.4 10*3/uL — ABNORMAL HIGH (ref 4.0–10.5)
nRBC: 0 % (ref 0.0–0.2)

## 2023-04-17 LAB — TYPE AND SCREEN
ABO/RH(D): O POS
Donor AG Type: NEGATIVE
Unit division: 0
Unit division: 0

## 2023-04-17 LAB — BPAM RBC
Blood Product Expiration Date: 202407282359
Unit Type and Rh: 5100

## 2023-04-17 LAB — RPR: RPR Ser Ql: NONREACTIVE

## 2023-04-17 MED ORDER — OXYTOCIN-SODIUM CHLORIDE 30-0.9 UT/500ML-% IV SOLN
2.5000 [IU]/h | INTRAVENOUS | Status: DC
  Administered 2023-04-17: 2.5 [IU]/h via INTRAVENOUS

## 2023-04-17 MED ORDER — TERBUTALINE SULFATE 1 MG/ML IJ SOLN: 0.2500 mg | Freq: Once | INTRAMUSCULAR | Status: DC | PRN

## 2023-04-17 MED ORDER — ONDANSETRON HCL 4 MG/2ML IJ SOLN: 4.0000 mg | Freq: Four times a day (QID) | INTRAMUSCULAR | Status: DC | PRN

## 2023-04-17 MED ORDER — PENICILLIN G POT IN DEXTROSE 60000 UNIT/ML IV SOLN
3.0000 10*6.[IU] | INTRAVENOUS | Status: DC
  Administered 2023-04-17 (×2): 3 10*6.[IU] via INTRAVENOUS
  Filled 2023-04-17 (×2): qty 50

## 2023-04-17 MED ORDER — LIDOCAINE HCL (PF) 1 % IJ SOLN: 30.0000 mL | INTRAMUSCULAR | Status: DC | PRN

## 2023-04-17 MED ORDER — LACTATED RINGERS IV SOLN: INTRAVENOUS | Status: DC

## 2023-04-17 MED ORDER — IBUPROFEN 600 MG PO TABS
600.0000 mg | ORAL_TABLET | Freq: Four times a day (QID) | ORAL | Status: DC | PRN
  Administered 2023-04-17: 600 mg via ORAL
  Filled 2023-04-17: qty 1

## 2023-04-17 MED ORDER — FENTANYL CITRATE (PF) 100 MCG/2ML IJ SOLN: 50.0000 ug | INTRAMUSCULAR | Status: DC | PRN

## 2023-04-17 MED ORDER — MISOPROSTOL 25 MCG QUARTER TABLET
25.0000 ug | ORAL_TABLET | Freq: Once | ORAL | Status: DC
  Filled 2023-04-17: qty 1

## 2023-04-17 MED ORDER — OXYTOCIN-SODIUM CHLORIDE 30-0.9 UT/500ML-% IV SOLN: 1.0000 m[IU]/min | INTRAVENOUS | Status: DC

## 2023-04-17 MED ORDER — SODIUM CHLORIDE 0.9 % IV SOLN
5.0000 10*6.[IU] | Freq: Once | INTRAVENOUS | Status: AC
  Administered 2023-04-17: 5 10*6.[IU] via INTRAVENOUS

## 2023-04-17 MED ORDER — BUPRENORPHINE HCL-NALOXONE HCL 8-2 MG SL SUBL
1.0000 | SUBLINGUAL_TABLET | Freq: Three times a day (TID) | SUBLINGUAL | Status: DC
  Filled 2023-04-17 (×2): qty 1

## 2023-04-17 MED ORDER — OXYTOCIN-SODIUM CHLORIDE 30-0.9 UT/500ML-% IV SOLN
1.0000 m[IU]/min | INTRAVENOUS | Status: DC
  Administered 2023-04-17: 2 m[IU]/min via INTRAVENOUS
  Filled 2023-04-17: qty 500

## 2023-04-17 MED ORDER — SOD CITRATE-CITRIC ACID 500-334 MG/5ML PO SOLN: 30.0000 mL | ORAL | Status: DC | PRN

## 2023-04-17 MED ORDER — OXYTOCIN BOLUS FROM INFUSION
333.0000 mL | Freq: Once | INTRAVENOUS | Status: AC
  Administered 2023-04-17: 333 mL via INTRAVENOUS

## 2023-04-17 MED ORDER — ACETAMINOPHEN 325 MG PO TABS
650.0000 mg | ORAL_TABLET | ORAL | Status: DC | PRN
  Administered 2023-04-17 (×2): 650 mg via ORAL
  Filled 2023-04-17 (×2): qty 2

## 2023-04-17 MED ORDER — BUPRENORPHINE HCL-NALOXONE HCL 8-2 MG SL SUBL
1.0000 | SUBLINGUAL_TABLET | Freq: Every morning | SUBLINGUAL | Status: DC
  Administered 2023-04-18 – 2023-04-19 (×2): 1 via SUBLINGUAL
  Filled 2023-04-17 (×2): qty 1

## 2023-04-17 MED ORDER — MISOPROSTOL 25 MCG QUARTER TABLET
25.0000 ug | ORAL_TABLET | ORAL | Status: DC | PRN
  Administered 2023-04-17: 25 ug via VAGINAL

## 2023-04-17 MED ORDER — BUPRENORPHINE HCL-NALOXONE HCL 2-0.5 MG SL SUBL
2.0000 | SUBLINGUAL_TABLET | Freq: Two times a day (BID) | SUBLINGUAL | Status: DC
  Administered 2023-04-17 – 2023-04-19 (×4): 2 via SUBLINGUAL
  Filled 2023-04-17 (×5): qty 2

## 2023-04-17 MED ORDER — LACTATED RINGERS IV SOLN
500.0000 mL | INTRAVENOUS | Status: DC | PRN
  Administered 2023-04-17: 500 mL via INTRAVENOUS

## 2023-04-17 NOTE — H&P (Signed)
OBSTETRIC ADMISSION HISTORY AND PHYSICAL  Sandra Matthews is a 27 y.o. female [phone] with IUP at [redacted]w[redacted]d by LMP presenting for IOL 2/2 FGR. She reports +FMs, No LOF, no VB, no blurry vision, headaches or peripheral edema, and RUQ pain.  She plans on breast feeding. She request nexplanon for birth control. She received her prenatal care at  [facility]    Dating: By LMP --->  Estimated Date of Delivery: 05/08/23  Sono:    @[redacted]w[redacted]d , CWD, normal anatomy, cephalic presentation, posterior placental lie, 1894g, 6% EFW   Prenatal History/Complications:  -FGR -OUD (suboxone) -GBS+ -HSV-2 seropositive   Past Medical History: Past Medical History:  Diagnosis Date   Anemia    Anxiety and depression 02/04/2023   Depression 09/27/2022   PP stuff, was thinking of calling and talking with someone   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   + THC 01/31/2020   Preterm labor    Supervision of other high risk pregnancy, antepartum 11/13/2022          Nursing Staff  Provider  Office Location  [facility]  Dating   05/08/2023, by Last Menstrual Period  [facility] Model  [X]  Traditional  [ ]  Centering  [ ]  Mom-Baby Dyad        Language   English  Anatomy US   Normal>IUGR  Flu Vaccine      Genetic/Carrier Screen   NIPS:   low risk female  AFP:   not done  Horizon: neg 14/14  TDaP Vaccine    Deferred on 02/04/23 (would like at next visit)   Hgb A1C or   GTT    Past Surgical History: Past Surgical History:  Procedure Laterality Date   FRACTURE SURGERY     broken arm in childhood, cast only, no surgery    Obstetrical History: OB History     Gravida  5   Para  4   Term  3   Preterm  1   AB  0   Living  4      SAB  0   IAB  0   Ectopic  0   Multiple      Live Births  4           Social History Social History   Socioeconomic History   Marital status: Single    Spouse name: Not on file   Number of children: 3   Years of education: Not on file   Highest education level: Not on file  Occupational  History   Occupation: UNEMPLOYED  Tobacco Use   Smoking status: Every Day    Packs/day: 0.50    Years: 2.00    Additional pack years: 0.00    Total pack years: 1.00    Types: Cigarettes   Smokeless tobacco: Never  Vaping Use   Vaping Use: Former  Substance and Sexual Activity   Alcohol use: No   Drug use: Yes    Comment: Percocet on Tuesday   Sexual activity: Yes    Birth control/protection: None  Other Topics Concern   Not on file  Social History Narrative   ** Merged History Encounter **       Social Determinants of Health   Financial Resource Strain: Not on file  Food Insecurity: Not on file  Transportation Needs: Unmet Transportation Needs (04/17/2023)   PRAPARE - Transportation    Lack of Transportation (Medical): No    Lack of Transportation (Non-Medical): Yes  Physical Activity: Not on file  Stress: Not on  file  Social Connections: Not on file    Family History: Family History  Problem Relation Age of Onset   Diabetes Mother    Healthy Mother    Bipolar disorder Father    Depression Father    Anxiety disorder Father    Arthritis Father    Diabetes Maternal Grandmother    Hypertension Maternal Grandfather    Depression Paternal Grandmother     Allergies: No Known Allergies  Medications Prior to Admission  Medication Sig Dispense Refill Last Dose   acetaminophen (TYLENOL) 500 MG tablet Take 1,000 mg by mouth every 6 (six) hours as needed. (Patient not taking: Reported on 02/04/2023)      buprenorphine-naloxone (SUBOXONE) 8-2 mg SUBL SL tablet Place 1 tablet under the tongue 3 (three) times daily for 21 days. 63 tablet 0    metroNIDAZOLE (METROGEL) 0.75 % vaginal gel Place 1 Applicatorful vaginally at bedtime. 70 g 0    naloxone (NARCAN) nasal spray 4 mg/0.1 mL Place 1 spray into the nose once as needed for up to 1 dose (overdose). (Patient not taking: Reported on 03/06/2023) 1 each 0    ondansetron (ZOFRAN ODT) 4 MG disintegrating tablet Take 1 tablet (4  mg total) by mouth every 6 (six) hours as needed for nausea. 20 tablet 1    Prenatal Vit-Fe Phos-FA-Omega (VITAFOL GUMMIES) 3.33-0.333-34.8 MG CHEW Chew 1 tablet by mouth daily. 90 tablet 5    promethazine (PHENERGAN) 25 MG tablet Take 1 tablet (25 mg total) by mouth every 6 (six) hours as needed for nausea or vomiting. 30 tablet 0      Review of Systems   All systems reviewed and negative except as stated in HPI  Height 5\' 1"  (1.549 m), weight 59.9 kg, last menstrual period 08/01/2022. General appearance: alert and no distress Lungs: normal effort Heart: regular rate noted Abdomen: gravid Extremities: No LE edema Presentation: cephalic Fetal monitoringBaseline: 120 bpm, Variability: Good {> 6 bpm), Accelerations: Reactive, and Decelerations: Absent Uterine activity irritability  Dilation: Closed Effacement (%): Thick Station: -3 Exam by:: Sherol Dade RN  Prenatal labs: ABO, Rh: --/--/O POS (06/27 0645) Antibody: POS (06/27 0645) Rubella: 2.12 (01/24 1204) RPR: Non Reactive (01/24 1204)  HBsAg: Negative (01/24 1204)  HIV: Non Reactive (01/24 1204)  GBS: Positive/-- (06/13 1427)  1 hr Glucola 141 Genetic screening  LR, neg Anatomy US FGR  Prenatal Transfer Tool  Maternal Diabetes: No Genetic Screening: Normal Maternal Ultrasounds/Referrals: IUGR Fetal Ultrasounds or other Referrals:  None Maternal Substance Abuse:  Yes:  Type: Other: OUD, taking suboxone Significant Maternal Medications:  Meds include: Other:   suboxone Significant Maternal Lab Results:  Group B Strep positive Number of Prenatal Visits:Less than or equal to 3 verified prenatal visits Other Comments:  None  Results for orders placed or performed during the hospital encounter of 04/17/23 (from the past 24 hour(s))  CBC   Collection Time: 04/17/23  6:45 AM  Result Value Ref Range   WBC 13.4 (H) 4.0 - 10.5 K/uL   RBC 3.52 (L) 3.87 - 5.11 MIL/uL   Hemoglobin 10.8 (L) 12.0 - 15.0 g/dL   HCT 16.1  (L) 09.6 - 46.0 %   MCV 94.0 80.0 - 100.0 fL   MCH 30.7 26.0 - 34.0 pg   MCHC 32.6 30.0 - 36.0 g/dL   RDW 04.5 40.9 - 81.1 %   Platelets 349 150 - 400 K/uL   nRBC 0.0 0.0 - 0.2 %  Type and screen   Collection Time:  04/17/23  6:45 AM  Result Value Ref Range   ABO/RH(D) O POS    Antibody Screen POS    Sample Expiration      04/20/2023,2359 Performed at [facility], [address], [place], [place] [address]     Patient Active Problem List   Diagnosis Date Noted   Suboxone maintenance treatment complicating pregnancy, antepartum (HCC) 04/07/2023   Supervision of other high risk pregnancy, antepartum 11/13/2022   Opioid use disorder 11/05/2022   IUGR (intrauterine growth restriction) affecting care of mother 03/17/2018   GBS (group B Streptococcus carrier), +RV culture, currently pregnant 03/02/2018   History of preterm premature rupture of membranes (PPROM) 12/17/2017    Assessment/Plan:  Sandra Matthews is a 27 y.o. [id number] at [redacted]w[redacted]d here for IOL 2/2 FGR EFW 6%, normal dopplers.   #Labor: Start with 25 mcg vaginal cytotec. #Pain: Unsure #FWB: Cat I  #ID: GBS pos, PCN ppx #MOF: Breast #MOC: Nexplanon #Circ: Yes  OUD Suboxone 8mg  am, 4mg  noon, 4 mg nightly. Has not taken in 2-3 days. Denies symptoms of withdrawal.  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 Has never had an outbreak. Not taking valtrex suppression. Has never take suppression for any prior pregnancy.  Valda Christenson Autry-Lott, DO  04/17/2023, 9:03 AM

## 2023-04-17 NOTE — Progress Notes (Signed)
Labor Progress Note Sandra Matthews is a 27 y.o. [id number] at [redacted]w[redacted]d presented for IOL.   S: No acute concerns.   O:  BP 114/79   Pulse 82   Temp 98.3 F (36.8 C)   Ht 5\' 1"  (1.549 m)   Wt 59.9 kg   LMP 08/01/2022   BMI 24.94 kg/m  EFM: 120bpm/moderate/+accels, no decels  CVE: Dilation: 2 Effacement (%): 50 Station: -2 Presentation: Vertex Exam by:: Sherol Dade RN   A&P: 27 y.o. [id number] [redacted]w[redacted]d here for IOL 2/2 FGR.  #Labor: Progressing well. Started on pitocin. Consider AROM when appropriate.  #Pain: maternally supported #FWB: Cat I  #GBS positive, PCN ppx  Gavriella Hearst Autry-Lott, DO 1:52 PM

## 2023-04-17 NOTE — Discharge Summary (Addendum)
Postpartum Discharge Summary    Patient Name: Sandra Matthews DOB: 06-Aug-1996 MRN: [id number]  Date of admission: 04/17/2023 Delivery date:04/17/2023  Delivering provider: Jacklyn Shell  Date of discharge: 04/19/2023  Admitting diagnosis: IUGR (intrauterine growth restriction) affecting care of mother [O36.5990] Intrauterine pregnancy: [redacted]w[redacted]d     Secondary diagnosis:  Principal Problem:   IUGR (intrauterine growth restriction) affecting care of mother Active Problems:   GBS (group B Streptococcus carrier), +RV culture, currently pregnant   Opioid use disorder   Suboxone maintenance treatment complicating pregnancy, antepartum (HCC)   HSV-2 seropositive   Vaginal delivery  Additional problems: none    Discharge diagnosis: Term Pregnancy Delivered and Fetal growth restriction                                               Post partum procedures: Nexplanon     Augmentation: AROM and Pitocin Complications: None  Hospital course: Induction of Labor With Vaginal Delivery   27 y.o. yo [id number] at [redacted]w[redacted]d was admitted to the hospital 04/17/2023 for induction of labor.  Indication for induction:  FGR .  Patient had an labor course complicated by nothing Membrane Rupture Time/Date: 4:40 PM ,04/17/2023   Delivery Method:Vaginal, Spontaneous  Episiotomy: None  Lacerations:  None  Details of delivery can be found in separate delivery note.  Patient had a postpartum course remarkable for having a Nexplanon placed on PPD#1 which she tolerated well (see note). She expressed a desire for her son to have a circumcision- she was consented and a note placed in his chart. Patient is discharged home 04/19/23.  Newborn Data: Birth date:04/17/2023  Birth time:10:22 PM  Gender:Female  Living status:Living  Apgars:9 ,9  Weight:2320 g (5lb 1.8oz)  Magnesium Sulfate received: No BMZ received: No Rhophylac:N/A MMR:N/A T-DaP:Given postpartum Flu: N/A Transfusion:No  Physical exam   Vitals:   04/18/23 0619 04/18/23 1009 04/18/23 1329 04/18/23 2237  BP: 102/82 103/62 113/75 104/63  Pulse: 75 82 73 87  Resp: 16 20 20 18   Temp: 98.3 F (36.8 C) 98.7 F (37.1 C) 98.8 F (37.1 C) 98.1 F (36.7 C)  TempSrc:  Oral Oral Oral  SpO2:  100% 100% 98%  Weight:      Height:       General: alert and cooperative Lochia: appropriate Uterine Fundus: firm Incision: N/A DVT Evaluation: No evidence of DVT seen on physical exam. Labs: Lab Results  Component Value Date   WBC 13.4 (H) 04/17/2023   HGB 10.8 (L) 04/17/2023   HCT 33.1 (L) 04/17/2023   MCV 94.0 04/17/2023   PLT 349 04/17/2023      Latest Ref Rng & Units 02/11/2023    5:21 PM  CMP  Glucose 70 - 99 mg/dL 85   BUN 6 - 20 mg/dL 7   Creatinine 1.47 - 8.29 mg/dL 5.62   Sodium 130 - 865 mmol/L 133   Potassium 3.5 - 5.1 mmol/L 3.5   Chloride 98 - 111 mmol/L 101   CO2 22 - 32 mmol/L 22   Calcium 8.9 - 10.3 mg/dL 9.3    Edinburgh Score:    04/18/2023   10:09 AM  Edinburgh Postnatal Depression Scale Screening Tool  I have been able to laugh and see the funny side of things. 0  I have looked forward with enjoyment to things. 0  I have blamed myself  unnecessarily when things went wrong. 1  I have been anxious or worried for no good reason. 0  I have felt scared or panicky for no good reason. 0  Things have been getting on top of me. 0  I have been so unhappy that I have had difficulty sleeping. 0  I have felt sad or miserable. 0  I have been so unhappy that I have been crying. 0  The thought of harming myself has occurred to me. 0  Edinburgh Postnatal Depression Scale Total 1     After visit meds:  Allergies as of 04/19/2023   No Known Allergies      Medication List     STOP taking these medications    acetaminophen 500 MG tablet Commonly known as: TYLENOL   metroNIDAZOLE 0.75 % vaginal gel Commonly known as: METROGEL   ondansetron 4 MG disintegrating tablet Commonly known as: Zofran ODT    promethazine 25 MG tablet Commonly known as: PHENERGAN       TAKE these medications    Buprenorphine HCl-Naloxone HCl 2-0.5 MG Film Commonly known as: Suboxone Place 2 Film under the tongue in the morning and at bedtime. What changed: You were already taking a medication with the same name, and this prescription was added. Make sure you understand how and when to take each.   buprenorphine-naloxone 8-2 mg Subl SL tablet Commonly known as: SUBOXONE Place 1 tablet under the tongue in the morning. Start taking on: April 20, 2023 What changed: when to take this   ibuprofen 600 MG tablet Commonly known as: ADVIL Take 1 tablet (600 mg total) by mouth every 6 (six) hours as needed for fever or headache.   naloxone 4 MG/0.1ML Liqd nasal spray kit Commonly known as: NARCAN Place 1 spray into the nose once as needed for up to 1 dose (overdose).   Vitafol Gummies 3.33-0.333-34.8 MG Chew Chew 1 tablet by mouth daily.         Discharge home in stable condition Infant Feeding: Bottle and Breast Infant Disposition: rooming in for NAS Discharge instruction: per After Visit Summary and Postpartum booklet. Activity: Advance as tolerated. Pelvic rest for 6 weeks.  Diet: routine diet Future Appointments: Future Appointments  Date Time Provider Department Center  05/26/2023  3:50 PM Constant, Gigi Gin, MD [facility] None   Follow up Visit:   Please schedule this patient for a In person postpartum visit in 4 weeks with the following provider:  eckstat if possible . Additional Postpartum F/U:  Low risk pregnancy complicated by:  FGR Delivery mode:  Vaginal, Spontaneous  Anticipated Birth Control:  PP Nexplanon placed on PPD#1      04/19/2023 Arabella Merles, CNM 1:32 PM

## 2023-04-17 NOTE — Progress Notes (Signed)
Labor Progress Note Sandra Matthews is a 27 y.o. [id number] at [redacted]w[redacted]d presented for IOL.   S: No acute concerns.   O:  BP 104/66   Pulse 73   Temp 98.3 F (36.8 C)   Ht 5\' 1"  (1.549 m)   Wt 59.9 kg   LMP 08/01/2022   BMI 24.94 kg/m  EFM: 120bpm/moderate/+accels, no decels  CVE: Dilation: 3 Effacement (%): 60 Station: -1 Presentation: Vertex Exam by:: Dr. Salvadore Dom   A&P: 27 y.o. [id number] [redacted]w[redacted]d here for IOL 2/2 FGR.  #Labor: s/p AROM clear fluid. Continue pitocin.  #Pain: maternally supported #FWB: Cat I  #GBS positive, PCN ppx  [name], DO 5:04 PM

## 2023-04-18 ENCOUNTER — Encounter (HOSPITAL_COMMUNITY): Payer: Self-pay | Admitting: Family Medicine

## 2023-04-18 DIAGNOSIS — Z30017 Encounter for initial prescription of implantable subdermal contraceptive: Secondary | ICD-10-CM

## 2023-04-18 LAB — CERVICOVAGINAL ANCILLARY ONLY
Chlamydia: NEGATIVE
Comment: NEGATIVE
Comment: NEGATIVE
Comment: NORMAL
Neisseria Gonorrhea: NEGATIVE
Trichomonas: NEGATIVE

## 2023-04-18 MED ORDER — DIBUCAINE (PERIANAL) 1 % EX OINT: 1.0000 | TOPICAL_OINTMENT | CUTANEOUS | Status: DC | PRN

## 2023-04-18 MED ORDER — SENNOSIDES-DOCUSATE SODIUM 8.6-50 MG PO TABS
2.0000 | ORAL_TABLET | ORAL | Status: DC
  Administered 2023-04-18 – 2023-04-19 (×2): 2 via ORAL
  Filled 2023-04-18 (×2): qty 2

## 2023-04-18 MED ORDER — OXYCODONE HCL 5 MG PO TABS
5.0000 mg | ORAL_TABLET | Freq: Once | ORAL | Status: AC
  Administered 2023-04-18: 5 mg via ORAL
  Filled 2023-04-18: qty 1

## 2023-04-18 MED ORDER — MEASLES, MUMPS & RUBELLA VAC IJ SOLR: 0.5000 mL | Freq: Once | INTRAMUSCULAR | Status: DC

## 2023-04-18 MED ORDER — FERROUS SULFATE 325 (65 FE) MG PO TABS
325.0000 mg | ORAL_TABLET | ORAL | Status: DC
  Administered 2023-04-18: 325 mg via ORAL
  Filled 2023-04-18: qty 1

## 2023-04-18 MED ORDER — TETANUS-DIPHTH-ACELL PERTUSSIS 5-2.5-18.5 LF-MCG/0.5 IM SUSY
0.5000 mL | PREFILLED_SYRINGE | Freq: Once | INTRAMUSCULAR | Status: AC
  Administered 2023-04-18: 0.5 mL via INTRAMUSCULAR
  Filled 2023-04-18: qty 0.5

## 2023-04-18 MED ORDER — ACETAMINOPHEN 325 MG PO TABS
650.0000 mg | ORAL_TABLET | ORAL | Status: DC
  Administered 2023-04-18 – 2023-04-19 (×6): 650 mg via ORAL
  Filled 2023-04-18 (×6): qty 2

## 2023-04-18 MED ORDER — ETONOGESTREL 68 MG ~~LOC~~ IMPL
68.0000 mg | DRUG_IMPLANT | Freq: Once | SUBCUTANEOUS | Status: AC
  Administered 2023-04-18: 68 mg via SUBCUTANEOUS
  Filled 2023-04-18: qty 1

## 2023-04-18 MED ORDER — MEDROXYPROGESTERONE ACETATE 150 MG/ML IM SUSP: 150.0000 mg | INTRAMUSCULAR | Status: DC | PRN

## 2023-04-18 MED ORDER — SIMETHICONE 80 MG PO CHEW: 80.0000 mg | CHEWABLE_TABLET | ORAL | Status: DC | PRN

## 2023-04-18 MED ORDER — DIPHENHYDRAMINE HCL 25 MG PO CAPS: 25.0000 mg | ORAL_CAPSULE | Freq: Four times a day (QID) | ORAL | Status: DC | PRN

## 2023-04-18 MED ORDER — LIDOCAINE HCL 1 % IJ SOLN
0.0000 mL | Freq: Once | INTRAMUSCULAR | Status: AC | PRN
  Administered 2023-04-18: 20 mL via INTRADERMAL
  Filled 2023-04-18: qty 20

## 2023-04-18 MED ORDER — ONDANSETRON HCL 4 MG/2ML IJ SOLN: 4.0000 mg | INTRAMUSCULAR | Status: DC | PRN

## 2023-04-18 MED ORDER — WITCH HAZEL-GLYCERIN EX PADS: 1.0000 | MEDICATED_PAD | CUTANEOUS | Status: DC | PRN

## 2023-04-18 MED ORDER — ONDANSETRON HCL 4 MG PO TABS: 4.0000 mg | ORAL_TABLET | ORAL | Status: DC | PRN

## 2023-04-18 MED ORDER — FLEET ENEMA 7-19 GM/118ML RE ENEM: 1.0000 | ENEMA | Freq: Every day | RECTAL | Status: DC | PRN

## 2023-04-18 MED ORDER — METHYLERGONOVINE MALEATE 0.2 MG PO TABS: 0.2000 mg | ORAL_TABLET | ORAL | Status: DC | PRN

## 2023-04-18 MED ORDER — BISACODYL 10 MG RE SUPP: 10.0000 mg | Freq: Every day | RECTAL | Status: DC | PRN

## 2023-04-18 MED ORDER — METHYLERGONOVINE MALEATE 0.2 MG/ML IJ SOLN: 0.2000 mg | INTRAMUSCULAR | Status: DC | PRN

## 2023-04-18 MED ORDER — IBUPROFEN 600 MG PO TABS
600.0000 mg | ORAL_TABLET | Freq: Four times a day (QID) | ORAL | Status: DC
  Administered 2023-04-18 – 2023-04-19 (×6): 600 mg via ORAL
  Filled 2023-04-18 (×6): qty 1

## 2023-04-18 MED ORDER — ACETAMINOPHEN 325 MG PO TABS
650.0000 mg | ORAL_TABLET | ORAL | Status: DC | PRN
  Administered 2023-04-18 (×2): 650 mg via ORAL
  Filled 2023-04-18 (×2): qty 2

## 2023-04-18 MED ORDER — PRENATAL MULTIVITAMIN CH
1.0000 | ORAL_TABLET | Freq: Every day | ORAL | Status: DC
  Administered 2023-04-18 – 2023-04-19 (×2): 1 via ORAL
  Filled 2023-04-18 (×2): qty 1

## 2023-04-18 MED ORDER — BENZOCAINE-MENTHOL 20-0.5 % EX AERO: 1.0000 | INHALATION_SPRAY | CUTANEOUS | Status: DC | PRN

## 2023-04-18 MED ORDER — COCONUT OIL OIL: 1.0000 | TOPICAL_OIL | Status: DC | PRN

## 2023-04-18 NOTE — Plan of Care (Signed)

## 2023-04-18 NOTE — Procedures (Signed)
PRE-OP DIAGNOSIS: desired long-term, reversible contraception   POST-OP DIAGNOSIS: Same   PROCEDURE: Nexplanon  placement  Performing Physician: Shonna Chock, MD   PROCEDURE:  Written informed consent obtained Site (check): left arm        Sterile Preparation:    [x]       Betadine        [_]     Chloraprep      Written informed consent obtained, discussed risks including bleeding, infection, injury to surrounding tissue, and side effects including unscheduled bleeding.         After a time-out, insertion site was selected 6 - 10 cm from medial epicondyle and marked. Procedure area was prepped and draped in a sterile fashion. 3 mL of 1% lidocaine w/o epinephrine was used for subcutaneous anesthesia. Anesthesia confirmed.  Nexplanon  trocar was inserted subcutaneously and then Nexplanon  capsule delivered subcutaneously. Trocar was removed from the insertion site. Nexplanon  capsule was palpated by provider and patient to assure satisfactory placement.  Estimated blood loss: minimal Dressings applied: steri-strip, band-aid, coband Followup: The patient tolerated the procedure well without complications.  Standard post-procedure care wass explained and return precautions were given.

## 2023-04-18 NOTE — Progress Notes (Addendum)
Post Partum Day 1 Subjective: no complaints, up ad lib, voiding and tolerating PO, small lochia, plans to breastfeed, plans to bottle feed,  plans nexplanon inpt   Had stopped suboxone 4 days ago, restarted last night.  Pt feels well.  Objective: Blood pressure 102/82, pulse 75, temperature 98.3 F (36.8 C), resp. rate 16, height 5\' 1"  (1.549 m), weight 59.9 kg, last menstrual period 08/01/2022, SpO2 100 %, unknown if currently breastfeeding.  Physical Exam:  General: alert, cooperative and no distress Lochia:normal flow Chest: CTAB Heart: RRR no m/r/g Abdomen: +BS, soft, nontender,  Uterine Fundus: firm DVT Evaluation: No evidence of DVT seen on physical exam. Extremities: no edema  Recent Labs    04/17/23 0645  HGB 10.8*  HCT 33.1*    Assessment/Plan: Lactation consult, Social Work consult, Circumcision prior to discharge, and Contraception wants inpt nexplanon   LOS: 1 day   Jacklyn Shell 04/18/2023, 8:06 AM

## 2023-04-18 NOTE — Clinical Social Work Maternal (Addendum)
CLINICAL SOCIAL WORK MATERNAL/CHILD NOTE  Patient Details  Name: ADAH WEGNER MRN: [id number] Date of Birth: 1996/01/26  Date:  04/18/2023  Clinical Social Worker Initiating Note:  Enos Fling Date/Time: Initiated:  04/18/23/1425     Child's Name:  Truddie Crumble (does not have last name yet)   Biological Parents:  Mother, Father Mountford Clason and Andrena Mews)   Need for Interpreter:  None   Reason for Referral:  Current Substance Use/Substance Use During Pregnancy  , Behavioral Health Concerns   Address:  [address] [place] [address]    Phone number:  [phone] (home) [phone] (work)    Additional phone number:   Household Members/Support Persons (HM/SP):   Household Member/Support Person 1, Household Member/Support Person 2, Household Member/Support Person 3, Household Member/Support Person 4   HM/SP Name Relationship DOB or Age  HM/SP -1 Reed Pandy MOB's son 03-09-2011  HM/SP -2 Larry Sierras MOB's daughter 03-24-2016  HM/SP -3 Vito Backers MOB's son 03-17-2018  HM/SP -4 Messiah Fredric Mare MOB's son 07-30-2020  HM/SP -5        HM/SP -6        HM/SP -7        HM/SP -8          Natural Supports (not living in the home):  Immediate Family, Children   Professional Supports: Therapist   Employment: Unemployed   Type of Work:     Education:  9 to 11 years   Homebound arranged:    Surveyor, quantity Resources:  Medicaid   Other Resources:  Sales executive  , WIC (completed WIC referral)   Cultural/Religious Considerations Which May Impact Care:    Strengths:  Ability to meet basic needs  , Merchandiser, retail, Home prepared for child  , Understanding of illness   Psychotropic Medications:         Pediatrician:    Armed forces operational officer area  Pediatrician List:   [facility]  [place]      Pediatrician Fax Number:    Risk  Factors/Current Problems:  Mental Health Concerns  , Substance Use     Cognitive State:  Able to Concentrate  , Alert  , Goal Oriented  , Insightful  , Linear Thinking     Mood/Affect:  Interested  , Comfortable  , Calm  , Relaxed     CSW Assessment: CSW received a consult for a history of opioid use, anxiety and depression. CSW met MOB at bedside to complete a full psychosocial assessment. CSW entered the room, introduced herself and explained the reason for the visit. MOB was polite, easy to engage, receptive to meeting with CSW, and appeared forthcoming.  CSW collected MOB's demographic information; and she reported CPS history with Christiana Chavis(03-24-2016) infant tested positive for THC, and with Messiah Bailey infant's cord tested positive for opioids and THC, both cases were closed less than a month later. CSW inquired about MOB's mental health history. MOB reported experiencing anxiety and depression, due to the people she would associate with and different life struggles. MOB reported being prescribed Zoloft in the past and is currently participating in therapy at [facility]. MOB reported interested in changing to [facility] in the near future for further mental health support. MOB reported currently feeling "good" and bonded well with infant. CSW provided education regarding the baby blues period vs. perinatal mood disorders, discussed treatment  and gave resources for mental health follow up if concerns arise.  CSW recommends self-evaluation during the postpartum time period using the New Mom Checklist from Postpartum Progress and encouraged MOB to contact a medical professional if symptoms are noted at any time. CSW assessed for safety with MOB SI/HI/DV;MOB denied all.  CSW assessed for resources needs with MOB. MOB reported currently receiving foodstamps and is interested in [facility]. CSW informed MOB that a referral for [facility] will be completed and to follow up with the documents provided.  MOB reported having a carseat for infant and needs a safe sleeping area for infant. CSW informed MOB that we will provide a pack and play upon discharge. CSW provided review of Sudden Infant Death Syndrome (SIDS) precautions.  CSW informed MOB with the substance during pregnancy, the hospital will perform a UDS and CDS on infant. If the screenings return with positive results a report to CPS will be made. MOB reported using opioids and THC since 2017 then beginning Suboxone in 2021. MOB reported when she is not able to obtain her Suboxone; she will use substances like oxycodone and percocet until she can obtain her suboxone. MOB reported throughout her pregnancy she used oxycodone and percocet and her last use was 1 month ago. MOB reported the reason for use is because of her anxiety and depression.   Infant's urine was negative for all substances, Pending umbilical cord results   CSW made a CPS report with [place] due to [facility] admitting to use of substances during pregnancy.  Per [place] CPS intake worker the report was screened out.  CSW Plan/Description:   No Further Intervention Required/No Barriers to Discharge, Sudden Infant Death Syndrome (SIDS) Education, Perinatal Mood and Anxiety Disorder (PMADs) Education, Hospital Drug Screen Policy Information, Other Information/Referral to [facility], CSW Will Continue to Monitor Umbilical Cord Tissue Drug Screen Results and Make Report if Joanie Coddington, LCSW 04/18/2023, 2:29 PM

## 2023-04-18 NOTE — Lactation Note (Signed)
This note was copied from a baby's chart. Lactation Consultation Note  Patient Name: Boy Cheyenne Ingemi ZOXWR'U Date: 04/18/2023 Age:28 hours Reason for consult: Initial assessment;Early term 37-38.6wks;Infant < 6lbs.   Current feeding preference is breast and formula feeding infant.   P5, ETI/ LBW infant less that 5 lbs 8 ounces. Birth Parent attempt latch infant on her right breast using the cross cradle hold, infant only held nipple in mouth did not elicit the suck swallow response. Afterwards infant consumed 10 mls of 22 kcal formula using clear and lavender extra slow flow bottle nipple. LC discussed infant's input and output, the importance of maternal rest, diet and hydration. Birth Parent was set up with DEBP using 21 mm breast flange, LC reviewed how to use pump but Birth Parent is tired and would like to start pumping in the morning. Birth Parent is experienced with breastfeeding see maternal data below. Birth Parent knows to call RN/LC for latch assistance if needed.  Birth Parent was  made aware of O/P services, breastfeeding support groups, community resources, and our phone # for post-discharge questions.    Current feeding plan: 1- Birth Parent will follow LPTI/LBW feeding guidelines, BF infant every 3 hours and limit chest and bottle feeding to 30 minutes or less. 2- Birth Parent will supplement infant after each feeding with 11-12 mls of 22 kcal/ EBM per feeding based on infant's birth weight on Day 1. 3- Birth Parent will pump every 3 hours for 15 minutes on initial setting and offer any EBM first that is pumped before formula.  Maternal Data Has patient been taught Hand Expression?: Yes Does the patient have breastfeeding experience prior to this delivery?: Yes How long did the patient breastfeed?: Per Birth Parent, BF 1st child for 8 months, 2nd & 3rd child for 18 months and 4 th child who is currently 1 years for 12 months.  Feeding Mother's Current Feeding Choice:  Breast Milk  LATCH Score Latch: Too sleepy or reluctant, no latch achieved, no sucking elicited.  Audible Swallowing: None  Type of Nipple: Everted at rest and after stimulation  Comfort (Breast/Nipple): Soft / non-tender  Hold (Positioning): Assistance needed to correctly position infant at breast and maintain latch.  LATCH Score: 5   Lactation Tools Discussed/Used Tools: Pump;Flanges Flange Size: 21 Breast pump type: Double-Electric Breast Pump Pump Education: Setup, frequency, and cleaning;Milk Storage Reason for Pumping: Infant is LBW infant at 37 weeks Pumping frequency: Birth Parent will pump every 3 hours for 15 minutes on inital setting.  Interventions Interventions: Breast feeding basics reviewed;Adjust position;Support pillows;Assisted with latch;Skin to skin;Position options;Education;Pace feeding;Breast compression;LPT handout/interventions;LC Services brochure  Discharge Pump: DEBP  Consult Status Consult Status: Follow-up Date: 04/18/23 Follow-up type: In-patient    Frederico Hamman 04/18/2023, 1:46 AM

## 2023-04-19 ENCOUNTER — Other Ambulatory Visit: Payer: Self-pay | Admitting: Advanced Practice Midwife

## 2023-04-19 ENCOUNTER — Other Ambulatory Visit (HOSPITAL_COMMUNITY): Payer: Self-pay

## 2023-04-19 MED ORDER — BUPRENORPHINE HCL-NALOXONE HCL 8-2 MG SL SUBL
1.0000 | SUBLINGUAL_TABLET | Freq: Every morning | SUBLINGUAL | 0 refills | Status: DC
  Filled 2023-04-19 – 2023-04-25 (×3): qty 30, 30d supply, fill #0

## 2023-04-19 MED ORDER — BUPRENORPHINE HCL-NALOXONE HCL 2-0.5 MG SL SUBL
2.0000 | SUBLINGUAL_TABLET | Freq: Two times a day (BID) | SUBLINGUAL | 0 refills | Status: DC
  Filled 2023-04-19: qty 30, 8d supply, fill #0

## 2023-04-19 MED ORDER — BUPRENORPHINE HCL-NALOXONE HCL 2-0.5 MG SL FILM: 2.0000 | ORAL_FILM | Freq: Two times a day (BID) | SUBLINGUAL | 0 refills | Status: DC

## 2023-04-19 MED ORDER — IBUPROFEN 600 MG PO TABS
600.0000 mg | ORAL_TABLET | Freq: Four times a day (QID) | ORAL | 0 refills | Status: DC | PRN
  Filled 2023-04-19: qty 30, 8d supply, fill #0

## 2023-04-19 NOTE — Lactation Note (Signed)
This note was copied from a baby's chart. Lactation Consultation Note  Patient Name: Sandra Matthews IHKVQ'Q Date: 04/19/2023 Age:28 hours  LC attempted to see Birth Parent at her requested, when [facility] entered the room family asleep. Birth Parent will be followed up by [facility] services in the morning.    Maternal Data    Feeding Nipple Type: Extra Slow Flow  LATCH Score                    Lactation Tools Discussed/Used    Interventions    Discharge    Consult Status      Frederico Hamman 04/19/2023, 3:12 AM

## 2023-04-20 ENCOUNTER — Ambulatory Visit (HOSPITAL_COMMUNITY): Payer: Self-pay

## 2023-04-20 NOTE — Lactation Note (Signed)
This note was copied from a baby's chart. Lactation Consultation Note  Patient Name: Sandra Matthews IHKVQ'Q Date: 04/20/2023 Age:28 hours   LC attempted to visit with the birth parent but she was not in the room.  The support person stated that the birth parent stepped out of the room.  LC wrote her name on the board and encouraged the birth parent to call lactation when she returns.   Maternal Data    Feeding Nipple Type: Nfant Slow Flow (purple)  LATCH Score                    Lactation Tools Discussed/Used    Interventions    Discharge    Consult Status      Orvil Feil Makyi Ledo 04/20/2023, 4:53 PM

## 2023-04-21 ENCOUNTER — Other Ambulatory Visit (HOSPITAL_COMMUNITY): Payer: Self-pay

## 2023-04-21 LAB — SURGICAL PATHOLOGY

## 2023-04-21 LAB — TYPE AND SCREEN
Antibody Screen: POSITIVE
Donor AG Type: NEGATIVE

## 2023-04-21 LAB — BPAM RBC
Blood Product Expiration Date: 202407252359
Unit Type and Rh: 5100

## 2023-04-22 ENCOUNTER — Ambulatory Visit (HOSPITAL_COMMUNITY): Payer: Self-pay

## 2023-04-22 NOTE — Lactation Note (Signed)
This note was copied from a baby's chart. Lactation Consultation Note  Patient Name: Sandra Matthews [id number] Date: 04/22/2023 Age:28 days Reason for consult: Follow-up assessment;Infant weight loss;Breastfeeding assistance (6 % weight loss) As LC entered the room mom was starting to latch the baby and only needed minimal assist to flange the upper lip for increased depth. Multiple swallows noted and increased with breast compressions. Baby fed for 8 mins and released.Latch score 8  LC reviewed the BF plan:  Feed with feeding cues and by 3 hours plan to feed the baby.  Offer the breast and take the baby to the breast calm.  If baby is to fussy to latch, offer an appetizer from the bottle 5-10 ml and then latch, supplement with at least 30 ml after the baby feeds at the breast.  Post pump both breast for 15 mins , save milk for the next feeding.  If baby doesn't latch , feed 30 - 45 ml or greater .  Mom doesn't qualify for [facility] program . Mom aware LC will sent a WIC to [facility] today and she plans on obtaining a DEBP .    Maternal Data Has patient been taught Hand Expression?: Yes  Feeding Mother's Current Feeding Choice: Breast Milk and Formula Nipple Type: Dr. Levert Feinstein Preemie  LATCH Score Latch: Grasps breast easily, tongue down, lips flanged, rhythmical sucking.  Audible Swallowing: Spontaneous and intermittent  Type of Nipple: Everted at rest and after stimulation  Comfort (Breast/Nipple): Filling, red/small blisters or bruises, mild/mod discomfort  Hold (Positioning): Assistance needed to correctly position infant at breast and maintain latch.  LATCH Score: 8   Lactation Tools Discussed/Used Tools: Pump;Flanges Flange Size: 21 Breast pump type: Manual;Double-Electric Breast Pump Pump Education: Milk Storage;Setup, frequency, and cleaning Pumped volume: 53 mL  Interventions Interventions: Breast feeding basics reviewed;Hand pump;DEBP;Education;LC Services  brochure  Discharge Discharge Education: Engorgement and breast care;Warning signs for feeding baby;Other (comment) (per mom will be going to the [facility] center. LC recommended when at the [facility] appt tomorrow - check to see  for appt fro LC) Pump: Personal;Manual WIC Program: No  Consult Status Consult Status: Complete Date: 04/22/23    Matilde Sprang Raynelle Fujikawa 04/22/2023, 10:10 AM

## 2023-04-22 NOTE — Lactation Note (Signed)
This note was copied from a baby's chart. Lactation Consultation Note  Patient Name: Sandra Matthews [id number] Date: 04/22/2023 Age:28 days Reason for consult: Follow-up assessment;Early term 37-38.6wks;Other (Comment) (eat,sleep,console) F/u w/mom to see if she was BF any. When LC came into rm. Mom stated she was glad I came, her breast was leaking ans she thought she would BF him. Baby was fussy not wanting to latch. Attempted several different positions and he was refusing the breast. Baby isn't taking in much volume.mom stated she tries but he won't take much. Asked mom she has she pumped last mom stated she hadn't hardly pumped and it had been days since she pumped. Mom stated she thought she might should pump because her breast were hurting. LC got mom pumping and mom stated it felt much better. LC massaged one breast am massaged the other. Mom pumped 53 ml BM. Praised mom. Explained how important it is for the baby to get her BM it will help with his with drawls.  Noted baby sneezing a lot, fussy. Mom stated she doesn't sleep but about 20 minutes unless she holds him while he sleeps. Encouraged mom to pump at least every three hours. Sooner if needed.  Give the baby her BM first then formula.   Feeding plan: Mom pump every 3 hrs. Give baby her BM and then formula to equal the amount the baby needs.  Hold baby STS as much as possible.   Maternal Data    Feeding Mother's Current Feeding Choice: Breast Milk and Formula Nipple Type: Nfant Slow Flow (purple)  LATCH Score Latch: Too sleepy or reluctant, no latch achieved, no sucking elicited.  Audible Swallowing: None  Type of Nipple: Everted at rest and after stimulation  Comfort (Breast/Nipple): Engorged, cracked, bleeding, large blisters, severe discomfort  Hold (Positioning): Full assist, staff holds infant at breast  LATCH Score: 2   Lactation Tools Discussed/Used Tools: Pump Breast pump type: Double-Electric  Breast Pump Reason for Pumping: supplementation Pumping frequency: q 3hr Pumped volume: 53 mL  Interventions Interventions: Breast feeding basics reviewed;Adjust position;DEBP;Assisted with latch;Support pillows;Skin to skin;Position options;Education;Breast massage;Expressed milk;Pace feeding;Hand express;Breast compression  Discharge    Consult Status Consult Status: Follow-up Date: 04/23/23 Follow-up type: In-patient    Sandra Matthews, Diamond Nickel 04/22/2023, 2:57 AM

## 2023-04-23 ENCOUNTER — Telehealth: Payer: Self-pay | Admitting: Family Medicine

## 2023-04-23 NOTE — Telephone Encounter (Signed)
Called patient at number listed in chart--patient could not be reached at that number at the time. Attempted to call patient at other number listed in chart--[phone]--verified patient with full name and DOB.   Patient requesting refill on suboxone; routing message to Dr. Crissie Reese.   Maureen Ralphs RN on 04/23/23 at [phone]

## 2023-04-23 NOTE — Telephone Encounter (Signed)
Attempted to call patient, no answer.  Happy to send refill but not totally clear to me what dose she is taking at present, need to clarify this first before I send prescription. Please try to contact the patient again later today.  She should be scheduled for follow up, next 1-2 weeks, needs UDS at that time.

## 2023-04-23 NOTE — Telephone Encounter (Signed)
Patient called in regarding her suboxone, informed patient I will send a message over to the clinical team.

## 2023-04-25 ENCOUNTER — Other Ambulatory Visit (HOSPITAL_COMMUNITY): Payer: Self-pay

## 2023-05-04 ENCOUNTER — Other Ambulatory Visit: Payer: Self-pay | Admitting: Advanced Practice Midwife

## 2023-05-05 ENCOUNTER — Telehealth: Payer: Self-pay | Admitting: Pediatrics
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
## 2023-05-06 ENCOUNTER — Ambulatory Visit (INDEPENDENT_AMBULATORY_CARE_PROVIDER_SITE_OTHER): Payer: MEDICAID | Admitting: Family Medicine

## 2023-05-06 ENCOUNTER — Encounter: Payer: Self-pay | Admitting: Family Medicine

## 2023-05-06 ENCOUNTER — Other Ambulatory Visit (HOSPITAL_COMMUNITY): Payer: Self-pay

## 2023-05-06 ENCOUNTER — Other Ambulatory Visit: Payer: Self-pay

## 2023-05-06 VITALS — BP 130/85 | HR 85 | Ht 62.0 in | Wt 127.0 lb

## 2023-05-06 DIAGNOSIS — F1190 Opioid use, unspecified, uncomplicated: Secondary | ICD-10-CM | POA: Diagnosis not present

## 2023-05-06 MED ORDER — BUPRENORPHINE HCL-NALOXONE HCL 8-2 MG SL SUBL
1.0000 | SUBLINGUAL_TABLET | Freq: Two times a day (BID) | SUBLINGUAL | 0 refills | Status: DC
Start: 2023-05-06 — End: 2023-05-06
  Filled 2023-05-06 (×4): qty 32, 16d supply, fill #0

## 2023-05-06 MED ORDER — BUPRENORPHINE HCL-NALOXONE HCL 8-2 MG SL SUBL
1.0000 | SUBLINGUAL_TABLET | Freq: Two times a day (BID) | SUBLINGUAL | 0 refills | Status: DC
Start: 2023-05-06 — End: 2023-05-15
  Filled 2023-05-06: qty 32, 16d supply, fill #0
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
## 2023-05-06 NOTE — Addendum Note (Signed)
Addended by: Merian Capron on: 05/06/2023 02:18 PM   Modules accepted: Orders
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
## 2023-05-11 LAB — TOXASSURE FLEX 15, UR
6-ACETYLMORPHINE IA: NEGATIVE ng/mL
7-aminoclonazepam: NOT DETECTED ng/mg creat
AMPHETAMINES IA: NEGATIVE ng/mL
Alpha-hydroxyalprazolam: NOT DETECTED ng/mg creat
Alpha-hydroxymidazolam: NOT DETECTED ng/mg creat
Alpha-hydroxytriazolam: NOT DETECTED ng/mg creat
Alprazolam: NOT DETECTED ng/mg creat
BARBITURATES IA: NEGATIVE ng/mL
BUPRENORPHINE: POSITIVE
Benzodiazepines: NEGATIVE
Buprenorphine: 38 ng/mg creat
CANNABINOIDS IA: NEGATIVE ng/mL
COCAINE METABOLITE IA: NEGATIVE ng/mL
Clonazepam: NOT DETECTED ng/mg creat
Creatinine: 116 mg/dL
Desalkylflurazepam: NOT DETECTED ng/mg creat
Desmethyldiazepam: NOT DETECTED ng/mg creat
Desmethylflunitrazepam: NOT DETECTED ng/mg creat
Diazepam: NOT DETECTED ng/mg creat
ETHYL ALCOHOL Enzymatic: NEGATIVE g/dL
FENTANYL: NEGATIVE
Fentanyl: NOT DETECTED ng/mg creat
Flunitrazepam: NOT DETECTED ng/mg creat
Lorazepam: NOT DETECTED ng/mg creat
METHADONE IA: NEGATIVE ng/mL
METHADONE MTB IA: NEGATIVE ng/mL
Midazolam: NOT DETECTED ng/mg creat
Norbuprenorphine: 355 ng/mg creat
Norfentanyl: NOT DETECTED ng/mg creat
OPIATE CLASS IA: NEGATIVE ng/mL
Oxazepam: NOT DETECTED ng/mg creat
PHENCYCLIDINE IA: NEGATIVE ng/mL
TAPENTADOL, IA: NEGATIVE ng/mL
TRAMADOL IA: NEGATIVE ng/mL
Temazepam: NOT DETECTED ng/mg creat

## 2023-05-11 LAB — OXYCODONE CLASS, MS, UR RFX
Noroxycodone: 46 ng/mg creat
Noroxymorphone: NOT DETECTED ng/mg creat
Oxycodone Class Confirmation: POSITIVE
Oxycodone: NOT DETECTED ng/mg creat
Oxymorphone: 396 ng/mg creat

## 2023-05-14 ENCOUNTER — Other Ambulatory Visit (HOSPITAL_COMMUNITY): Payer: Self-pay

## 2023-05-15 ENCOUNTER — Ambulatory Visit (INDEPENDENT_AMBULATORY_CARE_PROVIDER_SITE_OTHER): Payer: MEDICAID | Admitting: Family Medicine

## 2023-05-15 ENCOUNTER — Other Ambulatory Visit (HOSPITAL_COMMUNITY): Payer: Self-pay

## 2023-05-15 ENCOUNTER — Other Ambulatory Visit: Payer: Self-pay

## 2023-05-15 VITALS — BP 131/84 | HR 87 | Wt 128.8 lb

## 2023-05-15 DIAGNOSIS — F1190 Opioid use, unspecified, uncomplicated: Secondary | ICD-10-CM | POA: Diagnosis not present

## 2023-05-15 MED ORDER — BUPRENORPHINE HCL-NALOXONE HCL 8-2 MG SL FILM
1.0000 | ORAL_FILM | Freq: Three times a day (TID) | SUBLINGUAL | 0 refills | Status: DC
Start: 2023-05-15 — End: 2023-06-12
  Filled 2023-05-15: qty 90, 30d supply, fill #0
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
## 2023-05-15 NOTE — Assessment & Plan Note (Signed)
Seems to be doing much better than last visit. Feels like dose increase would be beneficial and would like to switch back from tablets to films. Increase to 8 mg TID, new rx sent though she probably won't be able to fill it until next week. UDS collected. See back in 4 weeks.

## 2023-05-20 ENCOUNTER — Encounter: Payer: MEDICAID | Admitting: Family Medicine

## 2023-05-22 DIAGNOSIS — Z419 Encounter for procedure for purposes other than remedying health state, unspecified: Secondary | ICD-10-CM | POA: Diagnosis not present

## 2023-05-26 ENCOUNTER — Ambulatory Visit: Payer: Medicaid Other | Admitting: Obstetrics and Gynecology

## 2023-05-28 ENCOUNTER — Ambulatory Visit: Payer: Medicaid Other | Admitting: Obstetrics and Gynecology

## 2023-06-11 ENCOUNTER — Other Ambulatory Visit (HOSPITAL_COMMUNITY): Payer: Self-pay

## 2023-06-11 ENCOUNTER — Other Ambulatory Visit: Payer: Self-pay | Admitting: Family Medicine

## 2023-06-11 DIAGNOSIS — F1190 Opioid use, unspecified, uncomplicated: Secondary | ICD-10-CM

## 2023-06-12 ENCOUNTER — Encounter: Payer: Self-pay | Admitting: Family Medicine

## 2023-06-12 ENCOUNTER — Other Ambulatory Visit (HOSPITAL_COMMUNITY): Payer: Self-pay

## 2023-06-12 ENCOUNTER — Ambulatory Visit (INDEPENDENT_AMBULATORY_CARE_PROVIDER_SITE_OTHER): Payer: Medicaid Other | Admitting: Family Medicine

## 2023-06-12 DIAGNOSIS — F17200 Nicotine dependence, unspecified, uncomplicated: Secondary | ICD-10-CM

## 2023-06-12 DIAGNOSIS — F1190 Opioid use, unspecified, uncomplicated: Secondary | ICD-10-CM | POA: Diagnosis not present

## 2023-06-12 DIAGNOSIS — Z7689 Persons encountering health services in other specified circumstances: Secondary | ICD-10-CM | POA: Diagnosis not present

## 2023-06-12 MED ORDER — NICOTINE 21-14-7 MG/24HR TD KIT
1.0000 | PACK | Freq: Once | TRANSDERMAL | 0 refills | Status: DC
Start: 2023-06-12 — End: 2023-06-12
  Filled 2023-06-12: qty 1, 1d supply, fill #0

## 2023-06-12 MED ORDER — BUPRENORPHINE HCL-NALOXONE HCL 8-2 MG SL FILM
1.0000 | ORAL_FILM | Freq: Three times a day (TID) | SUBLINGUAL | 0 refills | Status: DC
Start: 2023-06-12 — End: 2023-07-28
  Filled 2023-06-12: qty 90, 30d supply, fill #0

## 2023-06-12 MED ORDER — NICOTINE 21 MG/24HR TD PT24
21.0000 mg | MEDICATED_PATCH | Freq: Every day | TRANSDERMAL | 0 refills | Status: DC
  Filled 2023-06-12: qty 28, 28d supply, fill #0

## 2023-06-12 MED ORDER — NICOTINE 7 MG/24HR TD PT24
7.0000 mg | MEDICATED_PATCH | Freq: Every day | TRANSDERMAL | 0 refills | Status: DC
  Filled 2023-06-12: qty 14, 14d supply, fill #0

## 2023-06-12 MED ORDER — NICOTINE 14 MG/24HR TD PT24
14.0000 mg | MEDICATED_PATCH | Freq: Every day | TRANSDERMAL | 0 refills | Status: DC
  Filled 2023-06-12: qty 14, 14d supply, fill #0

## 2023-06-12 NOTE — Patient Instructions (Addendum)
Dentist Information Links [facility];More than 7 dentists are reported as practicing at this location.  [address]  [place], [place] [address]  Unknown If Accepting New Patients Specialty: General Dentistry, Pediatric Dentistry Special Needs: Yes Dentist Affiliation: Private Practice Last Update Date: 05/23/2023 Get driving directions View on Map [phone] [facility] [place], [place] MICHAEL  [address]  [place], [place] [address]  Accepts New Patients Specialty: General Dentistry Special Needs: Yes Dentist Affiliation: Private Practice Last Update Date: 05/23/2023 Get driving directions View on Map [phone] DR Memory Dance DDS PA LONG Burr Medico ANITA  [address] [place], [place] [address]  Accepts New Patients Specialty: General Dentistry Special Needs: No Dentist Affiliation: Private Practice Last Update Date: 05/23/2023 Get driving directions View on Map [phone] [facility] Vevelyn Pat, Misty Stanley ANDREW  [address]  [place], [place] [address]  Accepts New Patients Specialty: General Dentistry Special Needs: Yes Dentist Affiliation: Private Practice Last Update Date: 05/23/2023 Get driving directions View on Map [phone] [facility] SILVA Tye [place]  [address] [place], [place] [address]  Accepts New Patients Specialty: General Dentistry Special Needs: No Dentist Affiliation: Private Practice Last Update Date: 05/23/2023 Get driving directions View on Map [phone] HA NGUYEN SILVA [employer] [place] NGUYEN  [address] [place], [place] [address]  Accepts New Patients Specialty: General Dentistry Special Needs: No Dentist Affiliation: Private Practice Last Update Date: 05/23/2023 Get driving directions View on Map [phone] [facility] I ARTIS DDS PA Vernie Murders  [address]  [place], [place] [address]  Not Accepting New Patients Specialty: General Dentistry Special Needs: Yes Dentist Affiliation: Private Practice Last Update Date: 05/23/2023 Get driving directions View on Map [phone] JEFFRIES L PERRY [address]  [place], [place] [address]  Unknown If Accepting New Patients Specialty: General Dentistry, Pediatric Dentistry Special Needs: Yes Dentist Affiliation: Private Practice Last Update Date: 05/23/2023 Get driving directions View on Map [phone] [facility] ST[facility]TERS [facility] STARTERS;More than 7 dentists are reported as practicing at this location.  [address]  [place], [place] [address]  Accepts New Patients Specialty: General Dentistry Special Needs: Yes Dentist Affiliation: Private Practice Last Update Date: 05/23/2023 Get driving directions View on Map [phone] DENTISTRY [address]  [place], [place] [address]  Unknown If Accepting New Patients Specialty: General Dentistry Special Needs: No Dentist Affiliation: Private Practice Last Update Date: 05/23/2023 Get driving directions View on Map [phone] [facility] [place] [facility] DDS PA Geryl Councilman RAY  [address]  [place], [place] [address]  Not Accepting New Patients Specialty: General Dentistry Special Needs: No Dentist Affiliation: Private Practice Last Update Date: 05/23/2023 Get driving directions View on Map [phone] [facility] DMD PA Neena Rhymes  [address]  [place], [place] [address]  Not Accepting New Patients Specialty: Oral and Maxillofacial Surgery Special Needs: No Dentist Affiliation: Private Practice Last Update Date: 05/23/2023 Get driving directions View on Map [phone] [facility] Ocie Doyne [facility]  [address]  [place], [place] [address]  Not Accepting New Patients Specialty: Oral and Maxillofacial Surgery Special  Needs: Yes Dentist Affiliation: Private Practice Last Update Date: 05/23/2023 Get driving directions View on Map [phone] MYORTHODONTIST [address]  [place], [place] [address]  Unknown If Accepting New Patients Specialty: Orthodontics and Dentofacial Orthopedics Special Needs: No Dentist Affiliation: Private Practice Last Update Date: 05/23/2023 Get driving directions View on Map [phone] [facility] PERENTISDDS PA  [address]  [place], [place] [address]  Accepts New Patients Specialty: General Dentistry Special Needs: No Dentist Affiliation: Private Practice Last Update Date: 05/23/2023 Get driving directions View on Map [phone] [address]  [place], [place] [address]  Unknown If Accepting New Patients Specialty: General Dentistry Special Needs: Yes Dentist Affiliation: Health Department Last Update Date: 05/23/2023 Get driving directions View on Map [phone] [facility] ORTHODONTICS [address]  [place], [place] [address]  Unknown If Accepting New Patients Specialty: General Dentistry, Orthodontics and Dentofacial Orthopedics Special Needs: Yes Dentist Affiliation: Private Practice Last Update Date: 05/23/2023 Get driving directions View on Map [phone] Lazarus Gowda [facility] DDS MS PA [address]  [place], [place] [address]  Unknown If Accepting New Patients Specialty: Orthodontics and Dentofacial Orthopedics Special Needs: No Dentist Affiliation: Private Practice Last Update Date: 05/23/2023 Get driving directions View on Map [phone] [facility] Jolene Provost [facility]  [address]  [place], [place] [address]  Not Accepting New Patients Specialty: Endodontics Special Needs: Yes Dentist Affiliation: Private Practice Last Update Date: 05/23/2023 Get driving directions View on Map [phone] [facility] COOPER DDS PA Leanne Lovely  [facility]  [address]  [address], [place] [address]  Not Accepting New Patients Specialty: General Dentistry Special Needs: No Dentist Affiliation: Private Practice Last Update Date: 05/23/2023 Get driving directions View on Map [phone] Faylene Million DDS PA Assunta Found DAVID  [address] Lynnette Caffey  [place], [place] [address]  Not Accepting New Patients Specialty: General Dentistry Special Needs: No Dentist Affiliation: Private Practice Last Update Date: 05/23/2023 Get driving directions View on Map [phone] [facility] [address] Delena Serve  [place], [place] [address]  Accepts New Patients Specialty: General Dentistry Special Needs: Yes Dentist Affiliation: Private Practice Last Update Date: 05/23/2023 Get driving directions View on Map [phone] Colbert Ewing DMD PA HISAW, THANE CURTIS  [address]  [place], [place] [address]  Accepts New Patients Specialty: Pediatric Dentistry Special Needs: Yes Dentist Affiliation: Private Practice Last Update Date: 05/23/2023 Get driving directions View on Map [phone] [facility]MD [facility] Shirley Friar THAI  [address] [place], [place] [address]  Accepts New Patients Specialty: General Dentistry Special Needs: No Dentist Affiliation: Private Practice Last Update Date: 05/23/2023 Get driving directions View on Map [phone] [facility] DESIGN [address]  [place], [place] [address]  Accepts New Patients Specialty: Orthodontics and Dentofacial Orthopedics Special Needs: Yes Dentist Affiliation: Private Practice Last Update Date: 05/23/2023 Get driving directions View on Map [phone]

## 2023-06-12 NOTE — Progress Notes (Signed)
Post Partum Visit Note  Sandra Matthews is a 27 y.o. [phone] female who presents for a postpartum visit. She is 8 weeks postpartum following a normal spontaneous vaginal delivery.  I have fully reviewed the prenatal and intrapartum course. The delivery was at 37 gestational weeks.  Anesthesia: none. Postpartum course has been uneventful. Baby is doing well. Baby is feeding by bottle - Similac Neosure. Bleeding staining only. Bowel function is  abnormal (constipation) . Bladder function is normal. Patient is sexually active. Contraception method is Nexplanon. Postpartum depression screening: negative.   Upstream - 06/12/23 1429       Pregnancy Intention Screening   Does the patient want to become pregnant in the next year? No    Does the patient's partner want to become pregnant in the next year? No    Would the patient like to discuss contraceptive options today? No      Contraception Wrap Up   Current Method Hormonal Implant            The pregnancy intention screening data noted above was reviewed. Potential methods of contraception were discussed. The patient elected to proceed with No data recorded.   Edinburgh Postnatal Depression Scale - 06/12/23 1428       Edinburgh Postnatal Depression Scale:  In the Past 7 Days   I have been able to laugh and see the funny side of things. 0    I have looked forward with enjoyment to things. 0    I have blamed myself unnecessarily when things went wrong. 0    I have been anxious or worried for no good reason. 0    I have felt scared or panicky for no good reason. 0    Things have been getting on top of me. 0    I have been so unhappy that I have had difficulty sleeping. 0    I have been so unhappy that I have been crying. 0    The thought of harming myself has occurred to me. 0             Health Maintenance Due  Topic Date Due   COVID-19 Vaccine (1 - 2023-24 season) Never done   INFLUENZA VACCINE  05/22/2023    The  following portions of the patient's history were reviewed and updated as appropriate: allergies, current medications, past family history, past medical history, past social history, past surgical history, and problem list.  Review of Systems Pertinent items noted in HPI and remainder of comprehensive ROS otherwise negative.  Objective:  BP (!) 139/96   Pulse 75   Wt 132 lb 3.2 oz (60 kg)   BMI 24.18 kg/m    General:  alert, cooperative, and appears stated age   Breasts:  not indicated  Lungs: Comfortalbe on room air  Wound N/a  GU exam:  not indicated        Assessment:   Encounter for postpartum visit [Z39.2]  Opioid use disorder - Plan: ToxASSURE Select 13 (MW), Urine, Buprenorphine HCl-Naloxone HCl (SUBOXONE) 8-2 MG FILM  Tobacco use disorder - Plan: Nicotine 21-14-7 MG/24HR KIT   Normal postpartum exam.   Plan:   Essential components of care per ACOG recommendations:  1.  Mood and well being: Patient with negative depression screening today. Reviewed local resources for support.  - Patient tobacco use? Yes. Patient desires to quit? Yes.offered nicotine replacement therapy which was accepted.  - hx of drug use? Yes. Discussed support systems and outpatient/inpatient  treatment options.    2. Infant care and feeding:  -Patient currently breastmilk feeding? No.  -Social determinants of health (SDOH) reviewed in EPIC. No concerns  3. Sexuality, contraception and birth spacing - Patient does not want a pregnancy in the next year.  Desired family size is 5 children.  - Reviewed reproductive life planning. Reviewed contraceptive methods based on pt preferences and effectiveness.  Patient had Nexplanon placed after delivery while still in hospital.   - Discussed birth spacing of 18 months  4. Sleep and fatigue -Encouraged family/partner/community support of 4 hrs of uninterrupted sleep to help with mood and fatigue  5. Physical Recovery  - Discussed patients delivery and  complications. She describes her labor as good. - Patient had a Vaginal, no problems at delivery. Patient had no laceration. Perineal healing reviewed. Patient expressed understanding - Patient has urinary incontinence? No. - Patient is safe to resume physical and sexual activity  6.  Health Maintenance - HM due items addressed No - up to date - Last pap smear  Diagnosis  Date Value Ref Range Status  11/13/2022      - Negative for Intraepithelial Lesions or Malignancy (NILM)  11/13/2022 - Benign reactive/reparative changes     Pap smear not done at today's visit.  -Breast Cancer screening indicated? No.   7. Chronic Disease/Pregnancy Condition follow up:  OUD  #OUD Last seen 05/15/2023, doing well overall but increased to Suboxone 8 TID at that visit. UDS at that time was appropriate.  Today reports she had some tooth pain and took a pain pill. Discussed she should call me in these cases so we can find a solution. Given list of dental clinics that accept medicaid. Refill sent, will follow up on UDS today.    Venora Maples, MD/MPH Attending Family Medicine Physician, [facility] for [facility], [facility]

## 2023-06-22 DIAGNOSIS — Z419 Encounter for procedure for purposes other than remedying health state, unspecified: Secondary | ICD-10-CM | POA: Diagnosis not present

## 2023-07-11 ENCOUNTER — Telehealth: Payer: Self-pay | Admitting: Pediatrics

## 2023-07-11 NOTE — Telephone Encounter (Signed)
NAS postpartum consult complete.  Mom reports she and baby are doing well and he is almost 39 months old.  She is feeling well and working to return to her home (currently living with her mom).  No needs at this time.  Will follow again on 10/21.

## 2023-07-17 ENCOUNTER — Ambulatory Visit: Payer: Medicaid Other | Admitting: Family Medicine

## 2023-07-22 DIAGNOSIS — Z419 Encounter for procedure for purposes other than remedying health state, unspecified: Secondary | ICD-10-CM | POA: Diagnosis not present

## 2023-07-23 ENCOUNTER — Other Ambulatory Visit (HOSPITAL_COMMUNITY): Payer: Self-pay

## 2023-07-28 ENCOUNTER — Other Ambulatory Visit: Payer: Medicaid Other

## 2023-07-28 ENCOUNTER — Other Ambulatory Visit: Payer: Self-pay | Admitting: Family Medicine

## 2023-07-28 ENCOUNTER — Other Ambulatory Visit: Payer: Self-pay

## 2023-07-28 ENCOUNTER — Other Ambulatory Visit (HOSPITAL_COMMUNITY): Payer: Self-pay

## 2023-07-28 DIAGNOSIS — F1190 Opioid use, unspecified, uncomplicated: Secondary | ICD-10-CM

## 2023-07-28 MED ORDER — BUPRENORPHINE HCL-NALOXONE HCL 8-2 MG SL FILM
1.0000 | ORAL_FILM | Freq: Three times a day (TID) | SUBLINGUAL | 0 refills | Status: DC
Start: 2023-07-28 — End: 2023-08-02
  Filled 2023-07-28: qty 15, 5d supply, fill #0

## 2023-07-28 NOTE — Addendum Note (Signed)
Addended by: Isabell Jarvis on: 07/28/2023 11:58 AM   Modules accepted: Orders

## 2023-07-28 NOTE — Progress Notes (Signed)
Notified by clinical staff that patient is at clinic (I am currently at [facility]) and requesting refill.  Has not been seen since 06/12/2023, at that time did not leave urine. Given 30 day rx of suboxone, theoretically has run out since then as she is almost 4 weeks past needing a refill. UDS prior to that visit in 04/2023 was appropriate with normal metabolite concentrations. Will give refill until the end of the week, patient has appt to see me in three days.

## 2023-07-28 NOTE — Progress Notes (Signed)
Error encounter. 

## 2023-07-30 NOTE — BH Specialist Note (Signed)
Integrated Behavioral Health Initial In-Person Visit  MRN: [id number] Name: Sandra Matthews  Number of Integrated Behavioral Health Clinician visits: 1- Initial Visit  Session Start time: 1703    Session End time: 1718  Total time in minutes: 15   Types of Service: Introduction only/ Brief check-in REACH  Interpretor:No. Interpretor Name and Language: n/a   Warm Hand Off Completed.        Subjective: Sandra Matthews is a 27 y.o. female accompanied by  n/a Patient was referred by Merian Capron, MD for [facility] Brief check-in/Reach.  Pt requests information regarding [facility] Severin for help obtaining furniture; agrees to virtual consult appointment on 08/18/23 at 10:45am; will call [facility] as needed prior to that time at [phone].    Rae Lips, LCSW     07/31/2023    5:28 PM 06/12/2023    2:26 PM 02/10/2023    4:11 PM 11/13/2022   10:27 AM 01/17/2020    9:10 AM  Depression screen PHQ 2/9  Decreased Interest 1 0 2 0 0  Down, Depressed, Hopeless 3 0 1 0 0  PHQ - 2 Score 4 0 3 0 0  Altered sleeping 3 0 3 3 0  Tired, decreased energy 1 0 3 1 1   Change in appetite 0 0 1 0 1  Feeling bad or failure about yourself  1 0 1 0 0  Trouble concentrating 0 0 0 0 0  Moving slowly or fidgety/restless 0 0 0 0 0  Suicidal thoughts 0 0 0 0 0  PHQ-9 Score 9 0 11 4 2   Difficult doing work/chores     Not difficult at all      07/31/2023    5:29 PM 06/12/2023    2:27 PM 02/10/2023    4:15 PM 11/13/2022   10:31 AM  GAD 7 : Generalized Anxiety Score  Nervous, Anxious, on Edge 2 0 1 0  Control/stop worrying 3 0 3 0  Worry too much - different things 3 0 1 0  Trouble relaxing 3 0 1 0  Restless 3 0 0 1  Easily annoyed or irritable 3 0 3 1  Afraid - awful might happen 1 0 1 0  Total GAD 7 Score 18 0 10 2

## 2023-07-31 ENCOUNTER — Other Ambulatory Visit (HOSPITAL_COMMUNITY): Payer: Self-pay

## 2023-07-31 ENCOUNTER — Telehealth: Payer: Self-pay

## 2023-07-31 ENCOUNTER — Ambulatory Visit (INDEPENDENT_AMBULATORY_CARE_PROVIDER_SITE_OTHER): Payer: Medicaid Other | Admitting: Family Medicine

## 2023-07-31 ENCOUNTER — Ambulatory Visit: Payer: Self-pay | Admitting: Clinical

## 2023-07-31 ENCOUNTER — Encounter: Payer: Self-pay | Admitting: Family Medicine

## 2023-07-31 VITALS — BP 121/85 | HR 110 | Wt 143.7 lb

## 2023-07-31 DIAGNOSIS — F32A Depression, unspecified: Secondary | ICD-10-CM | POA: Diagnosis not present

## 2023-07-31 DIAGNOSIS — Z658 Other specified problems related to psychosocial circumstances: Secondary | ICD-10-CM

## 2023-07-31 DIAGNOSIS — F17200 Nicotine dependence, unspecified, uncomplicated: Secondary | ICD-10-CM | POA: Diagnosis not present

## 2023-07-31 DIAGNOSIS — F1190 Opioid use, unspecified, uncomplicated: Secondary | ICD-10-CM

## 2023-07-31 DIAGNOSIS — F419 Anxiety disorder, unspecified: Secondary | ICD-10-CM | POA: Diagnosis not present

## 2023-07-31 LAB — TOXASSURE FLEX 15, UR
6-ACETYLMORPHINE IA: NEGATIVE ng/mL
7-aminoclonazepam: NOT DETECTED ng/mg{creat}
AMPHETAMINES IA: NEGATIVE ng/mL
Alpha-hydroxyalprazolam: 57 ng/mg{creat}
Alpha-hydroxymidazolam: NOT DETECTED ng/mg{creat}
Alpha-hydroxytriazolam: NOT DETECTED ng/mg{creat}
Alprazolam: NOT DETECTED ng/mg{creat}
BARBITURATES IA: NEGATIVE ng/mL
BUPRENORPHINE: POSITIVE
Benzodiazepines: POSITIVE
Buprenorphine: 167 ng/mg{creat}
Clonazepam: NOT DETECTED ng/mg{creat}
Creatinine: 124 mg/dL
Desalkylflurazepam: NOT DETECTED ng/mg{creat}
Desmethyldiazepam: NOT DETECTED ng/mg{creat}
Desmethylflunitrazepam: NOT DETECTED ng/mg{creat}
Diazepam: NOT DETECTED ng/mg{creat}
ETHYL ALCOHOL Enzymatic: NEGATIVE g/dL
FENTANYL: NEGATIVE
Fentanyl: NOT DETECTED ng/mg{creat}
Flunitrazepam: NOT DETECTED ng/mg{creat}
Lorazepam: NOT DETECTED ng/mg{creat}
METHADONE IA: NEGATIVE ng/mL
METHADONE MTB IA: NEGATIVE ng/mL
Midazolam: NOT DETECTED ng/mg{creat}
Norbuprenorphine: >806 ng/mg{creat}
Norfentanyl: NOT DETECTED ng/mg{creat}
Oxazepam: NOT DETECTED ng/mg{creat}
PHENCYCLIDINE IA: NEGATIVE ng/mL
TAPENTADOL, IA: NEGATIVE ng/mL
TRAMADOL IA: NEGATIVE ng/mL
Temazepam: NOT DETECTED ng/mg{creat}

## 2023-07-31 LAB — OPIATE CLASS, MS, UR RFX
Codeine: NOT DETECTED ng/mg{creat}
Dihydrocodeine: NOT DETECTED ng/mg{creat}
Hydrocodone: NOT DETECTED ng/mg{creat}
Hydromorphone: 53 ng/mg{creat}
Morphine: NOT DETECTED ng/mg{creat}
Norcodeine: NOT DETECTED ng/mg{creat}
Norhydrocodone: NOT DETECTED ng/mg{creat}
Normorphine: NOT DETECTED ng/mg{creat}
Opiate Class Confirmation: POSITIVE

## 2023-07-31 LAB — OXYCODONE CLASS, MS, UR RFX
Noroxycodone: NOT DETECTED ng/mg{creat}
Noroxymorphone: NOT DETECTED ng/mg{creat}
Oxycodone Class Confirmation: POSITIVE
Oxycodone: NOT DETECTED ng/mg{creat}
Oxymorphone: 113 ng/mg{creat}

## 2023-07-31 LAB — COCAINE AND MTB, MS, UR RFX
Benzoylecgonine: 132 ng/mg{creat}
Cocaethylene: NOT DETECTED ng/mg{creat}
Cocaine Confirmation: POSITIVE
Cocaine: NOT DETECTED ng/mg{creat}

## 2023-07-31 LAB — CANNABINOIDS, MS, UR RFX
Cannabinoids Confirmation: POSITIVE
Carboxy-THC: 115 ng/mg{creat}

## 2023-07-31 MED ORDER — NICOTINE 7 MG/24HR TD PT24: 7.0000 mg | MEDICATED_PATCH | Freq: Every day | TRANSDERMAL | 0 refills | Status: DC

## 2023-07-31 MED ORDER — NICOTINE POLACRILEX 2 MG MT GUM: 2.0000 mg | CHEWING_GUM | OROMUCOSAL | 0 refills | Status: DC | PRN

## 2023-07-31 MED ORDER — FLUOXETINE HCL 10 MG PO CAPS
10.0000 mg | ORAL_CAPSULE | Freq: Every day | ORAL | 1 refills | Status: DC
Start: 2023-07-31 — End: 2023-08-18

## 2023-07-31 MED ORDER — BUSPIRONE HCL 10 MG PO TABS
10.0000 mg | ORAL_TABLET | Freq: Three times a day (TID) | ORAL | 2 refills | Status: DC | PRN
Start: 2023-07-31 — End: 2023-11-06

## 2023-07-31 MED ORDER — NICOTINE 21 MG/24HR TD PT24: 21.0000 mg | MEDICATED_PATCH | Freq: Every day | TRANSDERMAL | 0 refills | Status: DC

## 2023-07-31 MED ORDER — HYDROXYZINE PAMOATE 25 MG PO CAPS
25.0000 mg | ORAL_CAPSULE | Freq: Three times a day (TID) | ORAL | 0 refills | Status: DC | PRN
Start: 2023-07-31 — End: 2023-08-14

## 2023-07-31 MED ORDER — BUPRENORPHINE HCL-NALOXONE HCL 8-2 MG SL FILM: 1.0000 | ORAL_FILM | Freq: Three times a day (TID) | SUBLINGUAL | 0 refills | Status: DC

## 2023-07-31 MED ORDER — NICOTINE 14 MG/24HR TD PT24: 14.0000 mg | MEDICATED_PATCH | Freq: Every day | TRANSDERMAL | 0 refills | Status: DC

## 2023-07-31 NOTE — Patient Instructions (Signed)
[facility] [address] [place], [place] [address] [phone] (main office) [phone] (Aquan Kope's office)   

## 2023-07-31 NOTE — Telephone Encounter (Signed)
Called pt regarding visit today. Pt states she will arrive around 4:15-4:30 PM. Currently waiting for her ride.

## 2023-07-31 NOTE — Assessment & Plan Note (Signed)
Left UDS. Very open with use, reports she has been using suboxone but anticipate other substances in UDS. Refill sent until next visit.

## 2023-07-31 NOTE — Patient Instructions (Addendum)
To treat your anxiety and depression we will start a few medicines.  You should take Prozac (Fluoxetine) daily. This is a combo anti-depressant and anti-anxiety medicine. It will not work immediately and we may need to go up on the dose. Take it daily.  You can take Vistaril (hydroxyzine) and Buspar (buspirone) as needed for anxiety symptoms.  I have also sent a refill of your suboxone to the pharmacy.

## 2023-07-31 NOTE — Assessment & Plan Note (Signed)
Significant symptoms contributing to her use disorders. Discussed benzo therapy is a terrible idea given OUD as well as problems with co administration of opioids and benzos. Recommended starting Prozac and using buspar/hydroxyzine PRN for anxiety. Also accepts IBH referral, warm hand off done to [place].

## 2023-07-31 NOTE — Assessment & Plan Note (Signed)
Lost last NRT meds I gave her, refills sent.

## 2023-07-31 NOTE — Progress Notes (Signed)
GYNECOLOGY OFFICE VISIT NOTE  History:   Sandra Matthews is a 27 y.o. [phone] here today for OUD follow up.  Reports she is not doing well Has been suffering with intense anxiety and depression Has tried zoloft in the past and didn't feel it help, doesn't want to try that medicine again Has been using illicits Acknowledges use of pain pills and cocaine Also used a xanax that a family member gave her and is wondering if she could start this to help with her anxiety  Health Maintenance Due  Topic Date Due   INFLUENZA VACCINE  05/22/2023   COVID-19 Vaccine (1 - 2023-24 season) Never done    Past Medical History:  Diagnosis Date   Anemia    Anxiety and depression 02/04/2023   Depression 09/27/2022   PP stuff, was thinking of calling and talking with someone   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   + THC 01/31/2020   Preterm labor    Supervision of other high risk pregnancy, antepartum 11/13/2022          Nursing Staff  Provider  Office Location  [facility]  Dating   05/08/2023, by Last Menstrual Period  [facility] Model  [X]  Traditional  [ ]  Centering  [ ]  Mom-Baby Dyad        Language   English  Anatomy US   Normal>IUGR  Flu Vaccine      Genetic/Carrier Screen   NIPS:   low risk female  AFP:   not done  Horizon: neg 14/14  TDaP Vaccine    Deferred on 02/04/23 (would like at next visit)   Hgb A1C or   GTT    Past Surgical History:  Procedure Laterality Date   FRACTURE SURGERY     broken arm in childhood, cast only, no surgery    The following portions of the patient's history were reviewed and updated as appropriate: allergies, current medications, past family history, past medical history, past social history, past surgical history and problem list.   Health Maintenance:   Last pap: Lab Results  Component Value Date   DIAGPAP  11/13/2022    - Negative for Intraepithelial Lesions or Malignancy (NILM)   DIAGPAP - Benign reactive/reparative changes 11/13/2022   HPVHIGH Positive (A) 08/01/2021     Last mammogram:  N/a    Review of Systems:  Pertinent items noted in HPI and remainder of comprehensive ROS otherwise negative.  Physical Exam:  BP 121/85   Pulse (!) 110   Wt 143 lb 11.2 oz (65.2 kg)   Breastfeeding No   BMI 26.28 kg/m  CONSTITUTIONAL: Well-developed, well-nourished female in no acute distress.  HEENT:  Normocephalic, atraumatic. External right and left ear normal. No scleral icterus.  NECK: Normal range of motion, supple, no masses noted on observation SKIN: No rash noted. Not diaphoretic. No erythema. No pallor. MUSCULOSKELETAL: Normal range of motion. No edema noted. NEUROLOGIC: Alert and oriented to person, place, and time. Normal muscle tone coordination.  PSYCHIATRIC: sad affect, depressed mood RESPIRATORY: Effort normal, no problems with respiration noted   Labs and Imaging No results found for this or any previous visit (from the past 168 hour(s)). No results found.    Assessment and Plan:   Problem List Items Addressed This Visit       Other   Anxiety and depression    Significant symptoms contributing to her use disorders. Discussed benzo therapy is a terrible idea given OUD as well as problems with co administration of opioids and  benzos. Recommended starting Prozac and using buspar/hydroxyzine PRN for anxiety. Also accepts IBH referral, warm hand off done to [name].       Relevant Medications   FLUoxetine (PROZAC) 10 MG capsule   hydrOXYzine (VISTARIL) 25 MG capsule   busPIRone (BUSPAR) 10 MG tablet   Opioid use disorder - Primary    Left UDS. Very open with use, reports she has been using suboxone but anticipate other substances in UDS. Refill sent until next visit.       Relevant Medications   Buprenorphine HCl-Naloxone HCl (SUBOXONE) 8-2 MG FILM   Other Relevant Orders   ToxAssure Flex 15, Ur   Tobacco use disorder    Lost last NRT meds I gave her, refills sent.       Relevant Medications   nicotine (NICODERM CQ - DOSED IN  MG/24 HOURS) 21 mg/24hr patch   nicotine (NICOTINE STEP 2) 14 mg/24hr patch   nicotine (NICODERM CQ - DOSED IN MG/24 HR) 7 mg/24hr patch   nicotine polacrilex (NICORETTE) 2 MG gum    Routine preventative health maintenance measures emphasized. Please refer to After Visit Summary for other counseling recommendations.   Return in about 2 weeks (around 08/14/2023) for REACH clinic.    Total face-to-face time with patient: 20 minutes.  Over 50% of encounter was spent on counseling and coordination of care.   Venora Maples, MD/MPH Attending Family Medicine Physician, [facility] for [facility], [facility]

## 2023-08-04 NOTE — BH Specialist Note (Signed)
Integrated Behavioral Health via Telemedicine Visit  08/18/2023 LEEANNE WHELAN [id number]  Number of Integrated Behavioral Health Clinician visits: 2- Second Visit  Session Start time: 1045   Session End time: 1159  Total time in minutes: 74   Referring Provider: Merian Capron, MD Patient/Family location: Home [facility] Provider location: Center for [facility]  All persons participating in visit: [name] and [facility] Sandra Matthews   Types of Service: Individual psychotherapy and Video visit  I connected with Sandra Matthews and/or Sandra Matthews's  n/a  via  Telephone or Video Enabled Telemedicine Application  (Video is Caregility application) and verified that I am speaking with the correct person using two identifiers. Discussed confidentiality: Yes   I discussed the limitations of telemedicine and the availability of in person appointments.  Discussed there is a possibility of technology failure and discussed alternative modes of communication if that failure occurs.  I discussed that engaging in this telemedicine visit, they consent to the provision of behavioral healthcare and the services will be billed under their insurance.  Patient and/or legal guardian expressed understanding and consented to Telemedicine visit: Yes   Presenting Concerns: Patient and/or family reports the following symptoms/concerns: Family making her feel bad about wanting to get help for depression and anxiety; overwhelming life stress and having a difficult time knowing where to start to address all of her stressors with little support. Pt's long-term goals are to obtain nursing degree, as well as provide an improved life for her children.  Duration of problem: Ongoing; Severity of problem:  moderately severe  Patient and/or Family's Strengths/Protective Factors: Concrete supports in place (healthy food, safe environments,  etc.) and Sense of purpose  Goals Addressed: Patient will:  Reduce symptoms of: anxiety, depression, and stress   Increase knowledge and/or ability of: self-management skills and stress reduction   Demonstrate ability to: Increase healthy adjustment to current life circumstances, Increase adequate support systems for patient/family, and Increase motivation to adhere to plan of care  Progress towards Goals: Ongoing  Interventions: Interventions utilized:  Mining engineer, Psychoeducation and/or Health Education, and Link to [employer] Standardized Assessments completed: Not Needed  Patient and/or Family Response: Patient agrees with treatment plan.   Assessment: Patient currently experiencing Major depressive disorder, recurrent, moderate; Generalized anxiety disorder; Psychosocial stressors.   Patient may benefit from continued therapeutic interventions .  Plan: Follow up with behavioral health clinician on : Three weeks Behavioral recommendations:  -Accept referral to psychiatry; consider using walk-in [facility] outpatient for quicker establishing initial appointment -Pick up notebook from [facility] front desk and Begin Worry Time strategy, as discussed. Start by setting up start and end time reminders on phone today; continue daily for three weeks. -Continue plan to utilize community resources as discussed (on After Visit Summary) Referral(s): Integrated Art gallery manager (In Clinic), Community Mental Health Services (LME/Outside Clinic), and Community Resources:  Finances, Transportation, and new mom support  I discussed the assessment and treatment plan with the patient and/or parent/guardian. They were provided an opportunity to ask questions and all were answered. They agreed with the plan and demonstrated an understanding of the instructions.   They were advised to call back or seek an in-person evaluation if the symptoms worsen or if the  condition fails to improve as anticipated.  Sandra Lips, LCSW     07/31/2023    5:28 PM 06/12/2023    2:26 PM 02/10/2023    4:11 PM 11/13/2022  10:27 AM 01/17/2020    9:10 AM  Depression screen PHQ 2/9  Decreased Interest 1 0 2 0 0  Down, Depressed, Hopeless 3 0 1 0 0  PHQ - 2 Score 4 0 3 0 0  Altered sleeping 3 0 3 3 0  Tired, decreased energy 1 0 3 1 1   Change in appetite 0 0 1 0 1  Feeling bad or failure about yourself  1 0 1 0 0  Trouble concentrating 0 0 0 0 0  Moving slowly or fidgety/restless 0 0 0 0 0  Suicidal thoughts 0 0 0 0 0  PHQ-9 Score 9 0 11 4 2   Difficult doing work/chores     Not difficult at all      07/31/2023    5:29 PM 06/12/2023    2:27 PM 02/10/2023    4:15 PM 11/13/2022   10:31 AM  GAD 7 : Generalized Anxiety Score  Nervous, Anxious, on Edge 2 0 1 0  Control/stop worrying 3 0 3 0  Worry too much - different things 3 0 1 0  Trouble relaxing 3 0 1 0  Restless 3 0 0 1  Easily annoyed or irritable 3 0 3 1  Afraid - awful might happen 1 0 1 0  Total GAD 7 Score 18 0 10 2

## 2023-08-06 ENCOUNTER — Telehealth: Payer: Self-pay | Admitting: Family Medicine

## 2023-08-06 NOTE — Telephone Encounter (Signed)
Patient called in stating that the medication she was prescribed is too strong, she is drowsy, unable to take care her newborn baby, blurry vision. She said she will rather that anxiety medication than this new one.

## 2023-08-11 LAB — TOXASSURE FLEX 15, UR
6-ACETYLMORPHINE IA: NEGATIVE ng/mL
7-aminoclonazepam: NOT DETECTED ng/mg{creat}
AMPHETAMINES IA: NEGATIVE ng/mL
Alpha-hydroxyalprazolam: >1015 ng/mg{creat}
Alpha-hydroxymidazolam: NOT DETECTED ng/mg{creat}
Alpha-hydroxytriazolam: NOT DETECTED ng/mg{creat}
Alprazolam: 302 ng/mg{creat}
BARBITURATES IA: NEGATIVE ng/mL
BUPRENORPHINE: POSITIVE
Benzodiazepines: POSITIVE
Buprenorphine: >508 ng/mg{creat}
Clonazepam: NOT DETECTED ng/mg{creat}
Creatinine: 197 mg/dL
Desalkylflurazepam: NOT DETECTED ng/mg{creat}
Desmethyldiazepam: NOT DETECTED ng/mg{creat}
Desmethylflunitrazepam: NOT DETECTED ng/mg{creat}
Diazepam: NOT DETECTED ng/mg{creat}
ETHYL ALCOHOL Enzymatic: NEGATIVE g/dL
FENTANYL: NEGATIVE
Fentanyl: NOT DETECTED ng/mg{creat}
Flunitrazepam: NOT DETECTED ng/mg{creat}
Lorazepam: NOT DETECTED ng/mg{creat}
METHADONE IA: NEGATIVE ng/mL
METHADONE MTB IA: NEGATIVE ng/mL
Midazolam: NOT DETECTED ng/mg{creat}
Norbuprenorphine: >508 ng/mg{creat}
Norfentanyl: NOT DETECTED ng/mg{creat}
Oxazepam: NOT DETECTED ng/mg{creat}
PHENCYCLIDINE IA: NEGATIVE ng/mL
TAPENTADOL, IA: NEGATIVE ng/mL
TRAMADOL IA: NEGATIVE ng/mL
Temazepam: NOT DETECTED ng/mg{creat}

## 2023-08-11 LAB — OPIATE CLASS, MS, UR RFX
Codeine: NOT DETECTED ng/mg{creat}
Dihydrocodeine: NOT DETECTED ng/mg{creat}
Hydrocodone: NOT DETECTED ng/mg{creat}
Hydromorphone: NOT DETECTED ng/mg{creat}
Morphine: NOT DETECTED ng/mg{creat}
Norcodeine: NOT DETECTED ng/mg{creat}
Norhydrocodone: NOT DETECTED ng/mg{creat}
Normorphine: NOT DETECTED ng/mg{creat}
Opiate Class Confirmation: NEGATIVE

## 2023-08-11 LAB — COCAINE AND MTB, MS, UR RFX
Benzoylecgonine: >2538 ng/mg{creat}
Cocaethylene: NOT DETECTED ng/mg{creat}
Cocaine Confirmation: POSITIVE
Cocaine: NOT DETECTED ng/mg{creat}

## 2023-08-11 LAB — CANNABINOIDS, MS, UR RFX
Cannabinoids Confirmation: POSITIVE
Carboxy-THC: 8 ng/mg{creat}

## 2023-08-11 LAB — OXYCODONE CLASS, MS, UR RFX
Noroxycodone: 3456 ng/mg{creat}
Noroxymorphone: 882 ng/mg{creat}
Oxycodone Class Confirmation: POSITIVE
Oxycodone: 3981 ng/mg{creat}
Oxymorphone: 4482 ng/mg{creat}

## 2023-08-14 ENCOUNTER — Telehealth: Payer: Self-pay | Admitting: Clinical

## 2023-08-14 ENCOUNTER — Encounter: Payer: Self-pay | Admitting: Family Medicine

## 2023-08-14 ENCOUNTER — Telehealth: Payer: Medicaid Other | Admitting: Family Medicine

## 2023-08-14 ENCOUNTER — Other Ambulatory Visit (HOSPITAL_COMMUNITY): Payer: Self-pay

## 2023-08-14 DIAGNOSIS — F32A Depression, unspecified: Secondary | ICD-10-CM | POA: Diagnosis not present

## 2023-08-14 DIAGNOSIS — F1190 Opioid use, unspecified, uncomplicated: Secondary | ICD-10-CM

## 2023-08-14 DIAGNOSIS — F419 Anxiety disorder, unspecified: Secondary | ICD-10-CM | POA: Diagnosis not present

## 2023-08-14 MED ORDER — BUPRENORPHINE HCL-NALOXONE HCL 8-2 MG SL FILM
1.0000 | ORAL_FILM | Freq: Three times a day (TID) | SUBLINGUAL | 0 refills | Status: DC
  Filled 2023-08-14 – 2023-08-18 (×3): qty 63, 21d supply, fill #0

## 2023-08-14 NOTE — Assessment & Plan Note (Signed)
No improvement in symptoms, though is still quite early, and excessive sedation with PRN meds though I wonder if some degree of psychomotor symptoms are at play. Recommended stop hydroxyzine, cut buspar dose to 5 mg. We again discussed in detail why risk/benefit ratio for benzodiazapenes given her history is not favorable and that I would not prescribe benzo's to her. Given uncontrolled symptoms discussed urgent referral to psych, she was amenable to this plan. Prefers virtual visits, phone number updated in computer.

## 2023-08-14 NOTE — Telephone Encounter (Signed)
Brief check-in, per Dr. Crissie Reese; pt is reminded of her upcoming virtual appointment on Monday, 08/18/23; Encouraged to take medication as prescribed only (Pt taking Xanax, given to her by her aunt; "only when I need it"); pt discouraged from taking Xanax, and to establish care with psychiatry for ongoing [facility] medication management. Pt agrees with referral to psychiatry, aware she will receive a call to schedule initial appointment; also aware she can use walk-in hours at [facility] to establish care with psychiatry to be seen earlier.

## 2023-08-14 NOTE — Progress Notes (Signed)
TELEHEALTH GYNECOLOGY VISIT ENCOUNTER NOTE  Provider location: Center for [facility]   Patient location: Home  I connected with [name] on 08/14/23 at  3:55 PM EDT by MyChart video and verified that I am speaking with the correct person using two identifiers.     I discussed the limitations, risks, security and privacy concerns of performing an evaluation and management service by telephone and the availability of in person appointments. I also discussed with the patient that there may be a patient responsible charge related to this service. The patient expressed understanding and agreed to proceed.   History:  Sandra Matthews is a 27 y.o. [phone] female being evaluated today for OUD follow up.   Last seen 07/31/23, at that time was struggling with anxiety and depression Had also relapsed with pain pills, cocaine, and benzo's UDS was consistent with this but did show buprenorphine and norbuprenorphine in high concentrations Asked about starting xanax but we reviewed this was not recommended. Started on fluoxetine, buspar/hydroxyzine PRN, and had warm hand off with [facility] in the office  Today reports she feels like her meds are too sedating. Only taking buspar and vistaril as needed but still feels that way. Wondering again if xanax is a possible option. Having a lot going on, grandma has kicked her out a few times. So drowsy her family has accused her of relapse. Has stopped taking hydroxyzine altogether and is cutting buspar in half and still feels sedated.      Past Medical History:  Diagnosis Date   Anemia    Anxiety and depression 02/04/2023   Depression 09/27/2022   PP stuff, was thinking of calling and talking with someone   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   + THC 01/31/2020   Preterm labor    Supervision of other high risk pregnancy, antepartum 11/13/2022          Nursing Staff  Provider  Office Location  [facility]  Dating   05/08/2023,  by Last Menstrual Period  [facility] Model  [X]  Traditional  [ ]  Centering  [ ]  Mom-Baby Dyad        Language   English  Anatomy US   Normal>IUGR  Flu Vaccine      Genetic/Carrier Screen   NIPS:   low risk female  AFP:   not done  Horizon: neg 14/14  TDaP Vaccine    Deferred on 02/04/23 (would like at next visit)   Hgb A1C or   GTT   Past Surgical History:  Procedure Laterality Date   FRACTURE SURGERY     broken arm in childhood, cast only, no surgery   The following portions of the patient's history were reviewed and updated as appropriate: allergies, current medications, past family history, past medical history, past social history, past surgical history and problem list.   Health Maintenance:      Component Value Date/Time   DIAGPAP  11/13/2022 1058    - Negative for Intraepithelial Lesions or Malignancy (NILM)   DIAGPAP - Benign reactive/reparative changes 11/13/2022 1058   DIAGPAP (A) 08/01/2021 1031    - Atypical squamous cells of undetermined significance (ASC-US)   HPVHIGH Positive (A) 08/01/2021 1031   ADEQPAP Satisfactory for evaluation.  The absence of an 11/13/2022 1058   ADEQPAP  11/13/2022 1058    endocervical/transformation zone component is not uncommon in pregnant   ADEQPAP patients. 11/13/2022 1058      Review of Systems:  Pertinent items noted in HPI  and remainder of comprehensive ROS otherwise negative.  Physical Exam:   General:  Alert, oriented and cooperative.   Mental Status: Depressed mood and affect  Physical exam deferred due to nature of the encounter  Labs and Imaging No results found for this or any previous visit (from the past 336 hour(s)). No results found.    Assessment and Plan:     Anxiety and depression Assessment & Plan: No improvement in symptoms, though is still quite early, and excessive sedation with PRN meds though I wonder if some degree of psychomotor symptoms are at play. Recommended stop hydroxyzine, cut buspar dose to 5 mg. We again  discussed in detail why risk/benefit ratio for benzodiazapenes given her history is not favorable and that I would not prescribe benzo's to her. Given uncontrolled symptoms discussed urgent referral to psych, she was amenable to this plan. Prefers virtual visits, phone number updated in computer.   Orders: -     Ambulatory referral to Psychiatry  Opioid use disorder Assessment & Plan: Reports she is stable, taking all meds as prescribed. Refill sent, see back in about 3 weeks in person for UDS.  Orders: -     Buprenorphine HCl-Naloxone HCl; Place 1 Film under the tongue 3 (three) times daily for 21 days.  Dispense: 63 Film; Refill: 0         I discussed the assessment and treatment plan with the patient. The patient was provided an opportunity to ask questions and all were answered. The patient agreed with the plan and demonstrated an understanding of the instructions.   The patient was advised to call back or seek an in-person evaluation/go to the ED if the symptoms worsen or if the condition fails to improve as anticipated.  I provided 20 minutes of non-face-to-face time during this encounter.   Venora Maples, MD/MPH Attending Family Medicine Physician, [facility], [facility]

## 2023-08-14 NOTE — Assessment & Plan Note (Signed)
Reports she is stable, taking all meds as prescribed. Refill sent, see back in about 3 weeks in person for UDS.

## 2023-08-14 NOTE — Progress Notes (Signed)
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  Mom reports infant is ~15 pounds with next pediatrician visit in early November.  Mom requested assistance with setting up [facility] appointment at her next in person visit. Infant present during visit.  Mom praised for her safe sleep with infant on his back in own sleep space with pacifier.  Mom has new phone number and requested text from Substance Exposed Newborn consult team (message sent) and is aware she can contact with ongoing needs.

## 2023-08-15 ENCOUNTER — Telehealth: Payer: Self-pay | Admitting: Family Medicine

## 2023-08-15 ENCOUNTER — Encounter: Payer: Self-pay | Admitting: Family Medicine

## 2023-08-15 ENCOUNTER — Telehealth: Payer: Medicaid Other | Admitting: Family Medicine

## 2023-08-15 DIAGNOSIS — F32A Depression, unspecified: Secondary | ICD-10-CM

## 2023-08-15 DIAGNOSIS — F1190 Opioid use, unspecified, uncomplicated: Secondary | ICD-10-CM

## 2023-08-15 DIAGNOSIS — F419 Anxiety disorder, unspecified: Secondary | ICD-10-CM

## 2023-08-15 NOTE — Telephone Encounter (Signed)
Patient calle din requesting to speak with Dr. Crissie Reese regarding the medication she is on. Patient states she is unable to take care of her baby and her family is accusing her of using other drugs. Patient also expressed that all these family issues is making her more depressed. She just need her family to understand Dr. Crissie Reese plan of care so she wont be accused of abusing other drugs.

## 2023-08-15 NOTE — Patient Instructions (Addendum)
Sandra Matthews, thank you for joining [name], NP for today's virtual visit.  While this provider is not your primary care provider (PCP), if your PCP is located in our provider database this encounter information will be shared with them immediately following your visit.   A [facility] MyChart account gives you access to today's visit and all your visits, tests, and labs performed at [facility] " click here if you don't have a [facility] MyChart account or go to [email or web address]  Consent: (Patient) Sandra Matthews provided verbal consent for this virtual visit at the beginning of the encounter.  Current Medications:  Current Outpatient Medications:    Buprenorphine HCl-Naloxone HCl (SUBOXONE) 8-2 MG FILM, Place 1 Film under the tongue 3 (three) times daily for 21 days., Disp: 63 Film, Rfl: 0   busPIRone (BUSPAR) 10 MG tablet, Take 1 tablet (10 mg total) by mouth 3 (three) times daily as needed (anxiety)., Disp: 90 tablet, Rfl: 2   FLUoxetine (PROZAC) 10 MG capsule, Take 1 capsule (10 mg total) by mouth daily., Disp: 90 capsule, Rfl: 1   naloxone (NARCAN) nasal spray 4 mg/0.1 mL, Place 1 spray into the nose once as needed for up to 1 dose (overdose). (Patient not taking: Reported on 03/06/2023), Disp: 1 each, Rfl: 0   nicotine (NICODERM CQ - DOSED IN MG/24 HOURS) 21 mg/24hr patch, Place 1 patch (21 mg total) onto the skin daily for 4 weeks. (Remove old patch)., Disp: 28 patch, Rfl: 0   nicotine (NICODERM CQ - DOSED IN MG/24 HR) 7 mg/24hr patch, Start after completing 14 mg patches. Place 1 patch (7 mg total) onto the skin daily for 2 weeks. (Remove old patch)., Disp: 14 patch, Rfl: 0   nicotine (NICOTINE STEP 2) 14 mg/24hr patch, Start after completing 21 mg patches. Place 1 patch (14 mg total) onto the skin daily for 2 weeks. (Remove old patch)., Disp: 14 patch, Rfl: 0   nicotine polacrilex (NICORETTE) 2 MG gum, Take 1 each (2 mg total) by mouth as  needed for smoking cessation., Disp: 100 tablet, Rfl: 0   Medications ordered in this encounter:  No orders of the defined types were placed in this encounter.    *If you need refills on other medications prior to your next appointment, please contact your pharmacy*  Follow-Up: Call back or seek an in-person evaluation if the symptoms worsen or if the condition fails to improve as anticipated.  [facility] [phone]  Other Instructions  [facility]:  Please get some help today, so you can help yourself and your children  Phone:  [phone]  Address:  [address]  [place], [place] [address]  Hours:  Open 24/7, No appointment required.   If you have been instructed to have an in-person evaluation today at a local Urgent Care facility, please use the link below. It will take you to a list of all of our available [facility], including address, phone number and hours of operation. Please do not delay care.  [facility]  If you or a family member do not have a primary care provider, use the link below to schedule a visit and establish care. When you choose a [facility] primary care physician or advanced practice provider, you gain a long-term partner in health. Find a Primary Care Provider  Learn more about [facility]'s in-office and virtual care options:  - Get Care Now

## 2023-08-15 NOTE — Progress Notes (Signed)
Virtual Visit Consent   Sandra Matthews, you are scheduled for a virtual visit with a [facility] provider today. Just as with appointments in the office, your consent must be obtained to participate. Your consent will be active for this visit and any virtual visit you may have with one of our providers in the next 365 days. If you have a MyChart account, a copy of this consent can be sent to you electronically.  As this is a virtual visit, video technology does not allow for your provider to perform a traditional examination. This may limit your provider's ability to fully assess your condition. If your provider identifies any concerns that need to be evaluated in person or the need to arrange testing (such as labs, EKG, etc.), we will make arrangements to do so. Although advances in technology are sophisticated, we cannot ensure that it will always work on either your end or our end. If the connection with a video visit is poor, the visit may have to be switched to a telephone visit. With either a video or telephone visit, we are not always able to ensure that we have a secure connection.  By engaging in this virtual visit, you consent to the provision of healthcare and authorize for your insurance to be billed (if applicable) for the services provided during this visit. Depending on your insurance coverage, you may receive a charge related to this service.  I need to obtain your verbal consent now. Are you willing to proceed with your visit today? [name] has provided verbal consent on 08/15/2023 for a virtual visit (video or telephone). [name], NP  Date: 08/15/2023 12:57 PM  Virtual Visit via Video Note   I, [name], connected with  Sandra Matthews  ([id number], [birth year]) on 08/15/23 at 12:45 PM EDT by a video-enabled telemedicine application and verified that I am speaking with the correct person using two identifiers.  Location: Patient: Virtual Visit  Location Patient: Home Provider: Virtual Visit Location Provider: Home Office   I discussed the limitations of evaluation and management by telemedicine and the availability of in person appointments. The patient expressed understanding and agreed to proceed.    History of Present Illness: Sandra Matthews is a 27 y.o. who identifies as a female who was assigned female at birth, and is being seen today for medication questions.   She was seen by video yesterday for OUD follow up, and anxiety and depression. She reported her meds are too sedating. Using buspar and vistaril as needed, but still reports feeling that way. She asked again about xanax as an option, she has asked this in the past as well. Reported having a lot going on with grandma kicked her out a few times. Reports being so drowsy her family is accusing her of relapse. She stopped vistaril and reduced buspar and still feeling sedated.  Today she is reporting same as yesterday. She said she was asked to be honest about things. She reports she took a xanax of her aunt's and that it helped her not be so sedated and she could function and take care of her kids better with it.   She is asking again about getting xanax and reports wanting to go to a dr if that is needed. She has a urgent referral to psych in place for this Monday an appt has been made.    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   Anxiety and depression 02/04/2023   Opioid use disorder 11/05/2022    Allergies: No Known Allergies Medications:  Current Outpatient Medications:    Buprenorphine HCl-Naloxone HCl (SUBOXONE) 8-2 MG FILM, Place 1 Film under the tongue 3 (three) times daily for 21 days., Disp: 63 Film, Rfl: 0   busPIRone (BUSPAR) 10 MG tablet, Take 1 tablet (10 mg total) by mouth 3 (three) times daily as needed (anxiety)., Disp: 90 tablet, Rfl: 2   FLUoxetine  (PROZAC) 10 MG capsule, Take 1 capsule (10 mg total) by mouth daily., Disp: 90 capsule, Rfl: 1   naloxone (NARCAN) nasal spray 4 mg/0.1 mL, Place 1 spray into the nose once as needed for up to 1 dose (overdose). (Patient not taking: Reported on 03/06/2023), Disp: 1 each, Rfl: 0   nicotine (NICODERM CQ - DOSED IN MG/24 HOURS) 21 mg/24hr patch, Place 1 patch (21 mg total) onto the skin daily for 4 weeks. (Remove old patch)., Disp: 28 patch, Rfl: 0   nicotine (NICODERM CQ - DOSED IN MG/24 HR) 7 mg/24hr patch, Start after completing 14 mg patches. Place 1 patch (7 mg total) onto the skin daily for 2 weeks. (Remove old patch)., Disp: 14 patch, Rfl: 0   nicotine (NICOTINE STEP 2) 14 mg/24hr patch, Start after completing 21 mg patches. Place 1 patch (14 mg total) onto the skin daily for 2 weeks. (Remove old patch)., Disp: 14 patch, Rfl: 0   nicotine polacrilex (NICORETTE) 2 MG gum, Take 1 each (2 mg total) by mouth as needed for smoking cessation., Disp: 100 tablet, Rfl: 0  Observations/Objective: Patient is well-developed, well-nourished  Head is normocephalic, atraumatic.  No labored breathing.  Speech is slowed, and delayed.  She is repeating herself often. Patient is alert.   Assessment and Plan:  1. Opioid use disorder  Long discussion on her appt yesterday and how her family is acting. She is repeating herself, having delayed speech, and appears to be in an altered state. She reports she is tired. She reports the reduction in buspar did not help. But also reported taking xanax that was not ordered to her. She reports taking her other medication as ordered.  Advised [facility] UC and she started to back off the need to be seen before Monday. But followed up that she might go after her mom is off work. But asked several times about if they would change her medications or order her something else.   I advised if she was unable to care for her child, that she can call 911 to come help get her to the  hospital so they can assist her in her care and get them all taken care of. She reports her step dad is home, but just stepped out of a minute.  Prior to ending conversation, provider strongly encouraged her to be seen today.  Patient acknowledged agreement and understanding of the plan, however was changing her mind at end of appt.  BH UC on AVS   Follow Up Instructions: I discussed the assessment and treatment plan with the patient. The patient was provided an opportunity to ask questions and all were answered. The patient agreed with the plan and demonstrated an understanding of the instructions.  A copy of instructions were sent to the patient via MyChart unless otherwise noted below.     The patient was advised to call back or seek an in-person evaluation if the symptoms worsen or if the condition  fails to improve as anticipated.    [name], NP

## 2023-08-18 ENCOUNTER — Ambulatory Visit (INDEPENDENT_AMBULATORY_CARE_PROVIDER_SITE_OTHER): Payer: Self-pay | Admitting: Clinical

## 2023-08-18 ENCOUNTER — Other Ambulatory Visit (HOSPITAL_COMMUNITY): Payer: Self-pay

## 2023-08-18 DIAGNOSIS — F331 Major depressive disorder, recurrent, moderate: Secondary | ICD-10-CM

## 2023-08-18 DIAGNOSIS — Z658 Other specified problems related to psychosocial circumstances: Secondary | ICD-10-CM

## 2023-08-18 DIAGNOSIS — F411 Generalized anxiety disorder: Secondary | ICD-10-CM

## 2023-08-18 MED ORDER — CITALOPRAM HYDROBROMIDE 10 MG PO TABS
10.0000 mg | ORAL_TABLET | Freq: Every day | ORAL | 3 refills | Status: DC
  Filled 2023-08-18: qty 90, 90d supply, fill #0

## 2023-08-18 MED ORDER — HYDROXYZINE HCL 10 MG PO TABS
10.0000 mg | ORAL_TABLET | Freq: Three times a day (TID) | ORAL | 1 refills | Status: DC | PRN
  Filled 2023-08-18: qty 30, 10d supply, fill #0
  Filled 2023-09-16: qty 30, 10d supply, fill #1

## 2023-08-18 NOTE — Addendum Note (Signed)
Addended by: Merian Capron on: 08/18/2023 03:59 PM   Modules accepted: Orders

## 2023-08-18 NOTE — Telephone Encounter (Signed)
Patient called back. Discussed wanting to try less sedating meds. Also reports that her mother took all of her buspar, hydroxyzine, and fluoxetine and is refusing to give it back and thinks she should not be on anything. We discussed alternative options such as Celexa which can be more activating and she is willing to try. Also wants to try lower dose of hydroxyzine, rx sent.

## 2023-08-18 NOTE — Telephone Encounter (Signed)
Called patient today (08/18/2023) to see how she is doing. Reviewed chart and saw that she had urgent care virtual visit where she again went over many of the same topics we discussed at her visit. Reports she is doing OK today. She then said her phone was about to die, which it did. Attempted to call her back but unable to reach her, will follow up by MyChart.

## 2023-08-18 NOTE — Patient Instructions (Addendum)
Center for [facility] at [facility] for Women [address] [place], [place] [address] [phone] (main office) [phone] [facility] office)  New Parent Support Groups [email or web address]  [employer] [email or web address]  October 29 @ 9:30 am - 11:00 am Financial Fitness: Physicist, medical by: Ezzie Dural, Earley Favor Having a budget is better!  We will discuss credit, spending and other financial tips and tricks to help you stay on budget, save money, and be financially savvy. Registration is required! Call [phone] or email [email or web address] with your name, phone number and title of workshop to secure your seat!   [facility]  [address], [place], [place] [address] [phone] or [phone] [facility] 24/7 FOR ANYONE [address], [place], [place]  [phone] Fax: [phone] [email or web address] *Interpreters available *Accepts all insurance and uninsured for Urgent Care needs *Accepts Medicaid and uninsured for outpatient treatment (below)    ONLY FOR [facility]  Below:   Outpatient New Patient Assessment/Therapy Walk-ins:        Monday -Thursday 8am until slots are full.        Every Friday 1pm-4pm  (first come, first served)                   New Patient Psychiatry/Medication Management        Monday-Friday 8am-11am (first come, first served)              For all walk-ins we ask that you arrive by 7:15am, because patients will be seen in the order of arrival.

## 2023-08-22 ENCOUNTER — Other Ambulatory Visit (HOSPITAL_COMMUNITY): Payer: Self-pay

## 2023-08-22 DIAGNOSIS — Z419 Encounter for procedure for purposes other than remedying health state, unspecified: Secondary | ICD-10-CM | POA: Diagnosis not present

## 2023-08-27 ENCOUNTER — Other Ambulatory Visit (HOSPITAL_COMMUNITY): Payer: Self-pay

## 2023-08-27 NOTE — BH Specialist Note (Deleted)
Pt did not arrive to video visit and did not answer the phone; Unable to leave voice message as "call cannot be completed at this time" after several attempts; left MyChart message for patient.

## 2023-08-28 ENCOUNTER — Ambulatory Visit (INDEPENDENT_AMBULATORY_CARE_PROVIDER_SITE_OTHER): Payer: Medicaid Other | Admitting: Family Medicine

## 2023-08-28 ENCOUNTER — Other Ambulatory Visit (HOSPITAL_COMMUNITY): Payer: Self-pay

## 2023-08-28 VITALS — BP 122/86 | HR 73 | Wt 144.8 lb

## 2023-08-28 DIAGNOSIS — F1190 Opioid use, unspecified, uncomplicated: Secondary | ICD-10-CM

## 2023-08-28 DIAGNOSIS — F32A Depression, unspecified: Secondary | ICD-10-CM

## 2023-08-28 DIAGNOSIS — Z7689 Persons encountering health services in other specified circumstances: Secondary | ICD-10-CM | POA: Diagnosis not present

## 2023-08-28 DIAGNOSIS — F419 Anxiety disorder, unspecified: Secondary | ICD-10-CM | POA: Diagnosis not present

## 2023-08-28 MED ORDER — BUPRENORPHINE HCL-NALOXONE HCL 8-2 MG SL FILM
1.0000 | ORAL_FILM | Freq: Three times a day (TID) | SUBLINGUAL | 1 refills | Status: DC
  Filled 2023-08-28 – 2023-09-08 (×4): qty 90, 30d supply, fill #0
  Filled 2023-09-29 – 2023-10-06 (×3): qty 90, 30d supply, fill #1

## 2023-08-28 NOTE — Assessment & Plan Note (Signed)
Reports stable on suboxone, OK with TID dosing. Discussed transition to sublocade, she is open to trying it. After reviewing REACH clinic dates she will return on 10/16/23 to initiate.  Encourage harm reduction, have discussed at length that mixing suboxone and benzodiazapenes is not ideal.

## 2023-08-28 NOTE — Assessment & Plan Note (Signed)
Continue celexa. Asked her to send me a message in 4 weeks if no improvement and we can titrate the dose up.

## 2023-08-28 NOTE — Progress Notes (Signed)
Sandra Matthews is a 27 y.o. [phone] here today for OUD follow up.  Last seen in person on 07/31/23, UDS at tha time with multiple substances but also did have buprenorphine Over the past month since then have been struggling to help her with mental health and have tried various meds We last spoke on 08/18/23, at that time had switched to Celexa and lower dose of hydroxyzine PRN Per PDMP last fill of suboxone was 08/18/2023 for 8 mg TID  Today reports just picked up celexa yesterday and started taking it Reports she is taking suboxone as prescribed Still taking xanax occasionally from a family member Denies any oxycodone or other opioid use  Health Maintenance Due  Topic Date Due   INFLUENZA VACCINE  05/22/2023   COVID-19 Vaccine (1 - 2023-24 season) Never done    Past Medical History:  Diagnosis Date   Anemia    Anxiety and depression 02/04/2023   Depression 09/27/2022   PP stuff, was thinking of calling and talking with someone   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   + THC 01/31/2020   Preterm labor    Supervision of other high risk pregnancy, antepartum 11/13/2022          Nursing Staff  Provider  Office Location  [facility]  Dating   05/08/2023, by Last Menstrual Period  [facility] Model  [X]  Traditional  [ ]  Centering  [ ]  Mom-Baby Dyad        Language   English  Anatomy US   Normal>IUGR  Flu Vaccine      Genetic/Carrier Screen   NIPS:   low risk female  AFP:   not done  Horizon: neg 14/14  TDaP Vaccine    Deferred on 02/04/23 (would like at next visit)   Hgb A1C or   GTT    Past Surgical History:  Procedure Laterality Date   FRACTURE SURGERY     broken arm in childhood, cast only, no surgery    The following portions of the patient's history were reviewed and updated as appropriate: allergies, current medications, past family history, past medical history, past social history, past surgical history and problem list.   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  Physical Exam:  BP 122/86   Pulse 73   Wt 144 lb 12.8 oz (65.7 kg)   LMP  (LMP Unknown)   BMI 26.48 kg/m  CONSTITUTIONAL: Well-developed, well-nourished female in no acute distress.  HEENT:  Normocephalic, atraumatic. External right and left ear normal. No scleral icterus.  NECK: Normal range of motion, supple, no masses noted on observation SKIN: No rash noted. Not diaphoretic. No erythema. No pallor. MUSCULOSKELETAL: Normal range of motion. No edema noted. NEUROLOGIC: Alert and oriented to person, place, and time. Normal muscle tone coordination.  PSYCHIATRIC: Normal mood and affect. Normal behavior. Normal judgment and thought content. RESPIRATORY: Effort normal, no problems with respiration noted  Labs and Imaging No results found for this or any previous visit (from the past 168 hour(s)). No results found.    Assessment and Plan:   Problem List Items Addressed This Visit  Other   Anxiety and depression    Continue celexa. Asked her to send me a message in 4 weeks if no improvement and we can titrate the dose up.       Opioid use disorder - Primary    Reports stable on suboxone, OK with TID dosing. Discussed transition to sublocade, she is open to trying it. After reviewing REACH clinic dates she will return on 10/16/23 to initiate.  Encourage harm reduction, have discussed at length that mixing suboxone and benzodiazapenes is not ideal.      Relevant Medications   Buprenorphine HCl-Naloxone HCl (SUBOXONE) 8-2 MG FILM   Other Relevant Orders   ToxAssure Flex 15, Ur    Routine preventative health maintenance measures emphasized. Please refer to  After Visit Summary for other counseling recommendations.   Return in about 6 weeks (around 10/09/2023) for REACH clinic.    Total face-to-face time with patient: 20 minutes.  Over 50% of encounter was spent on counseling and coordination of care.   Venora Maples, MD/MPH Attending Family Medicine Physician, [facility] for [facility], [facility]

## 2023-09-02 ENCOUNTER — Other Ambulatory Visit (HOSPITAL_COMMUNITY): Payer: Self-pay

## 2023-09-02 LAB — TOXASSURE FLEX 15, UR
6-ACETYLMORPHINE IA: NEGATIVE ng/mL
7-aminoclonazepam: NOT DETECTED ng/mg{creat}
AMPHETAMINES IA: NEGATIVE ng/mL
Alpha-hydroxyalprazolam: 146 ng/mg{creat}
Alpha-hydroxymidazolam: NOT DETECTED ng/mg{creat}
Alpha-hydroxytriazolam: NOT DETECTED ng/mg{creat}
Alprazolam: 67 ng/mg{creat}
BARBITURATES IA: NEGATIVE ng/mL
BUPRENORPHINE: POSITIVE
Benzodiazepines: POSITIVE
Buprenorphine: 249 ng/mg{creat}
CANNABINOIDS IA: NEGATIVE ng/mL
COCAINE METABOLITE IA: NEGATIVE ng/mL
Clonazepam: NOT DETECTED ng/mg{creat}
Creatinine: 233 mg/dL
Desalkylflurazepam: NOT DETECTED ng/mg{creat}
Desmethyldiazepam: NOT DETECTED ng/mg{creat}
Desmethylflunitrazepam: NOT DETECTED ng/mg{creat}
Diazepam: NOT DETECTED ng/mg{creat}
ETHYL ALCOHOL Enzymatic: NEGATIVE g/dL
FENTANYL: NEGATIVE
Fentanyl: NOT DETECTED ng/mg{creat}
Flunitrazepam: NOT DETECTED ng/mg{creat}
Lorazepam: NOT DETECTED ng/mg{creat}
METHADONE IA: NEGATIVE ng/mL
METHADONE MTB IA: NEGATIVE ng/mL
Midazolam: NOT DETECTED ng/mg{creat}
Norbuprenorphine: >429 ng/mg{creat}
Norfentanyl: NOT DETECTED ng/mg{creat}
OPIATE CLASS IA: NEGATIVE ng/mL
OXYCODONE CLASS IA: NEGATIVE ng/mL
Oxazepam: NOT DETECTED ng/mg{creat}
PHENCYCLIDINE IA: NEGATIVE ng/mL
TAPENTADOL, IA: NEGATIVE ng/mL
TRAMADOL IA: NEGATIVE ng/mL
Temazepam: NOT DETECTED ng/mg{creat}

## 2023-09-03 ENCOUNTER — Other Ambulatory Visit (HOSPITAL_COMMUNITY): Payer: Self-pay

## 2023-09-08 ENCOUNTER — Other Ambulatory Visit: Payer: Self-pay

## 2023-09-08 ENCOUNTER — Other Ambulatory Visit (HOSPITAL_COMMUNITY): Payer: Self-pay

## 2023-09-16 ENCOUNTER — Other Ambulatory Visit (HOSPITAL_COMMUNITY): Payer: Self-pay

## 2023-09-21 DIAGNOSIS — Z419 Encounter for procedure for purposes other than remedying health state, unspecified: Secondary | ICD-10-CM | POA: Diagnosis not present

## 2023-09-29 ENCOUNTER — Other Ambulatory Visit (HOSPITAL_COMMUNITY): Payer: Self-pay

## 2023-10-02 ENCOUNTER — Ambulatory Visit (HOSPITAL_COMMUNITY): Payer: Medicaid Other | Admitting: Clinical

## 2023-10-02 ENCOUNTER — Encounter (HOSPITAL_COMMUNITY): Payer: Self-pay

## 2023-10-06 ENCOUNTER — Other Ambulatory Visit: Payer: Self-pay

## 2023-10-06 ENCOUNTER — Other Ambulatory Visit (HOSPITAL_COMMUNITY): Payer: Self-pay

## 2023-10-06 DIAGNOSIS — Z7689 Persons encountering health services in other specified circumstances: Secondary | ICD-10-CM | POA: Diagnosis not present

## 2023-10-08 ENCOUNTER — Telehealth: Payer: Self-pay | Admitting: Family Medicine

## 2023-10-08 NOTE — Telephone Encounter (Signed)
Patient is requesting that Dr.Eckstat refills her medication and also asked that he upped the dosage. She also will not be able to make her appointment on 12/19 due to no transportation. She is already scheduled for the following week. I made sure she was aware of her next appointment so that she can set up transportation through medicaid.

## 2023-10-09 ENCOUNTER — Ambulatory Visit: Payer: Medicaid Other | Admitting: Family Medicine

## 2023-10-09 ENCOUNTER — Institutional Professional Consult (permissible substitution): Payer: Medicaid Other

## 2023-10-16 ENCOUNTER — Institutional Professional Consult (permissible substitution): Payer: Medicaid Other

## 2023-10-16 ENCOUNTER — Ambulatory Visit: Payer: Medicaid Other | Admitting: Family Medicine

## 2023-10-22 DIAGNOSIS — Z419 Encounter for procedure for purposes other than remedying health state, unspecified: Secondary | ICD-10-CM | POA: Diagnosis not present

## 2023-10-23 ENCOUNTER — Encounter (HOSPITAL_COMMUNITY): Payer: Self-pay

## 2023-10-24 NOTE — Progress Notes (Signed)
 Patient did not connect for virtual psychiatric medication management appointment on 10/27/23 at 1PM. Sent secure video link x2 with no response. Left VM with callback number to reschedule if interested in psychiatric management.  LAURAINE DELENA PUMMEL, MD 10/27/23

## 2023-10-27 ENCOUNTER — Encounter (HOSPITAL_COMMUNITY): Payer: Medicaid Other | Admitting: Psychiatry

## 2023-10-30 ENCOUNTER — Ambulatory Visit (HOSPITAL_COMMUNITY): Payer: Medicaid Other | Admitting: Clinical

## 2023-11-06 ENCOUNTER — Other Ambulatory Visit (HOSPITAL_COMMUNITY): Payer: Self-pay

## 2023-11-06 ENCOUNTER — Encounter: Payer: Self-pay | Admitting: Family Medicine

## 2023-11-06 ENCOUNTER — Ambulatory Visit: Payer: Medicaid Other | Admitting: Family Medicine

## 2023-11-06 VITALS — BP 133/86 | HR 108 | Wt 132.8 lb

## 2023-11-06 DIAGNOSIS — F419 Anxiety disorder, unspecified: Secondary | ICD-10-CM

## 2023-11-06 DIAGNOSIS — Z7689 Persons encountering health services in other specified circumstances: Secondary | ICD-10-CM | POA: Diagnosis not present

## 2023-11-06 DIAGNOSIS — F1190 Opioid use, unspecified, uncomplicated: Secondary | ICD-10-CM | POA: Diagnosis not present

## 2023-11-06 DIAGNOSIS — F32A Depression, unspecified: Secondary | ICD-10-CM

## 2023-11-06 MED ORDER — BUSPIRONE HCL 15 MG PO TABS
15.0000 mg | ORAL_TABLET | Freq: Three times a day (TID) | ORAL | 2 refills | Status: DC | PRN
  Filled 2023-11-06: qty 20, 6d supply, fill #0
  Filled 2023-11-06: qty 70, 24d supply, fill #0
  Filled 2023-12-03: qty 90, 30d supply, fill #0
  Filled 2024-01-27: qty 90, 30d supply, fill #1
  Filled 2024-03-29: qty 90, 30d supply, fill #2

## 2023-11-06 MED ORDER — CITALOPRAM HYDROBROMIDE 20 MG PO TABS
20.0000 mg | ORAL_TABLET | Freq: Every day | ORAL | 3 refills | Status: DC
  Filled 2023-11-06: qty 90, 90d supply, fill #0
  Filled 2023-12-03 – 2024-01-27 (×3): qty 90, 90d supply, fill #1
  Filled 2024-03-29 – 2024-04-05 (×2): qty 90, 90d supply, fill #2

## 2023-11-06 MED ORDER — HYDROXYZINE HCL 25 MG PO TABS
25.0000 mg | ORAL_TABLET | Freq: Three times a day (TID) | ORAL | 2 refills | Status: DC | PRN
  Filled 2023-11-06: qty 90, 30d supply, fill #0
  Filled 2023-12-03: qty 90, 30d supply, fill #1
  Filled 2024-01-27: qty 90, 30d supply, fill #2

## 2023-11-06 MED ORDER — BUPRENORPHINE HCL-NALOXONE HCL 8-2 MG SL FILM
1.0000 | ORAL_FILM | Freq: Three times a day (TID) | SUBLINGUAL | 0 refills | Status: DC
  Filled 2023-11-06: qty 90, 30d supply, fill #0

## 2023-11-06 MED ORDER — NALOXONE HCL 4 MG/0.1ML NA LIQD
1.0000 | Freq: Once | NASAL | 11 refills | Status: DC | PRN
  Filled 2023-11-06: qty 2, 2d supply, fill #0

## 2023-11-06 NOTE — Assessment & Plan Note (Addendum)
Stable. UDS today. Refill sent for suboxone. Acknowledged stimulant use and reminded patient that recovery is not a straight line and to not be overly critical of herself. Open to starting sublocade at next visit, will send rx closer to that visit. Also does not have narcan at home, refill sent.

## 2023-11-06 NOTE — Progress Notes (Signed)
Met with Sandra Matthews today at [facility] for ongoing substance exposed newborn consult. Infant was with Sandra Matthews and doing well, gaining weight, and sleeping through the night now at ~82 months of age. Encouraged to call/text [facility] consult phone number with needs in between her appointments.   Dennison Bulla, NNP-BC

## 2023-11-06 NOTE — Progress Notes (Signed)
Sandra Matthews is a 28 y.o. [phone] here today for OUD follow up.  Last seen 08/28/2023, at that time stable on suboxone 8 TID UDS at that time had normal bup concentrations, still using family member xanax and advised that this was not a good mix  Today reports she has had some relapse using cocaine once but denies any other relapses Has been taking suboxone as prescribed Still struggling with her anxiety and requesting increases in doses of buspar and hydroxyzine She is open to starting sublocade but would prefer to do so at next visit   Health Maintenance Due  Topic Date Due   Pneumococcal Vaccine 16-58 Years old (1 of 2 - PCV) Never done   INFLUENZA VACCINE  05/22/2023   COVID-19 Vaccine (1 - 2024-25 season) Never done    Past Medical History:  Diagnosis Date   Anemia    Anxiety and depression 02/04/2023   Depression 09/27/2022   PP stuff, was thinking of calling and talking with someone   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   + THC 01/31/2020   Preterm labor    Supervision of other high risk pregnancy, antepartum 11/13/2022          Nursing Staff  Provider  Office Location  [facility]  Dating   05/08/2023, by Last Menstrual Period  [facility] Model  [X]  Traditional  [ ]  Centering  [ ]  Mom-Baby Dyad        Language   English  Anatomy US   Normal>IUGR  Flu Vaccine      Genetic/Carrier Screen   NIPS:   low risk female  AFP:   not done  Horizon: neg 14/14  TDaP Vaccine    Deferred on 02/04/23 (would like at next visit)   Hgb A1C or   GTT    Past Surgical History:  Procedure Laterality Date   FRACTURE SURGERY     broken arm in childhood, cast only, no surgery    The following portions of the patient's history were reviewed and updated as appropriate: allergies, current medications, past family history, past medical history, past social history, past surgical history and problem list.   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  Physical Exam:  BP 133/86   Pulse (!) 108   Wt 132 lb 12.8 oz (60.2 kg)   Breastfeeding No   BMI 24.29 kg/m  CONSTITUTIONAL: Well-developed, well-nourished female in no acute distress.  HEENT:  Normocephalic, atraumatic. External right and left ear normal. No scleral icterus.  NECK: Normal range of motion, supple, no masses noted on observation SKIN: No rash noted. Not diaphoretic. No erythema. No pallor. MUSCULOSKELETAL: Normal range of motion. No edema noted. NEUROLOGIC: Alert and oriented to person, place, and time. Normal muscle tone coordination.  PSYCHIATRIC: Normal mood and affect. Normal behavior. Normal judgment and thought content. RESPIRATORY: Effort normal, no problems with respiration noted  Labs and Imaging No results found for this or any previous visit (from the past week). No results found.    Assessment and Plan:   Problem List Items Addressed This Visit       Other   Anxiety  and depression   Continue celexa, buspar, hydroxyzine. Doses for all three increased.       Relevant Medications   hydrOXYzine (ATARAX) 25 MG tablet   busPIRone (BUSPAR) 15 MG tablet   citalopram (CELEXA) 20 MG tablet   Opioid use disorder - Primary   Stable. UDS today. Refill sent for suboxone. Acknowledged stimulant use and reminded patient that recovery is not a straight line and to not be overly critical of herself. Open to starting sublocade at next visit, will send rx closer to that visit. Also does not have narcan at home, refill sent.       Relevant Medications   naloxone (NARCAN) nasal spray 4 mg/0.1 mL   Buprenorphine HCl-Naloxone HCl (SUBOXONE) 8-2 MG FILM   Other Relevant Orders   ToxAssure Flex  15, Ur    Routine preventative health maintenance measures emphasized. Please refer to After Visit Summary for other counseling recommendations.   Return in about 4 weeks (around 12/04/2023) for REACH clinic, starting sublocade.    Total face-to-face time with patient: 15 minutes.  Over 50% of encounter was spent on counseling and coordination of care.   Venora Maples, MD/MPH Attending Family Medicine Physician, [facility] for [facility], [facility]

## 2023-11-06 NOTE — Assessment & Plan Note (Signed)
Continue celexa, buspar, hydroxyzine. Doses for all three increased.

## 2023-11-07 ENCOUNTER — Other Ambulatory Visit: Payer: Self-pay

## 2023-11-12 LAB — TOXASSURE FLEX 15, UR
6-ACETYLMORPHINE IA: NEGATIVE ng/mL
7-aminoclonazepam: NOT DETECTED ng/mg{creat}
Alpha-hydroxyalprazolam: NOT DETECTED ng/mg{creat}
Alpha-hydroxymidazolam: NOT DETECTED ng/mg{creat}
Alpha-hydroxytriazolam: NOT DETECTED ng/mg{creat}
Alprazolam: NOT DETECTED ng/mg{creat}
BARBITURATES IA: NEGATIVE ng/mL
BUPRENORPHINE: POSITIVE
Benzodiazepines: NEGATIVE
Buprenorphine: 82 ng/mg{creat}
Clonazepam: NOT DETECTED ng/mg{creat}
Creatinine: 751 mg/dL
Desalkylflurazepam: NOT DETECTED ng/mg{creat}
Desmethyldiazepam: NOT DETECTED ng/mg{creat}
Desmethylflunitrazepam: NOT DETECTED ng/mg{creat}
Diazepam: NOT DETECTED ng/mg{creat}
ETHYL ALCOHOL Enzymatic: NEGATIVE g/dL
FENTANYL: NEGATIVE
Fentanyl: NOT DETECTED ng/mg{creat}
Flunitrazepam: NOT DETECTED ng/mg{creat}
Lorazepam: NOT DETECTED ng/mg{creat}
METHADONE IA: NEGATIVE ng/mL
METHADONE MTB IA: NEGATIVE ng/mL
Midazolam: NOT DETECTED ng/mg{creat}
Norbuprenorphine: >133 ng/mg{creat}
Norfentanyl: NOT DETECTED ng/mg{creat}
OPIATE CLASS IA: NEGATIVE ng/mL
OXYCODONE CLASS IA: NEGATIVE ng/mL
Oxazepam: NOT DETECTED ng/mg{creat}
PHENCYCLIDINE IA: NEGATIVE ng/mL
TAPENTADOL, IA: NEGATIVE ng/mL
TRAMADOL IA: NEGATIVE ng/mL
Temazepam: NOT DETECTED ng/mg{creat}

## 2023-11-12 LAB — CANNABINOIDS, MS, UR RFX
Cannabinoids Confirmation: POSITIVE
Carboxy-THC: 1 ng/mg{creat}

## 2023-11-12 LAB — COCAINE AND MTB, MS, UR RFX
Benzoylecgonine: >666 ng/mg{creat}
Cocaethylene: NOT DETECTED ng/mg{creat}
Cocaine Confirmation: POSITIVE
Cocaine: 379 ng/mg{creat}

## 2023-11-12 LAB — AMPHETAMINES, MS, UR RFX
Amphetamine: NOT DETECTED ng/mg{creat}
Amphetamines Confirmation: NEGATIVE
MDA (Ecstasy metabolite): NOT DETECTED ng/mg{creat}
MDMA (Ecstasy): NOT DETECTED ng/mg{creat}
Methamphetamine: NOT DETECTED ng/mg{creat}

## 2023-11-18 ENCOUNTER — Other Ambulatory Visit (HOSPITAL_COMMUNITY): Payer: Self-pay

## 2023-11-22 DIAGNOSIS — Z419 Encounter for procedure for purposes other than remedying health state, unspecified: Secondary | ICD-10-CM | POA: Diagnosis not present

## 2023-11-27 ENCOUNTER — Encounter (HOSPITAL_COMMUNITY): Payer: Self-pay

## 2023-11-27 ENCOUNTER — Other Ambulatory Visit (HOSPITAL_COMMUNITY): Payer: Self-pay

## 2023-11-27 ENCOUNTER — Other Ambulatory Visit: Payer: Self-pay

## 2023-11-27 ENCOUNTER — Other Ambulatory Visit: Payer: Self-pay | Admitting: Family Medicine

## 2023-11-27 DIAGNOSIS — F1190 Opioid use, unspecified, uncomplicated: Secondary | ICD-10-CM

## 2023-11-27 MED ORDER — SUBLOCADE 300 MG/1.5ML ~~LOC~~ SOSY
300.0000 mg | PREFILLED_SYRINGE | Freq: Once | SUBCUTANEOUS | 0 refills | Status: AC
  Filled 2023-11-27: qty 1.5, 28d supply, fill #0

## 2023-11-27 NOTE — Progress Notes (Signed)
 Specialty Pharmacy Initial Fill Coordination Note  Sandra Matthews is a 28 y.o. female contacted today regarding initial fill of specialty medication(s) Buprenorphine  (Sublocade )   Patient requested Courier to Provider Office   Delivery date: 12/01/23   Verified address: [facility] for [facility] at [facility]  [address]   Medication will be filled on 2/7.   Patient is aware of $0 copayment.   Same day courier and signature required

## 2023-11-27 NOTE — Progress Notes (Signed)
 Sublocade order for visit next week

## 2023-11-28 ENCOUNTER — Other Ambulatory Visit: Payer: Self-pay

## 2023-11-29 ENCOUNTER — Other Ambulatory Visit (HOSPITAL_COMMUNITY): Payer: Self-pay

## 2023-12-01 ENCOUNTER — Other Ambulatory Visit: Payer: Self-pay

## 2023-12-02 ENCOUNTER — Ambulatory Visit: Payer: Medicaid Other | Admitting: Family Medicine

## 2023-12-03 ENCOUNTER — Other Ambulatory Visit (HOSPITAL_COMMUNITY): Payer: Self-pay

## 2023-12-03 ENCOUNTER — Other Ambulatory Visit: Payer: Self-pay

## 2023-12-03 ENCOUNTER — Other Ambulatory Visit: Payer: Self-pay | Admitting: Family Medicine

## 2023-12-03 DIAGNOSIS — F1190 Opioid use, unspecified, uncomplicated: Secondary | ICD-10-CM

## 2023-12-04 ENCOUNTER — Other Ambulatory Visit (HOSPITAL_COMMUNITY): Payer: Self-pay

## 2023-12-04 MED ORDER — BUPRENORPHINE HCL-NALOXONE HCL 8-2 MG SL FILM
1.0000 | ORAL_FILM | Freq: Three times a day (TID) | SUBLINGUAL | 0 refills | Status: DC
  Filled 2023-12-04: qty 63, 21d supply, fill #0

## 2023-12-16 ENCOUNTER — Ambulatory Visit: Payer: Medicaid Other | Admitting: Family Medicine

## 2023-12-17 ENCOUNTER — Other Ambulatory Visit: Payer: Self-pay

## 2023-12-20 DIAGNOSIS — Z419 Encounter for procedure for purposes other than remedying health state, unspecified: Secondary | ICD-10-CM | POA: Diagnosis not present

## 2023-12-25 ENCOUNTER — Other Ambulatory Visit (HOSPITAL_COMMUNITY): Payer: Self-pay

## 2023-12-25 ENCOUNTER — Ambulatory Visit: Admitting: Family Medicine

## 2023-12-25 ENCOUNTER — Other Ambulatory Visit: Payer: Self-pay

## 2023-12-25 VITALS — BP 136/97 | HR 94 | Wt 138.4 lb

## 2023-12-25 DIAGNOSIS — R5383 Other fatigue: Secondary | ICD-10-CM | POA: Diagnosis not present

## 2023-12-25 DIAGNOSIS — F1190 Opioid use, unspecified, uncomplicated: Secondary | ICD-10-CM | POA: Diagnosis not present

## 2023-12-25 DIAGNOSIS — F32A Depression, unspecified: Secondary | ICD-10-CM

## 2023-12-25 DIAGNOSIS — F419 Anxiety disorder, unspecified: Secondary | ICD-10-CM | POA: Diagnosis not present

## 2023-12-25 LAB — POCT HEMOGLOBIN-HEMACUE: Hemoglobin: 11.7 g/dL — ABNORMAL LOW (ref 12.0–15.0)

## 2023-12-25 MED ORDER — BUPRENORPHINE HCL-NALOXONE HCL 8-2 MG SL FILM
1.0000 | ORAL_FILM | Freq: Three times a day (TID) | SUBLINGUAL | 0 refills | Status: AC
  Filled 2023-12-25: qty 63, 21d supply, fill #0

## 2023-12-25 NOTE — Progress Notes (Signed)
 Sandra Matthews is a 28 y.o. [phone] here today for OUD follow up.  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[employer] [employer] Maintenance Due  Topic Date Due   Pneumococcal Vaccine 29-68 Years old (1 of 2 - PCV) Never done   INFLUENZA VACCINE  05/22/2023   COVID-19 Vaccine (1 - 2024-25 season) Never done    Past Medical History:  Diagnosis Date   Anemia    Anxiety and depression 02/04/2023   Depression 09/27/2022   PP stuff, was thinking of calling and talking with someone   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   + THC 01/31/2020   Preterm labor    Supervision of other high risk pregnancy, antepartum 11/13/2022          Nursing Staff  Provider  Office Location  [facility]  Dating   05/08/2023, by Last Menstrual Period  [facility] Model  [X]  Traditional  [ ]  Centering  [ ]  Mom-Baby Dyad        Language   English  Anatomy US   Normal>IUGR  Flu Vaccine      Genetic/Carrier Screen   NIPS:   low risk female  AFP:   not done  Horizon: neg 14/14  TDaP Vaccine    Deferred on 02/04/23 (would like at next visit)   Hgb A1C or   GTT    Past Surgical History:  Procedure Laterality Date   FRACTURE SURGERY     broken arm in childhood, cast only, no surgery    The following portions of the patient's history were reviewed and updated as appropriate: allergies, current medications, past family history, past medical history, past social history, past surgical history and problem list.   Health  Maintenance:   Last pap:  Result Date Procedure Results Follow-ups  11/13/2022 Cytology - PAP Adequacy: Satisfactory for evaluation.  The absence of an Adequacy: endocervical/transformation zone component is not uncommon in pregnant Adequacy: patients. Diagnosis: - Negative for Intraepithelial Lesions or Malignancy (NILM) Diagnosis: - Benign reactive/reparative changes   09/17/2021 Surgical pathology SURGICAL PATHOLOGY: SURGICAL PATHOLOGY CASE: [id number] PATIENT: [facility] Surgical Pathology Report     Clinical History: ASCUS, + HPV (cm)   FINAL MICROSCOPIC DIAGNOSIS:  A. ENDOCERVIX, CURETTAGE: -  Benign endocervical glandular epithelium -  ...   08/01/2021 Cytology - PAP( [place]) High risk HPV: Positive (A) Adequacy: Satisfactory for evaluation; transformation zone component PRESENT. Diagnosis: - Atypical squamous cells of undetermined significance (ASC-US) (A) Comment: Normal Reference Range HPV - Negative   12/17/2017 Cytology - PAP Adequacy: Satisfactory for evaluation  endocervical/transformation zone component PRESENT. Diagnosis: NEGATIVE FOR INTRAEPITHELIAL LESIONS OR MALIGNANCY. Diagnosis: FUNGAL ORGANISMS PRESENT CONSISTENT WITH CANDIDA SPP. Chlamydia: Negative Neisseria Gonorrhea: Negative Material Submitted: CervicoVaginal Pap [ThinPrep Imaged] CYTOLOGY - PAP: PAP RESULT      Last mammogram:  N/a    Hepatitis serologies: No results found for: "HAV", "HEPAIGM", "HEPBIGM", "HEPBCAB", "HBEAG", "HEPCAB"  Hep A Immunization: deferred to  future visit  Hep B Immunization: deferred to future visit  Last LFTs: Lab Results  Component Value Date   ALT 26 10/23/2022   AST 19 10/23/2022   ALKPHOS 45 10/23/2022   BILITOT 0.5 10/23/2022     Review of Systems:  Pertinent items noted in HPI and remainder of comprehensive ROS otherwise negative.  Physical Exam:  BP (!) 136/97   Pulse 94   Wt 138 lb 6.4 oz (62.8 kg)   BMI 25.31 kg/m   CONSTITUTIONAL: Well-developed, well-nourished female in no acute distress.  HEENT:  Normocephalic, atraumatic. External right and left ear normal. No scleral icterus.  NECK: Normal range of motion, supple, no masses noted on observation SKIN: No rash noted. Not diaphoretic. No erythema. No pallor. MUSCULOSKELETAL: Normal range of motion. No edema noted. NEUROLOGIC: Alert and oriented to person, place, and time. Normal muscle tone coordination.  PSYCHIATRIC: Normal mood and affect. Normal behavior. Normal judgment and thought content. RESPIRATORY: Effort normal, no problems with respiration noted   Labs and Imaging I have reviewed the PDMP during this encounter.    Last UDS: Lab Results  Component Value Date   PD2 FINAL 11/06/2023   CREATIUR 751 11/06/2023     Results for orders placed or performed in visit on 12/25/23 (from the past week)  Hemoglobin-hemacue, POC   Collection Time: 12/25/23  1:47 PM  Result Value Ref Range   Hemoglobin 11.7 (L) 12.0 - 15.0 g/dL   No results found.      Assessment and Plan:   Problem List Items Addressed This Visit       Other   Anxiety and depression   Opioid use disorder - Primary   Stable. Continue to support in her efforts to stop stimulant use. Long discussion around sublocade, discussed many patients find they need to use SL bup on top of the sublocade especially the first month or two. She would like to get her first injection at her next visit.       Relevant Medications   Buprenorphine HCl-Naloxone HCl (SUBOXONE) 8-2 MG FILM   Other Relevant Orders   ToxAssure Flex 15, Ur      Return in about 2 weeks (around 01/08/2024) for REACH clinic.    Total face-to-face time with patient: 20 minutes.  Over 50% of encounter was spent on counseling and coordination of care.   Venora Maples, MD/MPH Attending Family Medicine Physician, [facility] for [facility], [facility]

## 2023-12-25 NOTE — Assessment & Plan Note (Signed)
 Stable. Continue to support in her efforts to stop stimulant use. Long discussion around sublocade, discussed many patients find they need to use SL bup on top of the sublocade especially the first month or two. She would like to get her first injection at her next visit.

## 2023-12-28 LAB — TOXASSURE FLEX 15, UR
6-ACETYLMORPHINE IA: NEGATIVE ng/mL
7-aminoclonazepam: NOT DETECTED ng/mg{creat}
AMPHETAMINES IA: NEGATIVE ng/mL
Alpha-hydroxyalprazolam: NOT DETECTED ng/mg{creat}
Alpha-hydroxymidazolam: NOT DETECTED ng/mg{creat}
Alpha-hydroxytriazolam: NOT DETECTED ng/mg{creat}
Alprazolam: NOT DETECTED ng/mg{creat}
BARBITURATES IA: NEGATIVE ng/mL
BUPRENORPHINE: POSITIVE
Benzodiazepines: NEGATIVE
Buprenorphine: 116 ng/mg{creat}
CANNABINOIDS IA: NEGATIVE ng/mL
Clonazepam: NOT DETECTED ng/mg{creat}
Creatinine: 97 mg/dL
Desalkylflurazepam: NOT DETECTED ng/mg{creat}
Desmethyldiazepam: NOT DETECTED ng/mg{creat}
Desmethylflunitrazepam: NOT DETECTED ng/mg{creat}
Diazepam: NOT DETECTED ng/mg{creat}
ETHYL ALCOHOL Enzymatic: NEGATIVE g/dL
FENTANYL: NEGATIVE
Fentanyl: NOT DETECTED ng/mg{creat}
Flunitrazepam: NOT DETECTED ng/mg{creat}
Lorazepam: NOT DETECTED ng/mg{creat}
METHADONE IA: NEGATIVE ng/mL
METHADONE MTB IA: NEGATIVE ng/mL
Midazolam: NOT DETECTED ng/mg{creat}
Norbuprenorphine: 521 ng/mg{creat}
Norfentanyl: NOT DETECTED ng/mg{creat}
OPIATE CLASS IA: NEGATIVE ng/mL
OXYCODONE CLASS IA: NEGATIVE ng/mL
Oxazepam: NOT DETECTED ng/mg{creat}
PHENCYCLIDINE IA: NEGATIVE ng/mL
TAPENTADOL, IA: NEGATIVE ng/mL
TRAMADOL IA: NEGATIVE ng/mL
Temazepam: NOT DETECTED ng/mg{creat}

## 2023-12-28 LAB — COCAINE AND MTB, MS, UR RFX
Benzoylecgonine: >5155 ng/mg{creat}
Cocaethylene: NOT DETECTED ng/mg{creat}
Cocaine Confirmation: POSITIVE
Cocaine: 2315 ng/mg{creat}

## 2024-01-06 ENCOUNTER — Ambulatory Visit: Admitting: Family Medicine

## 2024-01-07 ENCOUNTER — Other Ambulatory Visit: Payer: Self-pay

## 2024-01-19 ENCOUNTER — Encounter: Payer: Self-pay | Admitting: Family Medicine

## 2024-01-19 ENCOUNTER — Other Ambulatory Visit (HOSPITAL_COMMUNITY): Payer: Self-pay

## 2024-01-19 DIAGNOSIS — F1190 Opioid use, unspecified, uncomplicated: Secondary | ICD-10-CM

## 2024-01-19 MED ORDER — BUPRENORPHINE HCL-NALOXONE HCL 8-2 MG SL FILM
1.0000 | ORAL_FILM | Freq: Three times a day (TID) | SUBLINGUAL | 0 refills | Status: DC
  Filled 2024-01-19: qty 21, 7d supply, fill #0

## 2024-01-20 ENCOUNTER — Ambulatory Visit: Admitting: Family Medicine

## 2024-01-20 ENCOUNTER — Other Ambulatory Visit (HOSPITAL_COMMUNITY): Payer: Self-pay

## 2024-01-27 ENCOUNTER — Ambulatory Visit

## 2024-01-27 ENCOUNTER — Other Ambulatory Visit (HOSPITAL_COMMUNITY): Payer: Self-pay

## 2024-01-27 ENCOUNTER — Encounter: Payer: Self-pay | Admitting: Family Medicine

## 2024-01-27 ENCOUNTER — Other Ambulatory Visit: Payer: Self-pay

## 2024-01-27 ENCOUNTER — Ambulatory Visit (INDEPENDENT_AMBULATORY_CARE_PROVIDER_SITE_OTHER): Admitting: Family Medicine

## 2024-01-27 VITALS — BP 126/84 | HR 99

## 2024-01-27 DIAGNOSIS — F419 Anxiety disorder, unspecified: Secondary | ICD-10-CM | POA: Diagnosis not present

## 2024-01-27 DIAGNOSIS — F1190 Opioid use, unspecified, uncomplicated: Secondary | ICD-10-CM | POA: Diagnosis not present

## 2024-01-27 DIAGNOSIS — F32A Depression, unspecified: Secondary | ICD-10-CM | POA: Diagnosis not present

## 2024-01-27 DIAGNOSIS — F331 Major depressive disorder, recurrent, moderate: Secondary | ICD-10-CM | POA: Diagnosis not present

## 2024-01-27 MED ORDER — BUPRENORPHINE HCL-NALOXONE HCL 8-2 MG SL FILM
1.0000 | ORAL_FILM | Freq: Three times a day (TID) | SUBLINGUAL | 0 refills | Status: DC
  Filled 2024-01-27: qty 90, 30d supply, fill #0

## 2024-01-27 NOTE — Assessment & Plan Note (Signed)
 Still struggling with cocaine use disorder, but doing well with OUD. Refill sent, UDS collected. No recent LFTs, also need to figure out hepatitis serologies, ordered for all of the above. Will see back in 1 month but need to think about transferring. Message sent to [facility] colleagues to see about transitioning from [facility] clinic.

## 2024-01-27 NOTE — Progress Notes (Signed)
 Sandra Matthews is a 28 y.o. [phone] here today for follow up.  Still struggling with cocaine use, but doing well with suboxone Has made an appt with Sandra Matthews for mental health Not taking celexa, has been using buspar and hydroxyzine though doesn't feel like they're helping Looking for a primary care doctor as well  Health Maintenance Due  Topic Date Due   Pneumococcal Vaccine 28-33 Years old (1 of 2 - PCV) Never done   COVID-19 Vaccine (1 - 2024-25 season) Never done    Past Medical History:  Diagnosis Date   Anemia    Anxiety and depression 02/04/2023   Depression 09/27/2022   PP stuff, was thinking of calling and talking with someone   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   + THC 01/31/2020   Preterm labor    Supervision of other high risk pregnancy, antepartum 11/13/2022          Nursing Staff  Provider  Office Location  Sandra Matthews  Dating   05/08/2023, by Last Menstrual Period  Sandra Matthews Model  [X]  Traditional  [ ]  Centering  [ ]  Mom-Baby Dyad        Language   English  Anatomy US   Normal>IUGR  Flu Vaccine      Genetic/Carrier Screen   NIPS:   low risk female  AFP:   not done  Horizon: neg 14/14  TDaP Vaccine    Deferred on 02/04/23 (would like at next visit)   Hgb A1C or   GTT    Past Surgical History:  Procedure Laterality Date   Sandra Matthews     broken arm in childhood, cast only, no Matthews    Sandra following portions of Sandra patient's history were reviewed and updated as appropriate: allergies, current medications, past family history, past medical history, past social history, past surgical history and problem list.   Health Maintenance:   Last pap:  Result Date Procedure Results Follow-ups  11/13/2022 Cytology - PAP Adequacy: Satisfactory for evaluation.  Sandra absence of an Adequacy: endocervical/transformation zone component is not uncommon in pregnant Adequacy: patients. Diagnosis: - Negative for Intraepithelial Lesions or Malignancy (NILM) Diagnosis: - Benign  reactive/reparative changes   09/17/2021 Surgical pathology SURGICAL PATHOLOGY: SURGICAL PATHOLOGY CASE: [id number] PATIENT: Sandra Matthews Surgical Pathology Report     Clinical History: ASCUS, + HPV (cm)   FINAL MICROSCOPIC DIAGNOSIS:  A. ENDOCERVIX, CURETTAGE: -  Benign endocervical glandular epithelium -  ...   08/01/2021 Cytology - PAP( Sandra Matthews) High risk HPV: Positive (A) Adequacy: Satisfactory for evaluation; transformation zone component PRESENT. Diagnosis: - Atypical squamous cells of undetermined significance (ASC-US) (A) Comment: Normal Reference Range HPV - Negative   12/17/2017 Cytology - PAP Adequacy: Satisfactory for evaluation  endocervical/transformation zone component PRESENT. Diagnosis: NEGATIVE FOR INTRAEPITHELIAL LESIONS OR MALIGNANCY. Diagnosis: FUNGAL ORGANISMS PRESENT CONSISTENT WITH CANDIDA SPP. Chlamydia: Negative Neisseria Gonorrhea: Negative Material Submitted: CervicoVaginal Pap [ThinPrep Imaged] CYTOLOGY - PAP: PAP RESULT     Last mammogram:  N/a    Hepatitis serologies: No results found for: "HAV", "HEPAIGM", "HEPBIGM", "HEPBCAB", "HBEAG", "HEPCAB"  Hep A Immunization: serologies collected today  Hep B Immunization: serologies collected today  Last LFTs: Lab Results  Component Value Date   ALT 26 10/23/2022   AST 19 10/23/2022   ALKPHOS 45 10/23/2022   BILITOT 0.5 10/23/2022     Review of Systems:  Pertinent items noted in HPI and remainder of comprehensive ROS otherwise negative.  Physical Exam:  BP 126/84   Pulse 99  CONSTITUTIONAL:  Well-developed, well-nourished female in no acute distress.  HEENT:  Normocephalic, atraumatic. External right and left ear normal. No scleral icterus.  NECK: Normal range of motion, supple, no masses noted on observation SKIN: No rash noted. Not diaphoretic. No erythema. No pallor. MUSCULOSKELETAL: Normal range of motion. No edema noted. NEUROLOGIC: Alert and oriented to person,  place, and time. Normal muscle tone coordination.  PSYCHIATRIC: Normal mood and affect. Normal behavior. Normal judgment and thought content. RESPIRATORY: Effort normal, no problems with respiration noted  Labs and Imaging I have reviewed Sandra PDMP during this encounter.    Last UDS: Lab Results  Component Value Date   CREATIUR 97 12/25/2023     Assessment and Plan:   Problem List Items Addressed This Visit       Other   Anxiety and depression   Establishing with Sandra Matthews which I am thrilled about, defer management of meds to them.       Opioid use disorder - Primary   Still struggling with cocaine use disorder, but doing well with OUD. Refill sent, UDS collected. No recent LFTs, also need to figure out hepatitis serologies, ordered for all of Sandra above. Will see back in 1 month but need to think about transferring. Message sent to Sandra Matthews colleagues to see about transitioning from Sandra Matthews.       Relevant Medications   Buprenorphine HCl-Naloxone HCl (SUBOXONE) 8-2 MG FILM   Other Relevant Orders   Hepatitis A Ab, Total   Hepatitis B surface antibody,quantitative   Hepatic function panel   ToxAssure Flex 15, Ur      Return in about 4 weeks (around 02/24/2024) for Sandra Matthews.    Total face-to-face time with patient: 25 minutes.  Over 50% of encounter was spent on counseling and coordination of care.   Sandra Maples, MD/MPH Attending Family Medicine Physician, Sandra Matthews, Sandra Matthews

## 2024-01-27 NOTE — Assessment & Plan Note (Signed)
 Establishing with [facility] which I am thrilled about, defer management of meds to them.

## 2024-01-28 ENCOUNTER — Encounter: Payer: Self-pay | Admitting: Family Medicine

## 2024-01-28 LAB — HEPATIC FUNCTION PANEL
ALT: 9 IU/L (ref 0–32)
AST: 11 IU/L (ref 0–40)
Albumin: 4.2 g/dL (ref 4.0–5.0)
Alkaline Phosphatase: 108 IU/L (ref 44–121)
Bilirubin Total: <0.2 mg/dL (ref 0.0–1.2)
Bilirubin, Direct: <0.08 mg/dL (ref 0.00–0.40)
Total Protein: 7.1 g/dL (ref 6.0–8.5)

## 2024-01-28 LAB — HEPATITIS A ANTIBODY, TOTAL: hep A Total Ab: NEGATIVE

## 2024-01-28 LAB — HEPATITIS B SURFACE ANTIBODY, QUANTITATIVE: Hepatitis B Surf Ab Quant: <3.5 m[IU]/mL — ABNORMAL LOW

## 2024-01-30 LAB — COCAINE AND MTB, MS, UR RFX
Benzoylecgonine: >10204 ng/mg{creat}
Cocaethylene: NOT DETECTED ng/mg{creat}
Cocaine Confirmation: POSITIVE
Cocaine: >10204 ng/mg{creat}

## 2024-01-30 LAB — TOXASSURE FLEX 15, UR
6-ACETYLMORPHINE IA: NEGATIVE ng/mL
7-aminoclonazepam: NOT DETECTED ng/mg{creat}
AMPHETAMINES IA: NEGATIVE ng/mL
Alpha-hydroxyalprazolam: NOT DETECTED ng/mg{creat}
Alpha-hydroxymidazolam: NOT DETECTED ng/mg{creat}
Alpha-hydroxytriazolam: NOT DETECTED ng/mg{creat}
Alprazolam: NOT DETECTED ng/mg{creat}
BARBITURATES IA: NEGATIVE ng/mL
BUPRENORPHINE: POSITIVE
Benzodiazepines: NEGATIVE
Buprenorphine: 118 ng/mg{creat}
Clonazepam: NOT DETECTED ng/mg{creat}
Creatinine: 49 mg/dL
Desalkylflurazepam: NOT DETECTED ng/mg{creat}
Desmethyldiazepam: NOT DETECTED ng/mg{creat}
Desmethylflunitrazepam: NOT DETECTED ng/mg{creat}
Diazepam: NOT DETECTED ng/mg{creat}
ETHYL ALCOHOL Enzymatic: NEGATIVE g/dL
FENTANYL: NEGATIVE
Fentanyl: NOT DETECTED ng/mg{creat}
Flunitrazepam: NOT DETECTED ng/mg{creat}
Lorazepam: NOT DETECTED ng/mg{creat}
METHADONE IA: NEGATIVE ng/mL
METHADONE MTB IA: NEGATIVE ng/mL
Midazolam: NOT DETECTED ng/mg{creat}
Norbuprenorphine: 365 ng/mg{creat}
Norfentanyl: NOT DETECTED ng/mg{creat}
OPIATE CLASS IA: NEGATIVE ng/mL
OXYCODONE CLASS IA: NEGATIVE ng/mL
Oxazepam: NOT DETECTED ng/mg{creat}
PHENCYCLIDINE IA: NEGATIVE ng/mL
TAPENTADOL, IA: NEGATIVE ng/mL
TRAMADOL IA: NEGATIVE ng/mL
Temazepam: NOT DETECTED ng/mg{creat}

## 2024-01-30 LAB — CANNABINOIDS, MS, UR RFX
Cannabinoids Confirmation: POSITIVE
Carboxy-THC: 12 ng/mg{creat}

## 2024-01-31 DIAGNOSIS — Z419 Encounter for procedure for purposes other than remedying health state, unspecified: Secondary | ICD-10-CM | POA: Diagnosis not present

## 2024-02-10 ENCOUNTER — Ambulatory Visit: Admitting: Family Medicine

## 2024-02-20 ENCOUNTER — Other Ambulatory Visit (HOSPITAL_COMMUNITY): Payer: Self-pay

## 2024-02-20 DIAGNOSIS — F331 Major depressive disorder, recurrent, moderate: Secondary | ICD-10-CM | POA: Diagnosis not present

## 2024-02-20 MED ORDER — FLUOXETINE HCL 20 MG PO CAPS
ORAL_CAPSULE | ORAL | 1 refills | Status: AC
  Filled 2024-02-20 (×2): qty 60, 33d supply, fill #0

## 2024-02-20 MED ORDER — HYDROXYZINE HCL 25 MG PO TABS
25.0000 mg | ORAL_TABLET | Freq: Every day | ORAL | 1 refills | Status: DC
  Filled 2024-02-20: qty 30, 30d supply, fill #0
  Filled 2024-03-29: qty 30, 30d supply, fill #1

## 2024-02-23 ENCOUNTER — Telehealth: Payer: Self-pay | Admitting: Family Medicine

## 2024-02-23 ENCOUNTER — Other Ambulatory Visit (HOSPITAL_COMMUNITY): Payer: Self-pay

## 2024-02-23 NOTE — Telephone Encounter (Signed)
 Rx for SUBOXONE ) ,  state she is feeling really sick

## 2024-02-24 ENCOUNTER — Other Ambulatory Visit (HOSPITAL_COMMUNITY): Payer: Self-pay

## 2024-02-24 ENCOUNTER — Ambulatory Visit: Admitting: Family Medicine

## 2024-02-24 ENCOUNTER — Encounter: Payer: Self-pay | Admitting: Family Medicine

## 2024-02-24 VITALS — BP 118/82 | HR 99 | Wt 130.8 lb

## 2024-02-24 DIAGNOSIS — F1190 Opioid use, unspecified, uncomplicated: Secondary | ICD-10-CM

## 2024-02-24 DIAGNOSIS — F419 Anxiety disorder, unspecified: Secondary | ICD-10-CM | POA: Diagnosis not present

## 2024-02-24 DIAGNOSIS — F32A Depression, unspecified: Secondary | ICD-10-CM

## 2024-02-24 MED ORDER — BUPRENORPHINE HCL-NALOXONE HCL 8-2 MG SL FILM
1.0000 | ORAL_FILM | Freq: Four times a day (QID) | SUBLINGUAL | 0 refills | Status: AC
  Filled 2024-02-24: qty 120, 30d supply, fill #0

## 2024-02-24 NOTE — Assessment & Plan Note (Addendum)
 She is taking suboxone , encouraged her to be more open and let us  know if she needs dose change. New rx sent for QID dosing. UDS collected today. Discussed it will be very difficult to avoid using cocaine  and other substances without making fundamental changes to the architecture of her life, and serious consideration of whether her cousin is someone she wants to be around.  Care coordinated and patient scheduled for f/u visit with Dr. Bevin Bucks at [facility] now that she is almost 1 year postpartum. We were both sad this is our last visit but I encouraged her to return for routine OBGYN care or even just for a check in.  At last visit serologies show she is non-immune to hepatitis A and B, discussed need to get these at PCP office.

## 2024-02-24 NOTE — Progress Notes (Signed)
 Sandra Matthews is a 28 y.o. [phone] here today for OUD follow up.  Still struggling with her anxiety and depression Seeing [facility] and planning to start counseling with them as well  Was having cravings and withdrawal symptoms on TID dosing so has been taking QID and has mostly run out at this point Still hanging out with her cousin who is often encouraging her to use cocaine  with him  Health Maintenance Due  Topic Date Due   Pneumococcal Vaccine 12-37 Years old (1 of 2 - PCV) Never done   COVID-19 Vaccine (1 - 2024-25 season) Never done    Past Medical History:  Diagnosis Date   Anemia    Anxiety and depression 02/04/2023   Depression 09/27/2022   PP stuff, was thinking of calling and talking with someone   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   + THC 01/31/2020   Preterm labor    Supervision of other high risk pregnancy, antepartum 11/13/2022          Nursing Staff  Provider  Office Location  [facility]  Dating   05/08/2023, by Last Menstrual Period  [facility] Model  [X]  Traditional  [ ]  Centering  [ ]  Mom-Baby Dyad        Language   English  Anatomy US    Normal>IUGR  Flu Vaccine      Genetic/Carrier Screen   NIPS:   low risk female  AFP:   not done  Horizon: neg 14/14  TDaP Vaccine    Deferred on 02/04/23 (would like at next visit)   Hgb A1C or   GTT    Past Surgical History:  Procedure Laterality Date   FRACTURE SURGERY     broken arm in childhood, cast only, no surgery    The following portions of the patient's history were reviewed and updated as appropriate: allergies, current medications, past family history, past medical history, past social history, past surgical history and problem list.   Health Maintenance:   Last pap:  Result Date Procedure Results Follow-ups  11/13/2022 Cytology - PAP Adequacy: Satisfactory for evaluation.  The absence of an Adequacy: endocervical/transformation zone component is not uncommon in pregnant Adequacy: patients. Diagnosis: - Negative for  Intraepithelial Lesions or Malignancy (NILM) Diagnosis: - Benign reactive/reparative changes   09/17/2021 Surgical pathology SURGICAL PATHOLOGY: SURGICAL PATHOLOGY CASE: [id number] PATIENT: [facility] Surgical Pathology Report     Clinical History: ASCUS, + HPV (cm)   FINAL MICROSCOPIC DIAGNOSIS:  A. ENDOCERVIX, CURETTAGE: -  Benign endocervical glandular epithelium -  ...   08/01/2021 Cytology - PAP( [facility]) High risk HPV: Positive (A) Adequacy: Satisfactory for evaluation; transformation zone component PRESENT. Diagnosis: - Atypical squamous cells of undetermined significance (ASC-US ) (A) Comment: Normal Reference Range HPV - Negative   12/17/2017 Cytology - PAP Adequacy: Satisfactory for evaluation  endocervical/transformation zone component PRESENT. Diagnosis: NEGATIVE FOR INTRAEPITHELIAL LESIONS OR MALIGNANCY. Diagnosis: FUNGAL ORGANISMS PRESENT CONSISTENT WITH CANDIDA SPP. Chlamydia: Negative Neisseria Gonorrhea: Negative Material Submitted: CervicoVaginal Pap [ThinPrep Imaged] CYTOLOGY - PAP: PAP RESULT     Last mammogram:  N/a    Hepatitis serologies: Lab Results  Component Value Date   HAV Negative 01/27/2024    Hep A Immunization: *needs immunization*, defer to PCP  Hep B Immunization: *needs immunization*, defer to PCP  Last LFTs: Lab Results  Component Value Date   ALT 9 01/27/2024   AST 11 01/27/2024   ALKPHOS 108 01/27/2024   BILITOT <0.2 01/27/2024     Review of Systems:  Pertinent  items noted in HPI and remainder of comprehensive ROS otherwise negative.  Physical Exam:  BP 118/82   Pulse 99   Wt 130 lb 12.8 oz (59.3 kg)   Breastfeeding No   BMI 23.92 kg/m  CONSTITUTIONAL: Well-developed, well-nourished female in no acute distress.  HEENT:  Normocephalic, atraumatic. External right and left ear normal. No scleral icterus.  NECK: Normal range of motion, supple, no masses noted on observation SKIN: No rash noted. Not  diaphoretic. No erythema. No pallor. MUSCULOSKELETAL: Normal range of motion. No edema noted. NEUROLOGIC: Alert and oriented to person, place, and time. Normal muscle tone coordination.  PSYCHIATRIC: Normal mood and affect. Normal behavior. Normal judgment and thought content. RESPIRATORY: Effort normal, no problems with respiration noted  Labs and Imaging PDMP not reviewed this encounter.    Last UDS: Lab Results  Component Value Date   CREATIUR 49 01/27/2024     Assessment and Plan:   Problem List Items Addressed This Visit       Other   Anxiety and depression   Going to [facility], feels that it is helping, they are managing her meds.       Opioid use disorder - Primary   She is taking suboxone , encouraged her to be more open and let us  know if she needs dose change. New rx sent for QID dosing. UDS collected today. Discussed it will be very difficult to avoid using cocaine  and other substances without making fundamental changes to the architecture of her life, and serious consideration of whether her cousin is someone she wants to be around.  Care coordinated and patient scheduled for f/u visit with Dr. Bevin Bucks at [facility] now that she is almost 1 year postpartum. We were both sad this is our last visit but I encouraged her to return for routine OBGYN care or even just for a check in.  At last visit serologies show she is non-immune to hepatitis A and B, discussed need to get these at PCP office.       Relevant Medications   Buprenorphine  HCl-Naloxone  HCl (SUBOXONE ) 8-2 MG FILM   Other Relevant Orders   ToxAssure Flex 15, Ur      No follow-ups on file.    Total face-to-face time with patient: 20 minutes.  Over 50% of encounter was spent on counseling and coordination of care.  Future Appointments  Date Time Provider Department Center  02/24/2024  3:15 PM Teena Feast, MD [facility]  03/23/2024  8:50 AM Azell Boll, MD [facility]    Teena Feast,  MD/MPH Attending Family Medicine Physician, [facility] for [facility], [facility]

## 2024-02-24 NOTE — Assessment & Plan Note (Addendum)
 Going to [place], feels that it is helping, they are managing her meds.

## 2024-02-29 LAB — OPIATE CLASS, MS, UR RFX
Codeine: NOT DETECTED ng/mg{creat}
Dihydrocodeine: NOT DETECTED ng/mg{creat}
Hydrocodone: NOT DETECTED ng/mg{creat}
Hydromorphone: NOT DETECTED ng/mg{creat}
Morphine: NOT DETECTED ng/mg{creat}
Norcodeine: NOT DETECTED ng/mg{creat}
Norhydrocodone: NOT DETECTED ng/mg{creat}
Normorphine: NOT DETECTED ng/mg{creat}
Opiate Class Confirmation: NEGATIVE

## 2024-02-29 LAB — OXYCODONE CLASS, MS, UR RFX
Noroxycodone: 742 ng/mg{creat}
Noroxymorphone: 63 ng/mg{creat}
Oxycodone Class Confirmation: POSITIVE
Oxycodone: 106 ng/mg{creat}
Oxymorphone: 339 ng/mg{creat}

## 2024-02-29 LAB — TOXASSURE FLEX 15, UR
6-ACETYLMORPHINE IA: NEGATIVE ng/mL
7-aminoclonazepam: NOT DETECTED ng/mg{creat}
AMPHETAMINES IA: NEGATIVE ng/mL
Alpha-hydroxyalprazolam: NOT DETECTED ng/mg{creat}
Alpha-hydroxymidazolam: NOT DETECTED ng/mg{creat}
Alpha-hydroxytriazolam: NOT DETECTED ng/mg{creat}
Alprazolam: NOT DETECTED ng/mg{creat}
BARBITURATES IA: NEGATIVE ng/mL
BUPRENORPHINE: POSITIVE
Benzodiazepines: NEGATIVE
Buprenorphine: 17 ng/mg{creat}
Clonazepam: NOT DETECTED ng/mg{creat}
Creatinine: 345 mg/dL (ref >=20)
Desalkylflurazepam: NOT DETECTED ng/mg{creat}
Desmethyldiazepam: NOT DETECTED ng/mg{creat}
Desmethylflunitrazepam: NOT DETECTED ng/mg{creat}
Diazepam: NOT DETECTED ng/mg{creat}
ETHYL ALCOHOL Enzymatic: NEGATIVE g/dL
FENTANYL: NEGATIVE
Fentanyl: NOT DETECTED ng/mg{creat}
Flunitrazepam: NOT DETECTED ng/mg{creat}
Lorazepam: NOT DETECTED ng/mg{creat}
METHADONE IA: NEGATIVE ng/mL
METHADONE MTB IA: NEGATIVE ng/mL
Midazolam: NOT DETECTED ng/mg{creat}
Norbuprenorphine: 79 ng/mg{creat}
Norfentanyl: NOT DETECTED ng/mg{creat}
Oxazepam: NOT DETECTED ng/mg{creat}
PHENCYCLIDINE IA: NEGATIVE ng/mL
TAPENTADOL, IA: NEGATIVE ng/mL
TRAMADOL IA: NEGATIVE ng/mL
Temazepam: NOT DETECTED ng/mg{creat}

## 2024-02-29 LAB — COCAINE AND MTB, MS, UR RFX
Benzoylecgonine: >1449 ng/mg{creat}
Cocaethylene: NOT DETECTED ng/mg{creat}
Cocaine Confirmation: POSITIVE
Cocaine: >1449 ng/mg{creat}

## 2024-02-29 LAB — CANNABINOIDS, MS, UR RFX
Cannabinoids Confirmation: POSITIVE
Carboxy-THC: 6 ng/mg{creat}

## 2024-03-01 DIAGNOSIS — Z419 Encounter for procedure for purposes other than remedying health state, unspecified: Secondary | ICD-10-CM | POA: Diagnosis not present

## 2024-03-03 ENCOUNTER — Other Ambulatory Visit: Payer: Self-pay

## 2024-03-22 NOTE — Progress Notes (Deleted)
    SUBJECTIVE:   CHIEF COMPLAINT: establish care HPI:   Sandra Matthews is a 28 y.o.  with history notable for mental health condition presenting for follow up.   PERTINENT  PMH / PSH/Family/Social History : ***  OBJECTIVE:   There were no vitals taken for this visit.  Today's weight:  Review of prior weights: There were no vitals filed for this visit.  ***  ASSESSMENT/PLAN:  Assessment & Plan     Otho Blitz, MD  Family Medicine Teaching Service  [facility]

## 2024-03-23 ENCOUNTER — Ambulatory Visit: Admitting: Family Medicine

## 2024-03-23 DIAGNOSIS — F1190 Opioid use, unspecified, uncomplicated: Secondary | ICD-10-CM

## 2024-03-25 ENCOUNTER — Telehealth: Payer: Self-pay

## 2024-03-25 NOTE — Telephone Encounter (Signed)
 Attempted to call patient X4 and on home phone. Scheduled with Dr. Rumball tomorrow. Has [facility] information. Will try again later and if does not answer, ask for welfare check.  Otho Blitz, MD  Family Medicine Teaching Service

## 2024-03-25 NOTE — Telephone Encounter (Addendum)
 Spoke with mother. Received updated number to call. [phone]. Attempted to call X2.  Mother reports she just spoke with patient, patient is doing okay but mom worried about mood.  Patient will check mychart for visit tomorrow.  Otho Blitz, MD  Family Medicine Teaching Service

## 2024-03-25 NOTE — Telephone Encounter (Signed)
 This patient called stating she messed her appointment Tuesday and she is needing to see her doctor today. She stated she is struggling with anxiety and depression and mental issues really bad today, I stated she would have to go to [facility] behavioral urgent care. She stated she can't do that because they will want to keep her and she has 4 kids at home. I spoke with doctor Pray and she stated that it would be the same policy we can not get her in for a sick visit if she was not yet a patient here but stated to message Dr. Bevin Bucks to see your input.   Please let me know if she follows in our policy if patient no shows they can not be a patient here or if you would like for me to reschedule her.   Thanks!

## 2024-03-25 NOTE — Telephone Encounter (Signed)
 Called [employer] for Welfare check as mother and front desk staff concerned about patient's mood and mental health--- unable to reach patient despite multiple attempts

## 2024-03-25 NOTE — Progress Notes (Deleted)
    SUBJECTIVE:   CHIEF COMPLAINT / HPI: suboxone  f/u  Suboxone  care - here to establish care with Dr. Bevin Bucks. - previously saw [facility] and doing well on suboxone  8-2. S/p vaginal delivery 03/2023 and needing new PCP.  - still with some cocaine  use - has upcoming appt with [facility] for mental health  Welfare    OBJECTIVE:   There were no vitals taken for this visit.  ***  ASSESSMENT/PLAN:   No problem-specific Assessment & Plan notes found for this encounter.     Kandis Ormond, DO

## 2024-03-26 ENCOUNTER — Ambulatory Visit: Admitting: Family Medicine

## 2024-03-26 NOTE — Telephone Encounter (Signed)
 Patient calls nurse line to reschedule new patient apt.   Patient has no showed x2 new patient apts. She reports she was unaware she had an apt scheduled for today.   Advised will need to confirm scheduling.   She asks I call her back at [phone]

## 2024-03-29 ENCOUNTER — Other Ambulatory Visit: Payer: Self-pay | Admitting: Family Medicine

## 2024-03-29 ENCOUNTER — Encounter: Payer: Self-pay | Admitting: Family Medicine

## 2024-03-29 ENCOUNTER — Other Ambulatory Visit: Payer: Self-pay

## 2024-03-29 ENCOUNTER — Other Ambulatory Visit (HOSPITAL_COMMUNITY): Payer: Self-pay

## 2024-03-29 DIAGNOSIS — F1190 Opioid use, unspecified, uncomplicated: Secondary | ICD-10-CM

## 2024-03-29 MED ORDER — BUPRENORPHINE HCL-NALOXONE HCL 8-2 MG SL FILM
1.0000 | ORAL_FILM | Freq: Four times a day (QID) | SUBLINGUAL | 0 refills | Status: AC
Start: 2024-03-29 — End: 2024-04-08
  Filled 2024-03-29: qty 40, 10d supply, fill #0

## 2024-03-29 NOTE — Progress Notes (Signed)
 Suboxone  refill, see mychart message

## 2024-03-29 NOTE — Telephone Encounter (Signed)
Patient has been scheduled for 6/13

## 2024-03-29 NOTE — Telephone Encounter (Signed)
 Patient is requesting an urgent refill on her  Suboxone  , patient state she is having withdraws

## 2024-03-30 NOTE — Progress Notes (Signed)
   SUBJECTIVE:   CHIEF COMPLAINT / HPI: suboxone  f/u  Suboxone  care - here to establish care with Dr. Bevin Bucks. - previously saw [facility] clinic and doing well on suboxone  8-2. S/p vaginal delivery 03/2023 and needing new PCP.  - still with some cocaine  use, attributes more to social setting than true cravings. - feels opioid cravings are managed well with suboxone , needs refill.  - has upcoming appt with [facility] for mental health  Anxiety, Depression - long standing history - Medications: citalopram  (doesn't feel it is working). Has previously taken some of friend's medication but unsure of the name - Taking: maybe missing 2-3 days per week.  - Counseling: no - has upcoming appt with [facility] but unsure when  Fatigue - sleeping ok, states she sleeps all day and all night - tired when wakens - snores some, no report of apnea. Sleeps with her children. - never had sleep study. Some morning headaches.  - prior h/o anemia on chart review  OBJECTIVE:   BP 116/60   Pulse 87   Ht 5' 2 (1.575 m)   Wt 131 lb 9.6 oz (59.7 kg)   SpO2 97%   BMI 24.07 kg/m   Gen: well appearing, in NAD Card: RRR Lungs: CTAB Ext: WWP, no edema   ASSESSMENT/PLAN:   Opioid use disorder Doing well on suboxone , refill provided. Reviewed recent UDS and PDMP.  Anxiety and depression Has upcoming appt with [facility], anticipate they will take over medication management. Discussed potentially increasing citalopram  but recommend consistent daily use for a few weeks before determining. Therapy resources given today.   Fatigue Unclear etiology. Anxiety and depression likely contributing. Will obtain labs, last Hb anemic. Will also refer for sleep study given snoring, encouraged to ask for home study given lack of childcare and some transportation issues.    F/u 2 weeks for anxiety/depression if not established with [facility] by that time.  Kandis Ormond, DO

## 2024-04-01 DIAGNOSIS — Z419 Encounter for procedure for purposes other than remedying health state, unspecified: Secondary | ICD-10-CM | POA: Diagnosis not present

## 2024-04-02 ENCOUNTER — Ambulatory Visit (INDEPENDENT_AMBULATORY_CARE_PROVIDER_SITE_OTHER): Admitting: Family Medicine

## 2024-04-02 ENCOUNTER — Encounter: Payer: Self-pay | Admitting: Family Medicine

## 2024-04-02 ENCOUNTER — Other Ambulatory Visit (HOSPITAL_COMMUNITY): Payer: Self-pay

## 2024-04-02 ENCOUNTER — Other Ambulatory Visit: Payer: Self-pay | Admitting: Family Medicine

## 2024-04-02 VITALS — BP 116/60 | HR 87 | Ht 62.0 in | Wt 131.6 lb

## 2024-04-02 DIAGNOSIS — F32A Depression, unspecified: Secondary | ICD-10-CM

## 2024-04-02 DIAGNOSIS — R0683 Snoring: Secondary | ICD-10-CM

## 2024-04-02 DIAGNOSIS — F419 Anxiety disorder, unspecified: Secondary | ICD-10-CM | POA: Diagnosis not present

## 2024-04-02 DIAGNOSIS — F1190 Opioid use, unspecified, uncomplicated: Secondary | ICD-10-CM

## 2024-04-02 DIAGNOSIS — R5383 Other fatigue: Secondary | ICD-10-CM | POA: Diagnosis not present

## 2024-04-02 MED ORDER — BUPRENORPHINE HCL-NALOXONE HCL 8-2 MG SL FILM
1.0000 | ORAL_FILM | Freq: Four times a day (QID) | SUBLINGUAL | 0 refills | Status: DC
Start: 2024-04-08 — End: 2024-05-18
  Filled 2024-04-02 – 2024-04-10 (×2): qty 120, 30d supply, fill #0

## 2024-04-02 NOTE — Patient Instructions (Addendum)
 It was great to see you!  Our plans for today:  - I sent a refill of your suboxone  to your pharmacy.  - We are referring you to Sleep Medicine for a sleep study. Let us  know if you don't hear about an appointment in the next few weeks.  - Call [facility] to see when your appointment is. [facility] [address], [place], [place] [address]   [phone] - Make sure you take your citalopram  consistently for the next few weeks. If you are still having trouble come back to see us  in 2 weeks.   We are checking some labs today, we will release these results to your MyChart.  Take care and seek immediate care sooner if you develop any concerns.   Dr. Prarthana Parlin  Therapy and Counseling Resources Most providers on this list will take Medicaid. Patients with commercial insurance or Medicare should contact their insurance company to get a list of in network providers.  [facility] (takes children) Location 1: [address], [address] [place], [place] [address] Location 2: [address] [place], [place] [address] [phone]   [facility] (spanish speaking therapist available)(habla espanol)(take medicare and medicaid)  [address], [place], [place] [address], [place]  [email or web address] [phone]  [facility]:Psychiatry and Counseling [address] [facility] [place] [place], [place] [address] [phone]  [facility]   [address] Suite [place], [place] [address]      [phone]  [facility] (spanish available) [address]  [place], [place] [address] [phone]  [facility] (take [facility] and medicare) [address], [address]  [place], [place] [address]       [phone]     [facility] (virtual only) [phone]  [facility] [address] [place], [place] [phone]  [facility]:  [address] [place]  [phone]  [facility]:  [address] [place] [phone]  [facility] (under & uninsured) [address]   [place] [phone]    [email or web address]    [facility] [address]  [place]    [phone]  [facility]  [facility] -[address]     Phone:  [phone]     [facility]-  [address]  [phone]   [facility] [address]      [place], [place] [phone]  [facility]: [address] [place], [place], [place]  [phone]   [facility] (Spanish therapist) [email or web address]  [address] [place]  [address]   [place] [address]    [phone]    [facility]   [address],  [place], [place]  [phone]   [facility]  [address] [place]  [phone]  [facility]  [address] [place], [place]        [phone]  Open Access/Walk In Clinic under & uninsured  [facility]  [address] [place], [place] [phone] Crisis [phone]  [facility] the [address],  (Spanish)   [address] , [place]: [phone]) 8:30 - 12; 1 - 2:30  [facility] [employer],  [address] [place], [place]    [phone]):8:30 - 12; 2 - 3PM  [facility] [employer],  [address],  [place]  [place]; [phone]):   Mon - Fri 8 AM - 5 PM  [facility] [address] [place]  MWF 12:30 to 3:00 or call to schedule an appointment  [phone]  Specific Provider options Psychology Today  [email or web address].psychologytoday.[email or web address]/us  click on find a therapist  enter your zip code left side and select or tailor a therapist for your specific need.   [facility] Provider Directory [email or web address]  (Medicaid)   Follow all drop  down to find a provider  Social Support program [facility] [phone] or PhotoSolver.pl [address], [place], [place] Recovery support and educational   24- Hour Availability:   [facility]  [address] [place], [place] [phone] Crisis [phone]  [facility] of the [facility] [phone]  [place] [facility]  [phone]   [facility]  [phone] (after hours)  Therapeutic Alternative/Mobile Crisis   9030548243  [place]  National Suicide Hotline  (330)140-4947 Derrel Flies)  Call 911 or go to emergency room  [facility]  [phone]);  [place] and [employer]  [phone]); [place], [place], [place], [place], [place], [place], [place]

## 2024-04-02 NOTE — Assessment & Plan Note (Signed)
 Unclear etiology. Anxiety and depression likely contributing. Will obtain labs, last Hb anemic. Will also refer for sleep study given snoring, encouraged to ask for home study given lack of childcare and some transportation issues.

## 2024-04-02 NOTE — Assessment & Plan Note (Addendum)
 Has upcoming appt with [facility], anticipate they will take over medication management. Discussed potentially increasing citalopram  but recommend consistent daily use for a few weeks before determining. Therapy resources given today.

## 2024-04-02 NOTE — Assessment & Plan Note (Addendum)
 Doing well on suboxone , refill provided. Reviewed recent UDS and PDMP.

## 2024-04-05 ENCOUNTER — Other Ambulatory Visit (HOSPITAL_COMMUNITY): Payer: Self-pay

## 2024-04-05 ENCOUNTER — Other Ambulatory Visit: Payer: Self-pay

## 2024-04-06 ENCOUNTER — Other Ambulatory Visit (HOSPITAL_COMMUNITY): Payer: Self-pay

## 2024-04-08 ENCOUNTER — Other Ambulatory Visit (HOSPITAL_COMMUNITY): Payer: Self-pay

## 2024-04-08 ENCOUNTER — Other Ambulatory Visit: Payer: Self-pay | Admitting: Family Medicine

## 2024-04-08 DIAGNOSIS — F1190 Opioid use, unspecified, uncomplicated: Secondary | ICD-10-CM

## 2024-04-10 ENCOUNTER — Other Ambulatory Visit (HOSPITAL_BASED_OUTPATIENT_CLINIC_OR_DEPARTMENT_OTHER): Payer: Self-pay

## 2024-04-10 ENCOUNTER — Other Ambulatory Visit (HOSPITAL_COMMUNITY): Payer: Self-pay

## 2024-04-12 ENCOUNTER — Other Ambulatory Visit (HOSPITAL_COMMUNITY): Payer: Self-pay

## 2024-04-12 ENCOUNTER — Other Ambulatory Visit: Payer: Self-pay

## 2024-04-15 ENCOUNTER — Other Ambulatory Visit (HOSPITAL_COMMUNITY): Payer: Self-pay

## 2024-04-27 ENCOUNTER — Ambulatory Visit: Admitting: Family Medicine

## 2024-05-01 DIAGNOSIS — Z419 Encounter for procedure for purposes other than remedying health state, unspecified: Secondary | ICD-10-CM | POA: Diagnosis not present

## 2024-05-18 ENCOUNTER — Other Ambulatory Visit (HOSPITAL_COMMUNITY): Payer: Self-pay

## 2024-05-18 ENCOUNTER — Other Ambulatory Visit: Payer: Self-pay | Admitting: Family Medicine

## 2024-05-18 DIAGNOSIS — F1190 Opioid use, unspecified, uncomplicated: Secondary | ICD-10-CM

## 2024-05-18 MED ORDER — BUPRENORPHINE HCL-NALOXONE HCL 8-2 MG SL FILM
1.0000 | ORAL_FILM | Freq: Four times a day (QID) | SUBLINGUAL | 0 refills | Status: DC
  Filled 2024-05-18 – 2024-05-20 (×2): qty 120, 30d supply, fill #0

## 2024-05-20 ENCOUNTER — Other Ambulatory Visit (HOSPITAL_COMMUNITY): Payer: Self-pay

## 2024-05-20 ENCOUNTER — Other Ambulatory Visit: Payer: Self-pay

## 2024-05-25 ENCOUNTER — Other Ambulatory Visit (HOSPITAL_COMMUNITY): Payer: Self-pay

## 2024-05-27 ENCOUNTER — Ambulatory Visit

## 2024-06-01 ENCOUNTER — Ambulatory Visit: Admitting: Family Medicine

## 2024-06-01 DIAGNOSIS — Z419 Encounter for procedure for purposes other than remedying health state, unspecified: Secondary | ICD-10-CM | POA: Diagnosis not present

## 2024-06-17 ENCOUNTER — Other Ambulatory Visit (HOSPITAL_COMMUNITY): Payer: Self-pay

## 2024-06-17 ENCOUNTER — Other Ambulatory Visit: Payer: Self-pay | Admitting: Family Medicine

## 2024-06-17 DIAGNOSIS — F1190 Opioid use, unspecified, uncomplicated: Secondary | ICD-10-CM

## 2024-06-17 MED ORDER — BUPRENORPHINE HCL-NALOXONE HCL 8-2 MG SL FILM
1.0000 | ORAL_FILM | Freq: Four times a day (QID) | SUBLINGUAL | 0 refills | Status: DC
  Filled 2024-06-17: qty 40, 10d supply, fill #0

## 2024-06-18 ENCOUNTER — Other Ambulatory Visit (HOSPITAL_COMMUNITY): Payer: Self-pay

## 2024-07-01 ENCOUNTER — Other Ambulatory Visit: Payer: Self-pay

## 2024-07-01 NOTE — Progress Notes (Signed)
 Patient no longer on medication, disenrolled.

## 2024-07-02 DIAGNOSIS — Z419 Encounter for procedure for purposes other than remedying health state, unspecified: Secondary | ICD-10-CM | POA: Diagnosis not present

## 2024-07-07 ENCOUNTER — Ambulatory Visit: Admitting: Family Medicine

## 2024-07-07 DIAGNOSIS — F411 Generalized anxiety disorder: Secondary | ICD-10-CM | POA: Diagnosis not present

## 2024-07-07 DIAGNOSIS — F331 Major depressive disorder, recurrent, moderate: Secondary | ICD-10-CM | POA: Diagnosis not present

## 2024-07-08 ENCOUNTER — Ambulatory Visit: Admitting: Family Medicine

## 2024-07-08 ENCOUNTER — Other Ambulatory Visit (HOSPITAL_COMMUNITY): Payer: Self-pay

## 2024-07-08 ENCOUNTER — Telehealth: Payer: Self-pay

## 2024-07-08 VITALS — BP 114/81 | HR 82 | Ht 62.0 in | Wt 127.2 lb

## 2024-07-08 DIAGNOSIS — F32A Depression, unspecified: Secondary | ICD-10-CM

## 2024-07-08 DIAGNOSIS — F419 Anxiety disorder, unspecified: Secondary | ICD-10-CM

## 2024-07-08 DIAGNOSIS — F1190 Opioid use, unspecified, uncomplicated: Secondary | ICD-10-CM

## 2024-07-08 MED ORDER — BUPRENORPHINE HCL-NALOXONE HCL 8-2 MG SL FILM
1.0000 | ORAL_FILM | Freq: Four times a day (QID) | SUBLINGUAL | 0 refills | Status: DC
  Filled 2024-07-08 – 2024-07-12 (×5): qty 120, 30d supply, fill #0
  Filled 2024-07-12 (×2): qty 12, 3d supply, fill #0

## 2024-07-08 MED ORDER — HYDROXYZINE HCL 25 MG PO TABS
25.0000 mg | ORAL_TABLET | Freq: Three times a day (TID) | ORAL | 2 refills | Status: AC | PRN
  Filled 2024-07-08: qty 90, 30d supply, fill #0

## 2024-07-08 NOTE — Patient Instructions (Signed)
 It was wonderful to see you today.  Please bring ALL of your medications with you to every visit.   Today we talked about:  Suboxone  - I have refilled your suboxone . You will need a visit within a month with doctor brown for your next refill and to follow up your anxiety and depression. I have refilled your hydoxyzine but I believe you would benefit from a medication that would truly treat your underlying mood disorder instead of just treating symptoms in the moment. I highly recommend you also start therapy at the same time.   Please let us  know if you have any other paranoia or hallucinations.   For a nexplanon  removal please make an appointment at the front desk for a procedure visit.    Thank you for choosing [facility].   Please call [phone] with any questions about today's appointment.  Please be sure to schedule follow up at the front desk before you leave today.   Areta Saliva, MD  Family Medicine

## 2024-07-08 NOTE — Telephone Encounter (Signed)
 Received call from patient stating that Suboxone  prescription is requiring PA.   Spoke with pharmacist who confirmed this.   They have sent Covermymeds key to our office for initiation of PA.   We will need Dr. Uvaldo signed office note prior to submitting PA. Office note should include documentation of medical necessity and plan for medication.   Once note is complete, please route to [place] for assistance with PA.   Chiquita JAYSON English, RN

## 2024-07-08 NOTE — Progress Notes (Addendum)
 SUBJECTIVE:   CHIEF COMPLAINT / HPI:   Suboxone  follow up  Patient is doing well on 8-2 suboxone  films. She had been given only a 10 day supply recently due to multiple no show appointments. Patient has needed 4 films per day since 02/24/2024 office visit due to increased cravings. She has been stable on this dose. Patient says this dose has been able to keep her symptoms well controlled. She has continued to not use any recreational substances for 2 years.   Anxiety  Patient says her anxiety has been getting in the way of her life. She did speak about this at her last appointment at [facility] and did go to appointment at [facility] since then. She says she felt frustrated with her appointment and felt like she was put on medication that made her symptoms worse previously. She says when she was on citalopram , buspar , and hydroxyzine  she had an episode of paranoia and hallucinations. She says she did not have these symptoms when she was just on hydroxyzine  previously. Says she was not using any substances at this time. Says she has been off of substances for 2 years. Does not think she could have been withdrawing from any substances at that time either. Patient says she is scared to try more medications because she does not want to feel worse. She is concerned about having any of the paranoia or hallucinations as she is in the process of regaining custody of her child and does not want to have any symptoms that could complicate that process. Patient denies any other times of her life that she may have had paranoia or hallucinations. She does also say she tried a medication from her family member that was clonazepam that was helpful for her anxiety. She wants to start this medication. Patient says she did not want to continue with [facility] and is not interested in therapy at this time.   PERTINENT  PMH / PSH: Substance use, Anxiety   OBJECTIVE:   BP 114/81   Pulse 82   Ht 5' 2 (1.575 m)   Wt 127 lb 3.2 oz  (57.7 kg)   SpO2 100%   BMI 23.27 kg/m   General: well appearing, in no acute distress  CV: well perfused  Resp: normal work of breathing  Psyc: conversant pleasant, not responding to any internal stimuli  ASSESSMENT/PLAN:   Assessment & Plan Opioid use disorder Stable on current dose. Has significantly decreased opioid cravings and kept patient away from substance use. Opioid use disorder is well controlled with suboxone  at 4 films per day. She has been stable on this dose since 02/24/2024. Her other substance use disorders are also controlled. She is not on any other prescriptions for controlled substances.  - Refilled 120 films of 8-2 for one month (1 film 4 times daily)  - Informed patient she would need to follow up within a month for next refill  Anxiety and depression Anxiety does seem to be significant given GAD 7 score of 14. However, patient is not interested in other medication when counseled regarding SSRI or SNRI. Patient could have MDD with psychotic features given the history she gave; however, no psychotic features since stopping citalopram  or buspar . Citalopram  and buspar  have low likelihood or causing psychotic features and patient was interested in benzodiazepine medication.  - Counseled patient against benzodiazepine given would not treat underlying disease process and would be a bad fit for patient given history of addiction  - Refilled hydroxyzine  after discussion  with patient and shared decision making  - counseled patient on importance of also starting therapy. Patient says she will think about it.  - Follow up in one month      Areta Saliva, MD [facility]

## 2024-07-08 NOTE — Assessment & Plan Note (Signed)
 Stable on current dose. Has significantly decreased opioid cravings and kept patient away from substance use. Opioid use disorder is well controlled with suboxone  at 4 films per day. She has been stable on this dose since 02/24/2024. Her other substance use disorders are also controlled. She is not on any other prescriptions for controlled substances.  - Refilled 120 films of 8-2 for one month (1 film 4 times daily)  - Informed patient she would need to follow up within a month for next refill

## 2024-07-08 NOTE — Assessment & Plan Note (Signed)
 Anxiety does seem to be significant given GAD 7 score of 14. However, patient is not interested in other medication when counseled regarding SSRI or SNRI. Patient could have MDD with psychotic features given the history she gave; however, no psychotic features since stopping citalopram  or buspar . Citalopram  and buspar  have low likelihood or causing psychotic features and patient was interested in benzodiazepine medication.  - Counseled patient against benzodiazepine given would not treat underlying disease process and would be a bad fit for patient given history of addiction  - Refilled hydroxyzine  after discussion with patient and shared decision making  - counseled patient on importance of also starting therapy. Patient says she will think about it.  - Follow up in one month

## 2024-07-09 ENCOUNTER — Encounter (HOSPITAL_COMMUNITY): Payer: Self-pay

## 2024-07-09 ENCOUNTER — Other Ambulatory Visit (HOSPITAL_COMMUNITY): Payer: Self-pay

## 2024-07-09 ENCOUNTER — Encounter: Payer: Self-pay | Admitting: Family Medicine

## 2024-07-09 NOTE — Telephone Encounter (Signed)
 Called [facility] Medicaid and spoke with [place].   She advised that there is not an additional form that was faxed, only denial letter with instructions for appeal.   [place] advised that we could submit a reconsideration request via fax at [phone].   We would need to include documentation in chart notes regarding reason for quantity limit exception and that prescriber has reviewed the controlled substances reporting database prior to writing prescription to ensure that concomitant opioid use is not occurring. Quantity limit for medicaid is 3 per day without provider indicating reason for additional films.   Once OV notes have this included, this can be faxed to insurance company for reconsideration.   Chiquita JAYSON English, RN

## 2024-07-09 NOTE — Telephone Encounter (Signed)
 Pharmacy Patient Advocate Encounter  Received notification from [facility] MEDICAID that Prior Authorization for SUBOXONE  has been DENIED.  Full denial letter will be uploaded to the media tab. See denial reason below.     Im honestly not sure about this denial, it wasn't in the PA clinical questions... maybe some kind of documentation can be submitted (maybe a chart note statement or written statement?).  PA #/Case ID/Reference #: [id number]

## 2024-07-09 NOTE — Telephone Encounter (Signed)
 Prior authorization submitted for SUBOXONE  to [facility] MEDICAID via Latent.   Key: [id number]

## 2024-07-12 ENCOUNTER — Other Ambulatory Visit: Payer: Self-pay

## 2024-07-12 ENCOUNTER — Other Ambulatory Visit (HOSPITAL_COMMUNITY): Payer: Self-pay

## 2024-07-12 ENCOUNTER — Telehealth: Payer: Self-pay | Admitting: Family Medicine

## 2024-07-12 MED ORDER — BUPRENORPHINE HCL-NALOXONE HCL 8-2 MG SL FILM
1.0000 | ORAL_FILM | Freq: Three times a day (TID) | SUBLINGUAL | 0 refills | Status: DC
  Filled 2024-07-12 – 2024-07-14 (×2): qty 90, 30d supply, fill #0

## 2024-07-12 NOTE — Telephone Encounter (Signed)
 Patient came into clinic voicing frustration with PA/appeal.   Advised her that we have been doing everything that has been requested by her insurance.   Patient does not understand why this is now an issue as she has not had any changes with her insurance.   Tobey was able to print off Appointment of Representative form. This has been placed in Boyina's box for signature.   Patient advised that she would be interested in receiving rx for 3 SL tablets while we wait on appeal.   Good call back for patient is [phone].  Chiquita JAYSON English, RN

## 2024-07-12 NOTE — Telephone Encounter (Signed)
 I have sent in a prescription for 8-2 suboxone  3 films a day for a 30 day supply so that this patient has some medication while the appropriate prescription for 4 films a day goes through the prior authorization and appeals process.

## 2024-07-12 NOTE — Telephone Encounter (Signed)
 Attempted to call patient to ask her if she would like to do a refill of suboxone  3 films a day while we are processing the appeal for 4 films a day. Unfortunately patient did not answer the phone. I left a HIPAA compliant VM.

## 2024-07-12 NOTE — Telephone Encounter (Signed)
 Confirmed with [place], provider should complete section 1 of the paperwork.   Paperwork in [employer].   Sandra JAYSON English, RN

## 2024-07-12 NOTE — Telephone Encounter (Signed)
 PA / Appeal ref # [id number]

## 2024-07-12 NOTE — Telephone Encounter (Signed)
 Patient calls nurse line regarding PA on suboxone .   Message sent to [facility], reconsideration request was denied.   Patient reports that insurance rep advised submitting appeal.   You can submit an expedited appeal by calling [phone].   I have placed this paperwork in your box.   Chiquita JAYSON English, RN

## 2024-07-12 NOTE — Telephone Encounter (Signed)
 Im so sorry I had rec'd the appeal info fax but deleted it thinking it was a duplicate since yall had already submitted it. However I just received a fax stating:

## 2024-07-13 ENCOUNTER — Other Ambulatory Visit (HOSPITAL_COMMUNITY): Payer: Self-pay

## 2024-07-13 NOTE — Telephone Encounter (Signed)
 Pharmacy Patient Advocate Encounter  Received notification from [facility] MEDICAID that Prior Authorization for SUBOXONE  has been APPROVED from 07/09/24 to 07/12/25

## 2024-07-14 ENCOUNTER — Other Ambulatory Visit (HOSPITAL_COMMUNITY): Payer: Self-pay

## 2024-07-14 NOTE — Telephone Encounter (Signed)
   Called pharmacy with approval.   Veronda Prude, RN

## 2024-07-19 ENCOUNTER — Ambulatory Visit

## 2024-08-06 NOTE — Progress Notes (Deleted)
    SUBJECTIVE:   CHIEF COMPLAINT: anxiety HPI:   Sandra Matthews is a 28 y.o.  with history notable for OUS on suboxone  and anxiety presenting for follow up.   Discussed the use of AI scribe software for clinical note transcription with the patient, who gave verbal consent to proceed.  History of Present Illness      PERTINENT  PMH / PSH/Family/Social History : ***  OBJECTIVE:   There were no vitals taken for this visit.  Today's weight:  Review of prior weights: There were no vitals filed for this visit.  ***  ASSESSMENT/PLAN:   Assessment & Plan Opioid use disorder  Anxiety and depression     Suzann Daring, MD  Family Medicine Teaching Service  [facility]

## 2024-08-09 ENCOUNTER — Ambulatory Visit: Admitting: Family Medicine

## 2024-08-09 DIAGNOSIS — F1190 Opioid use, unspecified, uncomplicated: Secondary | ICD-10-CM

## 2024-08-09 DIAGNOSIS — F32A Depression, unspecified: Secondary | ICD-10-CM

## 2024-08-16 ENCOUNTER — Telehealth: Payer: Self-pay | Admitting: Family Medicine

## 2024-08-16 ENCOUNTER — Other Ambulatory Visit: Payer: Self-pay | Admitting: Family Medicine

## 2024-08-16 DIAGNOSIS — F1190 Opioid use, unspecified, uncomplicated: Secondary | ICD-10-CM

## 2024-08-16 NOTE — Telephone Encounter (Signed)
 Pt request refill of: Suboxone   Name of Medication(s):  Suboxone  Last date of OV:  07/08/2024 Pharmacy:  [facility]  Will route refill request to Team CMA.  Discussed with patient policy to call pharmacy for future refills.  Also, discussed refills may take up to 48 hours to approve or deny.  [name]

## 2024-08-17 ENCOUNTER — Encounter (HOSPITAL_COMMUNITY): Payer: Self-pay

## 2024-08-17 ENCOUNTER — Other Ambulatory Visit: Payer: Self-pay

## 2024-08-17 ENCOUNTER — Other Ambulatory Visit (HOSPITAL_COMMUNITY): Payer: Self-pay

## 2024-08-17 ENCOUNTER — Telehealth (INDEPENDENT_AMBULATORY_CARE_PROVIDER_SITE_OTHER): Admitting: Family Medicine

## 2024-08-17 DIAGNOSIS — F1190 Opioid use, unspecified, uncomplicated: Secondary | ICD-10-CM

## 2024-08-17 MED ORDER — BUPRENORPHINE HCL-NALOXONE HCL 8-2 MG SL FILM
1.0000 | ORAL_FILM | Freq: Four times a day (QID) | SUBLINGUAL | 0 refills | Status: DC
  Filled 2024-08-17: qty 120, 30d supply, fill #0

## 2024-08-17 NOTE — Telephone Encounter (Signed)
 Called patient with no answer. Will send mychart message.   Chiquita JAYSON English, RN

## 2024-08-17 NOTE — Progress Notes (Signed)
 Virtual Visit via Telephone Note  I connected with Sandra Matthews on 08/17/24 at  2:10 PM EDT by telephone and verified that I am speaking with the correct person using two identifiers.  Location: Patient: Sandra Matthews - home Provider: Izetta Nap, DO - [facility]   I discussed the limitations, risks, security and privacy concerns of performing an evaluation and management service by telephone and the availability of in person appointments. I also discussed with the patient that there may be a patient responsible charge related to this service. The patient expressed understanding and agreed to proceed.   History of Present Illness: Medication refill - OUD On Suboxone  8-2 films x 4 per day. Stable on current dose since 02/24/2024. Last UDS in 02/2024, + for THC and cocaine .  Taking 4 films a day but only got prescribed 3 times daily due to insurance issue.  Had been on 4 films per day previously without issue. Ran out of medication on 10/25, tried to take less amounts to stretch it out. Having diarrhea and sweats. Denies substance use.    Observations/Objective: NAD, speaking in full sentences  Assessment and Plan: Assessment & Plan Opioid use disorder Due to insurance prior Auth if she was only prescribed Suboxone  90 mg daily but has been taking 120 mg daily as previously prescribed.  Has been out of the medication a couple days, experiencing withdrawal symptoms.  PDMP reviewed and appropriate and patient denies concurrent substance use.  Will refill x 1 month and advised to call and schedule up with PCP for next refill. - Refill Suboxone  8-2 x 4 films daily, called and verified with the pharmacy would be available for pickup today    Follow Up Instructions: - Schedule in person appointment with PCP in the next month   I discussed the assessment and treatment plan with the patient. The patient was provided an opportunity to ask questions and all were answered. The patient  agreed with the plan and demonstrated an understanding of the instructions.   The patient was advised to call back or seek an in-person evaluation if the symptoms worsen or if the condition fails to improve as anticipated.  I provided 20 minutes of non-face-to-face time during this encounter.   Izetta Nap, MD

## 2024-08-17 NOTE — Telephone Encounter (Signed)
 Please see separate note--please schedule for visit today--we have many openings. Okay for virtual if transportation issues (please schedule with PGY2/3).  Suzann Daring, MD  Family Medicine Teaching Service

## 2024-08-17 NOTE — Assessment & Plan Note (Signed)
 Due to insurance prior Auth if she was only prescribed Suboxone  90 mg daily but has been taking 120 mg daily as previously prescribed.  Has been out of the medication a couple days, experiencing withdrawal symptoms.  PDMP reviewed and appropriate and patient denies concurrent substance use.  Will refill x 1 month and advised to call and schedule up with PCP for next refill. - Refill Suboxone  8-2 x 4 films daily, called and verified with the pharmacy would be available for pickup today

## 2024-08-17 NOTE — Telephone Encounter (Signed)
 Patient missed last visit. We have many openings today. Please call and schedule.

## 2024-08-31 ENCOUNTER — Ambulatory Visit: Admitting: Family Medicine

## 2024-09-01 DIAGNOSIS — Z419 Encounter for procedure for purposes other than remedying health state, unspecified: Secondary | ICD-10-CM | POA: Diagnosis not present

## 2024-09-10 NOTE — Assessment & Plan Note (Deleted)
 PDMP reviewed Refilled medication  UDS collected

## 2024-09-10 NOTE — Progress Notes (Deleted)
    SUBJECTIVE:   CHIEF COMPLAINT: medicatoins HPI:   Sandra Matthews is a 28 y.o.  with history notable for OUD presenting for follow up.   Discussed the use of AI scribe software for clinical note transcription with the patient, who gave verbal consent to proceed.  History of Present Illness      PERTINENT  PMH / PSH/Family/Social History :  Nexplanon  placed 2024   OBJECTIVE:   There were no vitals taken for this visit.  Today's weight:  Review of prior weights: There were no vitals filed for this visit.  ***  ASSESSMENT/PLAN:   Assessment & Plan Opioid use disorder PDMP reviewed Refilled medication  UDS collected  Anxiety and depression     Suzann Daring, MD  Family Medicine Teaching Service  [facility]

## 2024-09-13 ENCOUNTER — Ambulatory Visit: Admitting: Family Medicine

## 2024-09-13 DIAGNOSIS — F32A Depression, unspecified: Secondary | ICD-10-CM

## 2024-09-13 DIAGNOSIS — F1190 Opioid use, unspecified, uncomplicated: Secondary | ICD-10-CM

## 2024-09-26 ENCOUNTER — Other Ambulatory Visit: Payer: Self-pay | Admitting: Family Medicine

## 2024-09-26 DIAGNOSIS — F1190 Opioid use, unspecified, uncomplicated: Secondary | ICD-10-CM

## 2024-09-27 ENCOUNTER — Encounter (HOSPITAL_COMMUNITY): Payer: Self-pay

## 2024-09-27 ENCOUNTER — Other Ambulatory Visit (HOSPITAL_COMMUNITY): Payer: Self-pay

## 2024-09-27 NOTE — Telephone Encounter (Signed)
 Please call and offer visit today. Okay to be virtual given weather. Next visit must be in person.   Suzann Daring, MD  Family Medicine Teaching Service

## 2024-09-27 NOTE — Telephone Encounter (Signed)
 Called patient x3 on all numbers listed.   Several are no longer working numbers and/or no longer associated with her.   If she calls the office please update her telephone number and schedule a visit for medication refill.

## 2024-09-28 ENCOUNTER — Other Ambulatory Visit (HOSPITAL_COMMUNITY): Payer: Self-pay

## 2024-09-28 ENCOUNTER — Encounter: Payer: Self-pay | Admitting: Family Medicine

## 2024-09-28 ENCOUNTER — Other Ambulatory Visit (HOSPITAL_BASED_OUTPATIENT_CLINIC_OR_DEPARTMENT_OTHER): Payer: Self-pay

## 2024-09-28 ENCOUNTER — Ambulatory Visit: Admitting: Family Medicine

## 2024-09-28 DIAGNOSIS — F1190 Opioid use, unspecified, uncomplicated: Secondary | ICD-10-CM

## 2024-09-28 MED ORDER — BUPRENORPHINE HCL-NALOXONE HCL 8-2 MG SL FILM
1.0000 | ORAL_FILM | Freq: Four times a day (QID) | SUBLINGUAL | 0 refills | Status: DC
  Filled 2024-09-28: qty 120, 30d supply, fill #0

## 2024-09-28 NOTE — Assessment & Plan Note (Signed)
 Represcribed Suboxone  8-2 mg film x 4 daily for 1 month.  Unable to collect urine tox screen today due to patient urinating prior to getting urine cup.  PDMP reviewed and appropriate and patient denies any concurrent substance use today. - Suboxone  8-2 mg x 4 films daily prescribed - Urine Tox Screen on 12/11 with nexplanon  removal, patient is aware - PCP follow up scheduled 01/23 to discuss OUD contract and refills

## 2024-09-28 NOTE — Progress Notes (Signed)
    SUBJECTIVE:   CHIEF COMPLAINT / HPI:   Suboxone  Refill On Suboxone  8-2 films x 4 per day. Stable on current dose since 02/24/2023. Last UDS in 02/2024, + for THC and cocaine . Last dose 09/25/24.   Ran out of medication on 12/6.  Was unable to come in sooner for an appointment since she is trying to get custody of her children and has a lot of stress in her life right now.  Having some withdrawal symptoms including diarrhea, sweats, and stomach pains. Denies substance use.   PERTINENT  PMH / PSH: OUD, Anxiety, Depression  OBJECTIVE:   BP 130/80   Pulse 88   Ht 5' 1 (1.549 m)   Wt 129 lb (58.5 kg)   SpO2 100%   BMI 24.37 kg/m   General: Awake and Alert in NAD HEENT: NCAT. Sclera anicteric. No rhinorrhea. Cardiovascular: RRR. No M/R/G Respiratory: CTAB, normal WOB on RA. No wheezing, crackles, rhonchi, or diminished breath sounds. Abdomen: Soft, non-tender, non-distended. Bowel sounds normoactive Extremities: Able to move all extremities.  Skin: Warm and dry. Neuro: A&Ox3. No focal neurological deficits.  ASSESSMENT/PLAN:   Assessment & Plan Opioid use disorder Represcribed Suboxone  8-2 mg film x 4 daily for 1 month.  Unable to collect urine tox screen today due to patient urinating prior to getting urine cup.  PDMP reviewed and appropriate and patient denies any concurrent substance use today. - Suboxone  8-2 mg x 4 films daily prescribed - Urine Tox Screen on 12/11 with nexplanon  removal, patient is aware - PCP follow up scheduled 01/23 to discuss OUD contract and refills   Kathrine Melena, DO [facility]

## 2024-09-28 NOTE — Patient Instructions (Addendum)
 It was great to see you today! Thank you for choosing [facility] for your primary care. Sandra Matthews was seen for suboxone  refill.  Today we addressed: Suboxone  refill - filled for one more month. Please come with a full bladder to your appt on Thursday for nexplanon  removal to get your urine test done.   You should return to our clinic Return in about 2 days (around 09/30/2024) for nexplanon  removal at 3:30 PM. Please arrive 15 minutes before your appointment to ensure smooth check in process.  We appreciate your efforts in making this happen.  Thank you for allowing me to participate in your care, Kathrine Melena, DO 09/28/2024, 3:24 PM PGY-2, [facility]

## 2024-09-30 ENCOUNTER — Ambulatory Visit

## 2024-09-30 NOTE — Progress Notes (Deleted)
° ° °  SUBJECTIVE:   CHIEF COMPLAINT / HPI:   Patient presents for monitoring related to opioid use disorder and to have her nexplanon  removed. She was seen last week to restart her suboxone  treatment.   PERTINENT  PMH / PSH: ***  OBJECTIVE:   There were no vitals taken for this visit.  ***  ASSESSMENT/PLAN:   Assessment & Plan Opioid use disorder  Encounter for surveillance of implantable subdermal contraceptive      Sandra Pinal, DO [facility]

## 2024-10-26 ENCOUNTER — Encounter: Payer: Self-pay | Admitting: Student

## 2024-10-26 ENCOUNTER — Other Ambulatory Visit (HOSPITAL_COMMUNITY): Payer: Self-pay

## 2024-10-26 ENCOUNTER — Ambulatory Visit (INDEPENDENT_AMBULATORY_CARE_PROVIDER_SITE_OTHER): Admitting: Student

## 2024-10-26 VITALS — BP 124/84 | HR 89 | Ht 61.0 in | Wt 127.4 lb

## 2024-10-26 DIAGNOSIS — R5383 Other fatigue: Secondary | ICD-10-CM

## 2024-10-26 DIAGNOSIS — F1121 Opioid dependence, in remission: Secondary | ICD-10-CM | POA: Diagnosis present

## 2024-10-26 DIAGNOSIS — F1190 Opioid use, unspecified, uncomplicated: Secondary | ICD-10-CM

## 2024-10-26 MED ORDER — BUPRENORPHINE HCL-NALOXONE HCL 8-2 MG SL FILM
1.0000 | ORAL_FILM | Freq: Four times a day (QID) | SUBLINGUAL | 0 refills | Status: DC
  Filled 2024-10-26: qty 72, 18d supply, fill #0

## 2024-10-26 NOTE — Assessment & Plan Note (Signed)
-  Refilled Suboxone  until PCP appointment later this month

## 2024-10-26 NOTE — Progress Notes (Deleted)
" ° ° °  SUBJECTIVE:   CHIEF COMPLAINT / HPI:   ***  PERTINENT  PMH / PSH: ***  OBJECTIVE:   BP 124/84   Pulse 89   Ht 5' 1 (1.549 m)   Wt 127 lb 6 oz (57.8 kg)   SpO2 100%   BMI 24.07 kg/m   ***  ASSESSMENT/PLAN:   Assessment & Plan Opioid use disorder, moderate, in sustained remission, dependence (HCC)      Gladis Church, DO [facility] "

## 2024-10-26 NOTE — Progress Notes (Signed)
" ° ° °  SUBJECTIVE:   CHIEF COMPLAINT / HPI:   Opioid use disorder Intermission.  On Suboxone .  Adherent to medication.  Requesting refill.  Has appointment with PCP for OUD contract and refills.  Fatigue Worsening fatigue for 2 weeks.  Trouble sleeping.  Reports uncomfortable sensation throughout body.  Does report recent viral illness with flu, approximately 2 weeks ago.  No other systemic symptoms.  OBJECTIVE:   BP 124/84   Pulse 89   Ht 5' 1 (1.549 m)   Wt 127 lb 6 oz (57.8 kg)   SpO2 100%   BMI 24.07 kg/m    General: NAD, pleasant, Cardio: RRR, no MRG. 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 - Using shared decision making, will screen patient for anemia/metabolic disorders - CBC, TSH, BMP - Follow-up in 2 weeks    Gladis Church, DO [facility] "

## 2024-10-26 NOTE — Patient Instructions (Signed)
 It was great to see you! Thank you for allowing me to participate in your care!   I recommend that you always bring your medications to each appointment as this makes it easy to ensure we are on the correct medications and helps us  not miss when refills are needed.  Our plans for today:  - Follow-up Jan 23 with Dr. Delores - We are checking some labs today, I will call you if they are abnormal will send you a MyChart message or a letter if they are normal.  If you do not hear about your labs in the next 2 weeks please let us  know.  Take care and seek immediate care sooner if you develop any concerns. Please remember to show up 15 minutes before your scheduled appointment time!  Gladis Church, DO [facility]

## 2024-11-11 ENCOUNTER — Ambulatory Visit: Admitting: Family Medicine

## 2024-11-11 ENCOUNTER — Other Ambulatory Visit: Payer: Self-pay

## 2024-11-11 ENCOUNTER — Ambulatory Visit: Payer: Self-pay | Admitting: Family Medicine

## 2024-11-11 ENCOUNTER — Other Ambulatory Visit (HOSPITAL_COMMUNITY): Payer: Self-pay

## 2024-11-11 ENCOUNTER — Encounter: Payer: Self-pay | Admitting: Family Medicine

## 2024-11-11 VITALS — BP 125/81 | HR 92 | Temp 98.2°F | Wt 126.0 lb

## 2024-11-11 DIAGNOSIS — F1190 Opioid use, unspecified, uncomplicated: Secondary | ICD-10-CM

## 2024-11-11 DIAGNOSIS — F1121 Opioid dependence, in remission: Secondary | ICD-10-CM

## 2024-11-11 DIAGNOSIS — R5383 Other fatigue: Secondary | ICD-10-CM | POA: Diagnosis present

## 2024-11-11 LAB — POC SOFIA 2 FLU + SARS ANTIGEN FIA
Influenza A, POC: POSITIVE — AB
Influenza B, POC: NEGATIVE
SARS Coronavirus 2 Ag: NEGATIVE

## 2024-11-11 MED ORDER — BUPRENORPHINE HCL-NALOXONE HCL 8-2 MG SL FILM
1.0000 | ORAL_FILM | Freq: Four times a day (QID) | SUBLINGUAL | 0 refills | Status: AC
  Filled 2024-11-11: qty 120, 30d supply, fill #0

## 2024-11-11 NOTE — Progress Notes (Signed)
" ° ° °  SUBJECTIVE:   CHIEF COMPLAINT / HPI:   Fatigued, body aches, no N/V, but has loose stools, believes this may 2/2 to withdrawal from suboxone . Last time she had suboxone  was maybe 3 days ago. Is taking 4 films a day. Does not have fevers or chills. Has some sweating but otherwise no sick symptoms.   PERTINENT  PMH / PSH: Opiod Use Disorder   OBJECTIVE:   BP 125/81   Pulse 92   Temp 98.2 F (36.8 C) (Oral)   Wt 126 lb (57.2 kg)   SpO2 100%   Breastfeeding No   BMI 23.81 kg/m   General: A&O, NAD HEENT: No sign of trauma, EOM grossly intact Cardiac: RRR, no m/r/g Respiratory: CTAB, normal WOB, no w/c/r GI: Soft, NTTP, non-distended  Extremities: NTTP, no peripheral edema. Neuro: Normal gait, moves all four extremities appropriately. Psych: Appropriate mood and affect   ASSESSMENT/PLAN:   Assessment & Plan Other fatigue Opioid use disorder, moderate, in sustained remission, dependence (HCC) Fatigue with loose stools and sweating. Symptoms consistent with withdrawal since she has missed her suboxone  for a few days. Will order POC Flu/COVID as well. Patient denies any opioid use.  - POC Flu/COVID - prescribed Suboxone  8mg /2mg  4 films daily until PCP follow up - UDS completed today     Gloriann Ogren, MD [facility] "

## 2024-11-11 NOTE — Assessment & Plan Note (Signed)
 Fatigue with loose stools and sweating. Symptoms consistent with withdrawal since she has missed her suboxone  for a few days. Will order POC Flu/COVID as well. Patient denies any opioid use.  - POC Flu/COVID - prescribed Suboxone  8mg /2mg  4 films daily until PCP follow up - UDS completed today

## 2024-11-11 NOTE — Patient Instructions (Signed)
 It was wonderful to see you today.  Please bring ALL of your medications with you to every visit.   Today we talked about:  I represcribed your suboxone , we will get a UDS. Please follow up with your PCP   Thank you for choosing [facility].   Please call [phone] with any questions about today's appointment.  Gloriann Ogren, MD Family Medicine

## 2024-11-12 ENCOUNTER — Ambulatory Visit: Admitting: Family Medicine

## 2024-11-18 LAB — TOXASSURE SELECT 13 (MW), URINE

## 2024-12-07 ENCOUNTER — Ambulatory Visit: Admitting: Family Medicine
